# Patient Record
Sex: Male | Born: 1970 | Race: White | Hispanic: No | Marital: Married | Smoking: Current every day smoker
Health system: Southern US, Community
[De-identification: ages and names within clinical notes are randomized; demographics above are authoritative.]

## PROBLEM LIST (undated history)

## (undated) DIAGNOSIS — Z720 Tobacco use: Secondary | ICD-10-CM

## (undated) DIAGNOSIS — G2581 Restless legs syndrome: Secondary | ICD-10-CM

## (undated) DIAGNOSIS — I2510 Atherosclerotic heart disease of native coronary artery without angina pectoris: Secondary | ICD-10-CM

## (undated) DIAGNOSIS — I10 Essential (primary) hypertension: Secondary | ICD-10-CM

## (undated) DIAGNOSIS — I219 Acute myocardial infarction, unspecified: Secondary | ICD-10-CM

## (undated) DIAGNOSIS — E785 Hyperlipidemia, unspecified: Secondary | ICD-10-CM

## (undated) DIAGNOSIS — F419 Anxiety disorder, unspecified: Secondary | ICD-10-CM

## (undated) HISTORY — DX: Atherosclerotic heart disease of native coronary artery without angina pectoris: I25.10

## (undated) HISTORY — DX: Hyperlipidemia, unspecified: E78.5

---
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
## 2018-11-03 DIAGNOSIS — Z13220 Encounter for screening for lipoid disorders: Secondary | ICD-10-CM | POA: Diagnosis not present

## 2018-11-03 DIAGNOSIS — R030 Elevated blood-pressure reading, without diagnosis of hypertension: Secondary | ICD-10-CM | POA: Diagnosis not present

## 2018-11-03 DIAGNOSIS — F419 Anxiety disorder, unspecified: Secondary | ICD-10-CM | POA: Diagnosis not present

## 2018-11-03 DIAGNOSIS — R002 Palpitations: Secondary | ICD-10-CM | POA: Diagnosis not present

## 2018-11-03 DIAGNOSIS — R071 Chest pain on breathing: Secondary | ICD-10-CM | POA: Diagnosis not present

## 2018-11-03 DIAGNOSIS — R079 Chest pain, unspecified: Secondary | ICD-10-CM | POA: Diagnosis not present

## 2018-11-10 DIAGNOSIS — F419 Anxiety disorder, unspecified: Secondary | ICD-10-CM | POA: Diagnosis not present

## 2018-11-10 DIAGNOSIS — R7301 Impaired fasting glucose: Secondary | ICD-10-CM | POA: Diagnosis not present

## 2018-11-10 DIAGNOSIS — E785 Hyperlipidemia, unspecified: Secondary | ICD-10-CM | POA: Diagnosis not present

## 2018-11-21 ENCOUNTER — Emergency Department (HOSPITAL_COMMUNITY): Payer: Medicaid Other

## 2018-11-21 ENCOUNTER — Encounter (HOSPITAL_COMMUNITY)
Admission: EM | Disposition: A | Discharge status: Home / Self Care | Payer: Self-pay | Source: Home / Self Care | Attending: Thoracic Surgery (Cardiothoracic Vascular Surgery)

## 2018-11-21 ENCOUNTER — Encounter (HOSPITAL_COMMUNITY): Payer: Self-pay | Admitting: Cardiovascular Disease

## 2018-11-21 ENCOUNTER — Inpatient Hospital Stay (HOSPITAL_COMMUNITY)
Admission: EM | Admit: 2018-11-21 | Discharge: 2018-11-29 | DRG: 233 | Disposition: A | Discharge status: Home / Self Care | Payer: Medicaid Other | Attending: Thoracic Surgery (Cardiothoracic Vascular Surgery) | Admitting: Thoracic Surgery (Cardiothoracic Vascular Surgery)

## 2018-11-21 DIAGNOSIS — F17210 Nicotine dependence, cigarettes, uncomplicated: Secondary | ICD-10-CM | POA: Diagnosis present

## 2018-11-21 DIAGNOSIS — Z951 Presence of aortocoronary bypass graft: Secondary | ICD-10-CM | POA: Diagnosis not present

## 2018-11-21 DIAGNOSIS — J9600 Acute respiratory failure, unspecified whether with hypoxia or hypercapnia: Secondary | ICD-10-CM | POA: Diagnosis present

## 2018-11-21 DIAGNOSIS — Z978 Presence of other specified devices: Secondary | ICD-10-CM | POA: Diagnosis not present

## 2018-11-21 DIAGNOSIS — J9601 Acute respiratory failure with hypoxia: Secondary | ICD-10-CM | POA: Diagnosis not present

## 2018-11-21 DIAGNOSIS — I959 Hypotension, unspecified: Secondary | ICD-10-CM | POA: Diagnosis present

## 2018-11-21 DIAGNOSIS — I25110 Atherosclerotic heart disease of native coronary artery with unstable angina pectoris: Secondary | ICD-10-CM | POA: Diagnosis not present

## 2018-11-21 DIAGNOSIS — I2510 Atherosclerotic heart disease of native coronary artery without angina pectoris: Secondary | ICD-10-CM | POA: Diagnosis not present

## 2018-11-21 DIAGNOSIS — J9811 Atelectasis: Secondary | ICD-10-CM | POA: Diagnosis not present

## 2018-11-21 DIAGNOSIS — I469 Cardiac arrest, cause unspecified: Secondary | ICD-10-CM

## 2018-11-21 DIAGNOSIS — I472 Ventricular tachycardia: Secondary | ICD-10-CM | POA: Diagnosis present

## 2018-11-21 DIAGNOSIS — I4891 Unspecified atrial fibrillation: Secondary | ICD-10-CM | POA: Diagnosis not present

## 2018-11-21 DIAGNOSIS — F419 Anxiety disorder, unspecified: Secondary | ICD-10-CM | POA: Diagnosis present

## 2018-11-21 DIAGNOSIS — Z79899 Other long term (current) drug therapy: Secondary | ICD-10-CM | POA: Diagnosis not present

## 2018-11-21 DIAGNOSIS — I499 Cardiac arrhythmia, unspecified: Secondary | ICD-10-CM | POA: Diagnosis not present

## 2018-11-21 DIAGNOSIS — I480 Paroxysmal atrial fibrillation: Secondary | ICD-10-CM | POA: Diagnosis not present

## 2018-11-21 DIAGNOSIS — G2581 Restless legs syndrome: Secondary | ICD-10-CM | POA: Diagnosis present

## 2018-11-21 DIAGNOSIS — I213 ST elevation (STEMI) myocardial infarction of unspecified site: Secondary | ICD-10-CM

## 2018-11-21 DIAGNOSIS — J181 Lobar pneumonia, unspecified organism: Secondary | ICD-10-CM | POA: Diagnosis not present

## 2018-11-21 DIAGNOSIS — I214 Non-ST elevation (NSTEMI) myocardial infarction: Secondary | ICD-10-CM

## 2018-11-21 DIAGNOSIS — I462 Cardiac arrest due to underlying cardiac condition: Secondary | ICD-10-CM | POA: Diagnosis present

## 2018-11-21 DIAGNOSIS — D62 Acute posthemorrhagic anemia: Secondary | ICD-10-CM | POA: Diagnosis not present

## 2018-11-21 DIAGNOSIS — I4901 Ventricular fibrillation: Secondary | ICD-10-CM | POA: Diagnosis not present

## 2018-11-21 DIAGNOSIS — J90 Pleural effusion, not elsewhere classified: Secondary | ICD-10-CM | POA: Diagnosis not present

## 2018-11-21 DIAGNOSIS — R7303 Prediabetes: Secondary | ICD-10-CM | POA: Diagnosis present

## 2018-11-21 DIAGNOSIS — I2583 Coronary atherosclerosis due to lipid rich plaque: Secondary | ICD-10-CM | POA: Diagnosis not present

## 2018-11-21 DIAGNOSIS — Z20828 Contact with and (suspected) exposure to other viral communicable diseases: Secondary | ICD-10-CM | POA: Diagnosis not present

## 2018-11-21 DIAGNOSIS — E7800 Pure hypercholesterolemia, unspecified: Secondary | ICD-10-CM | POA: Diagnosis not present

## 2018-11-21 DIAGNOSIS — Z09 Encounter for follow-up examination after completed treatment for conditions other than malignant neoplasm: Secondary | ICD-10-CM

## 2018-11-21 DIAGNOSIS — Z4682 Encounter for fitting and adjustment of non-vascular catheter: Secondary | ICD-10-CM | POA: Diagnosis not present

## 2018-11-21 DIAGNOSIS — E8770 Fluid overload, unspecified: Secondary | ICD-10-CM | POA: Diagnosis not present

## 2018-11-21 DIAGNOSIS — R4020 Unspecified coma: Secondary | ICD-10-CM | POA: Diagnosis not present

## 2018-11-21 DIAGNOSIS — R404 Transient alteration of awareness: Secondary | ICD-10-CM | POA: Diagnosis not present

## 2018-11-21 DIAGNOSIS — Z01810 Encounter for preprocedural cardiovascular examination: Secondary | ICD-10-CM | POA: Diagnosis not present

## 2018-11-21 DIAGNOSIS — R739 Hyperglycemia, unspecified: Secondary | ICD-10-CM | POA: Diagnosis not present

## 2018-11-21 DIAGNOSIS — J939 Pneumothorax, unspecified: Secondary | ICD-10-CM

## 2018-11-21 DIAGNOSIS — R079 Chest pain, unspecified: Secondary | ICD-10-CM | POA: Diagnosis not present

## 2018-11-21 HISTORY — PX: LEFT HEART CATH AND CORONARY ANGIOGRAPHY: CATH118249

## 2018-11-21 HISTORY — DX: Restless legs syndrome: G25.81

## 2018-11-21 HISTORY — DX: Tobacco use: Z72.0

## 2018-11-21 LAB — URINALYSIS, ROUTINE W REFLEX MICROSCOPIC
Bilirubin Urine: NEGATIVE
Glucose, UA: >=500 mg/dL — AB
Ketones, ur: 5 mg/dL — AB
Leukocytes,Ua: NEGATIVE
Nitrite: NEGATIVE
Protein, ur: >=300 mg/dL — AB
Specific Gravity, Urine: 1.013 (ref 1.005–1.030)
pH: 5 (ref 5.0–8.0)

## 2018-11-21 LAB — CBC WITH DIFFERENTIAL/PLATELET
Abs Immature Granulocytes: 0.49 10*3/uL — ABNORMAL HIGH (ref 0.00–0.07)
Basophils Absolute: 0.1 10*3/uL (ref 0.0–0.1)
Basophils Relative: 0 %
Eosinophils Absolute: 0.4 10*3/uL (ref 0.0–0.5)
Eosinophils Relative: 2 %
HCT: 49.9 % (ref 39.0–52.0)
Hemoglobin: 16.1 g/dL (ref 13.0–17.0)
Immature Granulocytes: 3 %
Lymphocytes Relative: 23 %
Lymphs Abs: 4.5 10*3/uL — ABNORMAL HIGH (ref 0.7–4.0)
MCH: 29 pg (ref 26.0–34.0)
MCHC: 32.3 g/dL (ref 30.0–36.0)
MCV: 89.7 fL (ref 80.0–100.0)
Monocytes Absolute: 1 10*3/uL (ref 0.1–1.0)
Monocytes Relative: 5 %
Neutro Abs: 13.4 10*3/uL — ABNORMAL HIGH (ref 1.7–7.7)
Neutrophils Relative %: 67 %
Platelets: 307 10*3/uL (ref 150–400)
RBC: 5.56 MIL/uL (ref 4.22–5.81)
RDW: 14.6 % (ref 11.5–15.5)
WBC: 19.8 10*3/uL — ABNORMAL HIGH (ref 4.0–10.5)
nRBC: 0 % (ref 0.0–0.2)

## 2018-11-21 LAB — COMPREHENSIVE METABOLIC PANEL
ALT: 49 U/L — ABNORMAL HIGH (ref 0–44)
AST: 50 U/L — ABNORMAL HIGH (ref 15–41)
Albumin: 3.4 g/dL — ABNORMAL LOW (ref 3.5–5.0)
Alkaline Phosphatase: 58 U/L (ref 38–126)
Anion gap: 17 — ABNORMAL HIGH (ref 5–15)
BUN: 23 mg/dL — ABNORMAL HIGH (ref 6–20)
CO2: 13 mmol/L — ABNORMAL LOW (ref 22–32)
Calcium: 8.1 mg/dL — ABNORMAL LOW (ref 8.9–10.3)
Chloride: 109 mmol/L (ref 98–111)
Creatinine, Ser: 1.48 mg/dL — ABNORMAL HIGH (ref 0.61–1.24)
GFR calc Af Amer: >60 mL/min (ref >60)
GFR calc non Af Amer: 55 mL/min — ABNORMAL LOW (ref >60)
Glucose, Bld: 273 mg/dL — ABNORMAL HIGH (ref 70–99)
Potassium: 3.6 mmol/L (ref 3.5–5.1)
Sodium: 139 mmol/L (ref 135–145)
Total Bilirubin: 0.6 mg/dL (ref 0.3–1.2)
Total Protein: 6.1 g/dL — ABNORMAL LOW (ref 6.5–8.1)

## 2018-11-21 LAB — GLUCOSE, CAPILLARY: Glucose-Capillary: 300 mg/dL — ABNORMAL HIGH (ref 70–99)

## 2018-11-21 LAB — POCT I-STAT 7, (LYTES, BLD GAS, ICA,H+H)
Acid-base deficit: 9 mmol/L — ABNORMAL HIGH (ref 0.0–2.0)
Bicarbonate: 16.8 mmol/L — ABNORMAL LOW (ref 20.0–28.0)
Calcium, Ion: 1.1 mmol/L — ABNORMAL LOW (ref 1.15–1.40)
HCT: 44 % (ref 39.0–52.0)
Hemoglobin: 15 g/dL (ref 13.0–17.0)
O2 Saturation: 100 %
Patient temperature: 94.8
Potassium: 3.4 mmol/L — ABNORMAL LOW (ref 3.5–5.1)
Sodium: 142 mmol/L (ref 135–145)
TCO2: 18 mmol/L — ABNORMAL LOW (ref 22–32)
pCO2 arterial: 31 mmHg — ABNORMAL LOW (ref 32.0–48.0)
pH, Arterial: 7.333 — ABNORMAL LOW (ref 7.350–7.450)
pO2, Arterial: 270 mmHg — ABNORMAL HIGH (ref 83.0–108.0)

## 2018-11-21 LAB — I-STAT CHEM 8, ED
BUN: 25 mg/dL — ABNORMAL HIGH (ref 6–20)
Calcium, Ion: 1.06 mmol/L — ABNORMAL LOW (ref 1.15–1.40)
Chloride: 108 mmol/L (ref 98–111)
Creatinine, Ser: 1.2 mg/dL (ref 0.61–1.24)
Glucose, Bld: 269 mg/dL — ABNORMAL HIGH (ref 70–99)
HCT: 49 % (ref 39.0–52.0)
Hemoglobin: 16.7 g/dL (ref 13.0–17.0)
Potassium: 3.5 mmol/L (ref 3.5–5.1)
Sodium: 141 mmol/L (ref 135–145)
TCO2: 17 mmol/L — ABNORMAL LOW (ref 22–32)

## 2018-11-21 LAB — LACTIC ACID, PLASMA
Lactic Acid, Venous: 8.2 mmol/L (ref 0.5–1.9)
Lactic Acid, Venous: 5.7 mmol/L (ref 0.5–1.9)

## 2018-11-21 LAB — SARS CORONAVIRUS 2 BY RT PCR (HOSPITAL ORDER, PERFORMED IN ~~LOC~~ HOSPITAL LAB): SARS Coronavirus 2: NEGATIVE

## 2018-11-21 LAB — TROPONIN I (HIGH SENSITIVITY): Troponin I (High Sensitivity): 23 ng/L — ABNORMAL HIGH (ref <18)

## 2018-11-21 SURGERY — LEFT HEART CATH AND CORONARY ANGIOGRAPHY
Anesthesia: LOCAL

## 2018-11-21 MED ORDER — DEXTROSE 5 % IV SOLN
INTRAVENOUS | Status: AC | PRN
  Administered 2018-11-21: 150 mg via INTRAVENOUS

## 2018-11-21 MED ORDER — PANTOPRAZOLE SODIUM 40 MG IV SOLR
40.0000 mg | Freq: Every day | INTRAVENOUS | Status: DC
  Administered 2018-11-22: 40 mg via INTRAVENOUS
  Filled 2018-11-21: qty 40

## 2018-11-21 MED ORDER — HEPARIN (PORCINE) 25000 UT/250ML-% IV SOLN
1100.0000 [IU]/h | INTRAVENOUS | Status: DC
  Administered 2018-11-21: 1100 [IU]/h via INTRAVENOUS
  Filled 2018-11-21: qty 250

## 2018-11-21 MED ORDER — IOHEXOL 350 MG/ML SOLN
INTRAVENOUS | Status: DC | PRN
  Administered 2018-11-21: 21:00:00 95 mL via INTRAVENOUS

## 2018-11-21 MED ORDER — ROCURONIUM BROMIDE 50 MG/5ML IV SOLN
INTRAVENOUS | Status: AC | PRN
  Administered 2018-11-21: 100 mg via INTRAVENOUS

## 2018-11-21 MED ORDER — HEPARIN BOLUS VIA INFUSION
4000.0000 [IU] | Freq: Once | INTRAVENOUS | Status: AC
  Administered 2018-11-21: 4000 [IU] via INTRAVENOUS
  Filled 2018-11-21: qty 4000

## 2018-11-21 MED ORDER — HEPARIN (PORCINE) IN NACL 1000-0.9 UT/500ML-% IV SOLN
INTRAVENOUS | Status: AC
  Filled 2018-11-21: qty 1500

## 2018-11-21 MED ORDER — ORAL CARE MOUTH RINSE
15.0000 mL | OROMUCOSAL | Status: DC
  Administered 2018-11-22 (×3): 15 mL via OROMUCOSAL

## 2018-11-21 MED ORDER — SODIUM CHLORIDE 0.9 % IV BOLUS
1000.0000 mL | Freq: Once | INTRAVENOUS | Status: AC
  Administered 2018-11-21: 20:00:00 1000 mL via INTRAVENOUS

## 2018-11-21 MED ORDER — SODIUM CHLORIDE 0.9% FLUSH: 3.0000 mL | INTRAVENOUS | Status: DC | PRN

## 2018-11-21 MED ORDER — MIDAZOLAM HCL 2 MG/2ML IJ SOLN
2.0000 mg | INTRAMUSCULAR | Status: DC | PRN
  Administered 2018-11-21 (×2): 2 mg via INTRAVENOUS
  Filled 2018-11-21 (×4): qty 2

## 2018-11-21 MED ORDER — MIDAZOLAM HCL 2 MG/2ML IJ SOLN
2.0000 mg | INTRAMUSCULAR | Status: DC | PRN
  Administered 2018-11-21 – 2018-11-22 (×3): 2 mg via INTRAVENOUS

## 2018-11-21 MED ORDER — INSULIN ASPART 100 UNIT/ML ~~LOC~~ SOLN
0.0000 [IU] | SUBCUTANEOUS | Status: DC
  Administered 2018-11-22: 5 [IU] via SUBCUTANEOUS

## 2018-11-21 MED ORDER — MIDAZOLAM HCL 2 MG/2ML IJ SOLN
2.0000 mg | INTRAMUSCULAR | Status: DC | PRN
  Filled 2018-11-21: qty 2

## 2018-11-21 MED ORDER — HEPARIN SODIUM (PORCINE) 1000 UNIT/ML IJ SOLN
INTRAMUSCULAR | Status: DC | PRN
  Administered 2018-11-21: 8000 [IU] via INTRAVENOUS

## 2018-11-21 MED ORDER — SODIUM CHLORIDE 0.9 % IV SOLN
INTRAVENOUS | Status: DC | PRN
  Administered 2018-11-21: 10 mL/h via INTRAVENOUS

## 2018-11-21 MED ORDER — FENTANYL CITRATE (PF) 100 MCG/2ML IJ SOLN
INTRAMUSCULAR | Status: AC | PRN
  Administered 2018-11-21 (×2): 50 ug via INTRAVENOUS

## 2018-11-21 MED ORDER — LABETALOL HCL 5 MG/ML IV SOLN: 10.0000 mg | INTRAVENOUS | Status: AC | PRN

## 2018-11-21 MED ORDER — FENTANYL CITRATE (PF) 100 MCG/2ML IJ SOLN: 50.0000 ug | Freq: Once | INTRAMUSCULAR | Status: DC

## 2018-11-21 MED ORDER — MIDAZOLAM HCL 2 MG/2ML IJ SOLN: 2.0000 mg | INTRAMUSCULAR | Status: DC | PRN

## 2018-11-21 MED ORDER — SODIUM CHLORIDE 0.9 % IV SOLN: 250.0000 mL | INTRAVENOUS | Status: DC | PRN

## 2018-11-21 MED ORDER — AMIODARONE HCL IN DEXTROSE 360-4.14 MG/200ML-% IV SOLN: 30.0000 mg/h | INTRAVENOUS | Status: DC

## 2018-11-21 MED ORDER — AMIODARONE HCL IN DEXTROSE 360-4.14 MG/200ML-% IV SOLN
INTRAVENOUS | Status: AC
  Filled 2018-11-21: qty 200

## 2018-11-21 MED ORDER — METOPROLOL TARTRATE 5 MG/5ML IV SOLN: 2.5000 mg | Freq: Three times a day (TID) | INTRAVENOUS | Status: DC

## 2018-11-21 MED ORDER — SODIUM CHLORIDE 0.9 % IV SOLN: INTRAVENOUS | Status: AC

## 2018-11-21 MED ORDER — FENTANYL 2500MCG IN NS 250ML (10MCG/ML) PREMIX INFUSION
25.0000 ug/h | INTRAVENOUS | Status: DC
  Administered 2018-11-21: 20:00:00 50 ug/h via INTRAVENOUS
  Filled 2018-11-21: qty 250

## 2018-11-21 MED ORDER — AMIODARONE HCL IN DEXTROSE 360-4.14 MG/200ML-% IV SOLN
60.0000 mg/h | INTRAVENOUS | Status: AC
  Administered 2018-11-21: 60 mg/h via INTRAVENOUS

## 2018-11-21 MED ORDER — CHLORHEXIDINE GLUCONATE 0.12% ORAL RINSE (MEDLINE KIT)
15.0000 mL | Freq: Two times a day (BID) | OROMUCOSAL | Status: DC
  Administered 2018-11-22 – 2018-11-24 (×5): 15 mL via OROMUCOSAL

## 2018-11-21 MED ORDER — FENTANYL BOLUS VIA INFUSION
50.0000 ug | INTRAVENOUS | Status: DC | PRN
  Filled 2018-11-21: qty 50

## 2018-11-21 MED ORDER — ASPIRIN 81 MG PO CHEW
81.0000 mg | CHEWABLE_TABLET | Freq: Every day | ORAL | Status: DC
  Administered 2018-11-22 – 2018-11-24 (×3): 81 mg via ORAL
  Filled 2018-11-21 (×3): qty 1

## 2018-11-21 MED ORDER — VERAPAMIL HCL 2.5 MG/ML IV SOLN
INTRAVENOUS | Status: AC
  Filled 2018-11-21: qty 2

## 2018-11-21 MED ORDER — CANGRELOR TETRASODIUM 50 MG IV SOLR
INTRAVENOUS | Status: AC
  Filled 2018-11-21: qty 50

## 2018-11-21 MED ORDER — ACETAMINOPHEN 325 MG PO TABS
650.0000 mg | ORAL_TABLET | ORAL | Status: DC | PRN
  Administered 2018-11-22 – 2018-11-23 (×2): 650 mg via ORAL
  Filled 2018-11-21 (×2): qty 2

## 2018-11-21 MED ORDER — LIDOCAINE HCL (PF) 1 % IJ SOLN
INTRAMUSCULAR | Status: AC
  Filled 2018-11-21: qty 30

## 2018-11-21 MED ORDER — ATORVASTATIN CALCIUM 80 MG PO TABS
80.0000 mg | ORAL_TABLET | Freq: Every day | ORAL | Status: DC
  Administered 2018-11-22 – 2018-11-28 (×7): 80 mg via ORAL
  Filled 2018-11-21 (×7): qty 1

## 2018-11-21 MED ORDER — BISACODYL 10 MG RE SUPP: 10.0000 mg | Freq: Every day | RECTAL | Status: DC | PRN

## 2018-11-21 MED ORDER — ACETAMINOPHEN 325 MG PO TABS: 650.0000 mg | ORAL_TABLET | ORAL | Status: DC | PRN

## 2018-11-21 MED ORDER — AMIODARONE LOAD VIA INFUSION
INTRAVENOUS | Status: DC | PRN
  Administered 2018-11-21: 21:00:00 150 mg via INTRAVENOUS

## 2018-11-21 MED ORDER — HEPARIN (PORCINE) 25000 UT/250ML-% IV SOLN
1600.0000 [IU]/h | INTRAVENOUS | Status: DC
  Administered 2018-11-22 – 2018-11-23 (×3): 1100 [IU]/h via INTRAVENOUS
  Administered 2018-11-24: 20:00:00 1500 [IU]/h via INTRAVENOUS
  Filled 2018-11-21 (×4): qty 250

## 2018-11-21 MED ORDER — ASPIRIN 300 MG RE SUPP: 300.0000 mg | RECTAL | Status: DC

## 2018-11-21 MED ORDER — LIDOCAINE HCL (PF) 1 % IJ SOLN
INTRAMUSCULAR | Status: DC | PRN
  Administered 2018-11-21: 5 mL

## 2018-11-21 MED ORDER — NITROGLYCERIN 0.4 MG SL SUBL: 0.4000 mg | SUBLINGUAL_TABLET | SUBLINGUAL | Status: DC | PRN

## 2018-11-21 MED ORDER — MIDAZOLAM HCL 2 MG/2ML IJ SOLN
INTRAMUSCULAR | Status: AC
  Filled 2018-11-21: qty 2

## 2018-11-21 MED ORDER — PROPOFOL 1000 MG/100ML IV EMUL
INTRAVENOUS | Status: AC
  Filled 2018-11-21: qty 100

## 2018-11-21 MED ORDER — ONDANSETRON HCL 4 MG/2ML IJ SOLN: 4.0000 mg | Freq: Four times a day (QID) | INTRAMUSCULAR | Status: DC | PRN

## 2018-11-21 MED ORDER — SODIUM CHLORIDE 0.9 % IV SOLN
INTRAVENOUS | Status: AC | PRN
  Administered 2018-11-21: 1000 mL via INTRAVENOUS

## 2018-11-21 MED ORDER — FENTANYL CITRATE (PF) 100 MCG/2ML IJ SOLN
INTRAMUSCULAR | Status: AC
  Filled 2018-11-21: qty 2

## 2018-11-21 MED ORDER — ETOMIDATE 2 MG/ML IV SOLN
INTRAVENOUS | Status: AC | PRN
  Administered 2018-11-21: 20 mg via INTRAVENOUS

## 2018-11-21 MED ORDER — ASPIRIN EC 81 MG PO TBEC: 81.0000 mg | DELAYED_RELEASE_TABLET | Freq: Every day | ORAL | Status: DC

## 2018-11-21 MED ORDER — SENNOSIDES 8.8 MG/5ML PO SYRP: 5.0000 mL | ORAL_SOLUTION | Freq: Two times a day (BID) | ORAL | Status: DC | PRN

## 2018-11-21 MED ORDER — AMIODARONE HCL 150 MG/3ML IV SOLN
INTRAVENOUS | Status: AC
  Filled 2018-11-21: qty 3

## 2018-11-21 MED ORDER — METOPROLOL TARTRATE 25 MG PO TABS: 25.0000 mg | ORAL_TABLET | Freq: Two times a day (BID) | ORAL | Status: DC

## 2018-11-21 MED ORDER — SODIUM CHLORIDE 0.9 % IV SOLN
INTRAVENOUS | Status: DC
  Administered 2018-11-22: 75 mL/h via INTRAVENOUS

## 2018-11-21 MED ORDER — SODIUM CHLORIDE 0.9% FLUSH
3.0000 mL | Freq: Two times a day (BID) | INTRAVENOUS | Status: DC
  Administered 2018-11-22 – 2018-11-23 (×4): 3 mL via INTRAVENOUS

## 2018-11-21 MED ORDER — FENTANYL 2500MCG IN NS 250ML (10MCG/ML) PREMIX INFUSION
50.0000 ug/h | INTRAVENOUS | Status: DC
  Administered 2018-11-22: 100 ug/h via INTRAVENOUS
  Filled 2018-11-21: qty 250

## 2018-11-21 MED ORDER — FENTANYL CITRATE (PF) 100 MCG/2ML IJ SOLN
50.0000 ug | Freq: Once | INTRAMUSCULAR | Status: AC
  Administered 2018-11-21: 50 ug via INTRAVENOUS

## 2018-11-21 MED ORDER — CANGRELOR BOLUS VIA INFUSION
INTRAVENOUS | Status: DC | PRN
  Administered 2018-11-21: 2721 ug via INTRAVENOUS

## 2018-11-21 MED ORDER — VERAPAMIL HCL 2.5 MG/ML IV SOLN
INTRAVENOUS | Status: DC | PRN
  Administered 2018-11-21: 21:00:00 10 mL via INTRA_ARTERIAL

## 2018-11-21 MED ORDER — ASPIRIN 81 MG PO CHEW
324.0000 mg | CHEWABLE_TABLET | ORAL | Status: DC
  Administered 2018-11-22: 324 mg via ORAL

## 2018-11-21 MED ORDER — HYDRALAZINE HCL 20 MG/ML IJ SOLN: 10.0000 mg | INTRAMUSCULAR | Status: AC | PRN

## 2018-11-21 MED ORDER — PROPOFOL 1000 MG/100ML IV EMUL
5.0000 ug/kg/min | INTRAVENOUS | Status: DC
  Administered 2018-11-21: 5 ug/kg/min via INTRAVENOUS

## 2018-11-21 MED ORDER — METOPROLOL TARTRATE 5 MG/5ML IV SOLN
2.5000 mg | Freq: Four times a day (QID) | INTRAVENOUS | Status: DC
  Administered 2018-11-22 – 2018-11-23 (×5): 2.5 mg via INTRAVENOUS
  Filled 2018-11-21 (×5): qty 5

## 2018-11-21 MED ORDER — HEPARIN SODIUM (PORCINE) 1000 UNIT/ML IJ SOLN
INTRAMUSCULAR | Status: AC
  Filled 2018-11-21: qty 1

## 2018-11-21 SURGICAL SUPPLY — 11 items
CATH 5FR JL3.5 JR4 ANG PIG MP (CATHETERS) ×2 IMPLANT
DEVICE RAD COMP TR BAND LRG (VASCULAR PRODUCTS) ×2 IMPLANT
GUIDEWIRE INQWIRE 1.5J.035X260 (WIRE) ×1 IMPLANT
INQWIRE 1.5J .035X260CM (WIRE) ×2
KIT ENCORE 26 ADVANTAGE (KITS) ×2 IMPLANT
KIT HEART LEFT (KITS) ×2 IMPLANT
PACK CARDIAC CATHETERIZATION (CUSTOM PROCEDURE TRAY) ×2 IMPLANT
SHEATH RAIN RADIAL 21G 6FR (SHEATH) ×6 IMPLANT
SYR MEDRAD MARK 7 150ML (SYRINGE) ×2 IMPLANT
TRANSDUCER W/STOPCOCK (MISCELLANEOUS) ×2 IMPLANT
TUBING CIL FLEX 10 FLL-RA (TUBING) ×2 IMPLANT

## 2018-11-21 NOTE — ED Triage Notes (Addendum)
Per [employer]. Called for unresponsive pt, found in Vfib - defib. At 360 X1.  Gave a total of 1 lido, 1 epi.  Down time was approx. 25 min.  Given 900 ns .  Arrived w/ [facility] airway.  Only hx depression and anxiety.

## 2018-11-21 NOTE — ED Provider Notes (Signed)
[facility] EMERGENCY DEPARTMENT Provider Note   CSN: [id number] Arrival date & time: 11/21/18  7680     History   Chief Complaint Chief Complaint  Patient presents with   post CPR    HPI George Munoz is a 48 y.o. male here with post CPR. Patient was having chest pain earlier in the day.  When EMS was called, patient was noted to be in rapid A. fib initially.  He never has a history of A. fib.  While in the ambulance, patient apparently decompensated and went into V. fib.  He was shocked once and was given 1 round of epi and round of lidocaine bolus.  Total CPR time was around 25 minutes. Patient had a King airway on arrival.  Patient had some spontaneous movements and had a very thready pulse with a heart rate in the 170s.      The history is provided by the EMS personnel.    No past medical history on file.  There are no active problems to display for this patient.      Home Medications    Prior to Admission medications   Not on File    Family History No family history on file.  Social History Social History   Tobacco Use   Smoking status: Not on file  Substance Use Topics   Alcohol use: Not on file   Drug use: Not on file     Allergies   Patient has no allergy information on record.   Review of Systems Review of Systems  Cardiovascular: Positive for chest pain and palpitations.  All other systems reviewed and are negative.    Physical Exam Updated Vital Signs BP 96/60    Pulse (!) 144    Temp (!) 95.8 F (35.4 C) (Tympanic)    Resp 17    Ht 5\' 8"  (1.727 m)    Wt 90.7 kg    SpO2 96%    BMI 30.41 kg/m   Physical Exam Vitals signs and nursing note reviewed.  Constitutional:      Comments: Altered, some spontaneous movements   HENT:     Head: Normocephalic.     Nose: Nose normal.     Mouth/Throat:     Mouth: Mucous membranes are moist.  Eyes:     Extraocular Movements: Extraocular movements intact.     Pupils:  Pupils are equal, round, and reactive to light.  Neck:     Musculoskeletal: Normal range of motion.  Cardiovascular:     Comments: Rapid, irregular  Pulmonary:     Comments: Diminished bilateral bases  Abdominal:     General: Abdomen is flat.  Musculoskeletal: Normal range of motion.  Skin:    General: Skin is warm.     Capillary Refill: Capillary refill takes less than 2 seconds.  Neurological:     Comments: Confused, moving all extremities, not following commands   Psychiatric:     Comments: Unable       ED Treatments / Results  Labs (all labs ordered are listed, but only abnormal results are displayed) Labs Reviewed  SARS CORONAVIRUS 2 (HOSPITAL ORDER, PERFORMED IN [facility])  CULTURE, BLOOD (ROUTINE X 2)  CULTURE, BLOOD (ROUTINE X 2)  URINE CULTURE  CBC WITH DIFFERENTIAL/PLATELET  COMPREHENSIVE METABOLIC PANEL  LACTIC ACID, PLASMA  LACTIC ACID, PLASMA  URINALYSIS, ROUTINE W REFLEX MICROSCOPIC  I-STAT CHEM 8, ED  TROPONIN I (HIGH SENSITIVITY)    EKG EKG Interpretation  Date/Time:  Sunday November 21 2018 19:20:18 EDT Ventricular Rate:  138 PR Interval:    QRS Duration: 101 QT Interval:  347 QTC Calculation: 526 R Axis:   78 Text Interpretation:  Atrial fibrillation Inferoposterior infarct, acute (RCA) Lateral infarct, acute Prolonged QT interval Probable RV involvement, suggest recording right precordial leads No previous ECGs available There is rate related changes  Confirmed by Wandra Arthurs [phone]) on 11/21/2018 7:35:14 PM   Radiology No results found.  Procedures Procedures (including critical care time)  INTUBATION Performed by: Wandra Arthurs  Required items: required blood products, implants, devices, and special equipment available Patient identity confirmed: provided demographic data and hospital-assigned identification number Time out: Immediately prior to procedure a "time out" was called to verify the correct patient, procedure,  equipment, support staff and site/side marked as required.  Indications: post CPR  Intubation method: Glidescope Laryngoscopy   Preoxygenation: BVM  Sedatives: 20 mg Etomidate Paralytic: 100 mg rocuronium  Tube Size: 7.5 cuffed  Post-procedure assessment: chest rise and ETCO2 monitor Breath sounds: equal and absent over the epigastrium Tube secured with: ETT holder Chest x-ray interpreted by radiologist and me.  Chest x-ray findings: endotracheal tube in appropriate position  Patient tolerated the procedure well with no immediate complications.  CRITICAL CARE Performed by: Wandra Arthurs   Total critical care time: 40 minutes  Critical care time was exclusive of separately billable procedures and treating other patients.  Critical care was necessary to treat or prevent imminent or life-threatening deterioration.  Critical care was time spent personally by me on the following activities: development of treatment plan with patient and/or surrogate as well as nursing, discussions with consultants, evaluation of patient's response to treatment, examination of patient, obtaining history from patient or surrogate, ordering and performing treatments and interventions, ordering and review of laboratory studies, ordering and review of radiographic studies, pulse oximetry and re-evaluation of patient's condition.    Medications Ordered in ED Medications  0.9 %  sodium chloride infusion (1,000 mLs Intravenous New Bag/Given 11/21/18 1925)  amiodarone (CORDARONE) 150 mg in dextrose 5 % 100 mL bolus (150 mg Intravenous New Bag/Given 11/21/18 0725)  fentaNYL (SUBLIMAZE) 100 MCG/2ML injection (has no administration in time range)  etomidate (AMIDATE) injection (20 mg Intravenous Given 11/21/18 1928)  fentaNYL (SUBLIMAZE) injection (50 mcg Intravenous Given 11/21/18 1934)  rocuronium (ZEMURON) injection (100 mg Intravenous Given 11/21/18 1929)  amiodarone (NEXTERONE PREMIX) 360-4.14 MG/200ML-%  (1.8 mg/mL) IV infusion (has no administration in time range)  fentaNYL (SUBLIMAZE) injection 50 mcg (has no administration in time range)  fentaNYL 2534mcg in NS 232mL (28mcg/ml) infusion-PREMIX (has no administration in time range)  fentaNYL (SUBLIMAZE) bolus via infusion 50 mcg (has no administration in time range)  midazolam (VERSED) injection 2 mg (has no administration in time range)  midazolam (VERSED) injection 2 mg (has no administration in time range)  amiodarone (NEXTERONE PREMIX) 360-4.14 MG/200ML-% (1.8 mg/mL) IV infusion (has no administration in time range)  amiodarone (NEXTERONE PREMIX) 360-4.14 MG/200ML-% (1.8 mg/mL) IV infusion (has no administration in time range)     Initial Impression / Assessment and Plan / ED Course  I have reviewed the triage vital signs and the nursing notes.  Pertinent labs & imaging results that were available during my care of the patient were reviewed by me and considered in my medical decision making (see chart for details).        Evonte Prestage is a 48 y.o. male here presenting with chest pain, rapid afib, V fib.  Patient initially had chest pain and then developed rapid A. fib and then lost pulses and went into V. fib and was shocked and CPR for 25 minutes.  Patient had some spontaneous movements but that are not purposeful in the ED.  Patient appears to be rapid A. fib and appears to have a inferior STEMI .  I talked to cardiology and code STEMI was activated.  Due to concern for possible V. fib, I started the patient on amiodarone bolus and drip.  However his blood pressure remained low so he was giving some IV bolus. Cardiology will take patient emergently to cath lab. Critical care to help manage the vent and likely will need to cool patient.   Final Clinical Impressions(s) / ED Diagnoses   Final diagnoses:  None    ED Discharge Orders    None       Charlynne PanderYao, Quantina Dershem Hsienta, MD 11/21/18 [phone]

## 2018-11-21 NOTE — Progress Notes (Signed)
eLink Physician-Brief Progress Note Patient Name: George Munoz DOB: [birth year] MRN: [id number]   Date of Service  11/21/2018  HPI/Events of Note  48 y/o M experienced chest pain then became unresponsive and bystander CPR ensued. Afib then vfib, inferior STEMI on EKG. LHC with multivessel disease. Afib RVR requiring amiodarone.  eICU Interventions   Remains intubated post cath  CTS consulted for possible surgical revascularization  Patient with purposeful movements requiring sedation. Will not need TTM.     Intervention Category Major Interventions: Arrhythmia - evaluation and management;Respiratory failure - evaluation and management Minor Interventions: Agitation / anxiety - evaluation and management Evaluation Type: New Patient Evaluation  Judd Lien 11/21/2018, 11:36 PM

## 2018-11-21 NOTE — Progress Notes (Signed)
ANTICOAGULATION CONSULT NOTE - Initial Consult  Pharmacy Consult for heparin Indication: chest pain/ACS  Not on File  Patient Measurements: Height: 5\' 8"  (172.7 cm) Weight: 200 lb (90.7 kg) IBW/kg (Calculated) : 68.4 Heparin Dosing Weight: 87.1kg  Vital Signs: Temp: 95.8 F (35.4 C) (07/26 1927) Temp Source: Tympanic (07/26 1927) BP: 96/60 (07/26 1936) Pulse Rate: 144 (07/26 1938)  Labs: Recent Labs    11/21/18 1933 11/21/18 1939  HGB 16.1 16.7  HCT 49.9 49.0  PLT 307  --   CREATININE  --  1.20    Estimated Creatinine Clearance: 82.3 mL/min (by C-G formula based on SCr of 1.2 mg/dL).   Medical History: No past medical history on file.  Medications:  Infusions:  . amiodarone    . amiodarone Stopped (11/21/18 1947)  . [START ON 11/22/2018] amiodarone    . fentaNYL infusion INTRAVENOUS    . heparin 1,100 Units/hr (11/21/18 1951)    Assessment: 62 yom presented to the ED s/p CPR. Code STEMI called in the ED. To start IV heparin. Baseline CBC is WNL and he not on any known anticoagulation PTA. Pending possible cardiac cath.   Goal of Therapy:  Heparin level 0.3-0.7 units/ml Monitor platelets by anticoagulation protocol: Yes   Plan:  Heparin bolus 4000 units IV x 1 Heparin gtt 1100 units/hr Check a 6 hr heparin level vs f/u post-cath Daily heparin level and CBC  George Munoz, George Munoz 11/21/2018,7:45 PM

## 2018-11-21 NOTE — ED Notes (Signed)
RN removed IO

## 2018-11-21 NOTE — Consult Note (Signed)
NAME:  George Munoz, MRN:  [id number], DOB:  Oct 13, 1970, LOS: 0 ADMISSION DATE:  11/21/2018, CONSULTATION DATE:  7/26 REFERRING MD: Silverio Lay - ED, CHIEF COMPLAINT:  S/p cardiac arrest   Brief History   48 yo M s/p VFib arrest with ROSC. Went to cath lab, multivessel disease however no acute thrombus   PCCM consulted for vent  History of present illness   History obtained from chart review  48 yo M PMH anxiety, depression who presents 7/26 via [facility]. Ems was dispatched for chest pain. Upon EMS arrival, patient found to be in rapid Atrial fibrillation. En route, patient acutely decompensated. He became unresponsive and rhythm was noted to be Vfib He received 1x defib at 360, received 1 epi, 1 bolus of lido, as well as CPR.  King airway placed by EMS. ROSC achieved after estimated 25 minutes.   In ED, patient reportedly with some spontaneous movement. Brooke Dare exchanged for ETT in ED. Code STEMI activated. Patient to cath lab, noted to have multivessel disease but no acute thrombus.   PCCM consulted for vent management   Past Medical History  Depression Anxiety   Significant Hospital Events   7/26> Vfib arrest, 1x DF, 1x epi, 1x lido, CPR. ROSC after 25 minutes. Intubated. Cath lab reveals no acute thrombus but shows multivessel disease.   Consults:  PCCM   Procedures:  7/26 ETT>>> 7/26 Cardiac cath>>>  Significant Diagnostic Tests:  7/26 CXR> ETT tip below thoracic inlet. L infrahilar opacity.   Micro Data:  7/26 SARS CoV2> negative   Antimicrobials:    Interim history/subjective:  Patient from ED to cath lab.  HDS in cath lab, remains intubated. Will go to CVICU   Objective   Blood pressure 123/88, pulse (!) 128, temperature (!) 94.7 F (34.8 C), resp. rate (!) 0, height 5\' 8"  (1.727 m), weight 90.7 kg, SpO2 (!) 0 %.    Vent Mode: PRVC FiO2 (%):  [100 %] 100 % Set Rate:  [18 bmp] 18 bmp Vt Set:  [550 mL] 550 mL PEEP:  [5 cmH20] 5 cmH20 Plateau Pressure:  [18  cmH20] 18 cmH20   Intake/Output Summary (Last 24 hours) at 11/21/2018 2106 Last data filed at 11/21/2018 2018 Gross per 24 hour  Intake 1000 ml  Output -  Net 1000 ml   Filed Weights   11/21/18 1934  Weight: 90.7 kg    Examination: General: Chronically and critically ill appearing adult male, appears older than stated age, intubated, sedated in appearance (sedation discontinued at time of exam)  HENT: NCAT, trachea midline, ETT OGT secure. Pink mmm. Anicteric sclera  Lungs: L crackles. Symmetrical chest expansion. No accessory muscle recruitment. Cardiovascular: IRIR. s1s2 no rgm. Capillary refill < 3 seconds  Abdomen: Soft, round, ndnt. + bowel sounds  Extremities: Symmetrical bulk and tone. No cyanosis, no clubbing  Neuro: Sedation recently discontinued. Opens eyes spontaneously. Moves BUE BLE purposefully and intermittently following commands GU: Foley Skin: clean, dry, cool.   Resolved Hospital Problem list     Assessment & Plan:  Patient s/p VFib arrest. PCCM has been consulted for Vent Management.   Acute Respiratory Failure requiring intubation -in setting of Vfib arrest -King placed by EMS, exchanged in ED -L lung opacity, possible aspiration? P ABG in 1 hr Titrate PEEP/FiO2 for SpO2 >92% Send tracheal aspirate Low threshold to start empiric abx for possible aspiration PNA  Recommend minimizing/holding sedation.  Pulm Hygiene AM CXR   S/p VFib arrest -downtime estimated 25 minutes -Patient is  awake now that sedation has been stopped. Patient is purposefully moving spontaneously and is following commands  P Discussed cooling with cardiology fellow. Patient is following commands, PCCM  advises patient not be cooled.  Supportive post-arrest care per primary     Rest Per Primary   Best practice:  Diet: NPO Pain/Anxiety/Delirium protocol (if indicated): Fent, versed  VAP protocol (if indicated):  DVT prophylaxis: Heparin per pharm  GI prophylaxis: Protonix   Glucose control: SSI Mobility: BR  Code Status: Full  Family Communication: Per Primary  Disposition: ICU   Labs   CBC: Recent Labs  Lab 11/21/18 1933 11/21/18 1939 11/21/18 2010  WBC 19.8*  --   --   NEUTROABS 13.4*  --   --   HGB 16.1 16.7 15.0  HCT 49.9 49.0 44.0  MCV 89.7  --   --   PLT 307  --   --     Basic Metabolic Panel: Recent Labs  Lab 11/21/18 1933 11/21/18 1939 11/21/18 2010  NA 139 141 142  K 3.6 3.5 3.4*  CL 109 108  --   CO2 13*  --   --   GLUCOSE 273* 269*  --   BUN 23* 25*  --   CREATININE 1.48* 1.20  --   CALCIUM 8.1*  --   --    GFR: Estimated Creatinine Clearance: 82.3 mL/min (by C-G formula based on SCr of 1.2 mg/dL). Recent Labs  Lab 11/21/18 1933 11/21/18 1934  WBC 19.8*  --   LATICACIDVEN  --  8.2*    Liver Function Tests: Recent Labs  Lab 11/21/18 1933  AST 50*  ALT 49*  ALKPHOS 58  BILITOT 0.6  PROT 6.1*  ALBUMIN 3.4*   No results for input(s): LIPASE, AMYLASE in the last 168 hours. No results for input(s): AMMONIA in the last 168 hours.  ABG    Component Value Date/Time   PHART 7.333 (L) 11/21/2018 2010   PCO2ART 31.0 (L) 11/21/2018 2010   PO2ART 270.0 (H) 11/21/2018 2010   HCO3 16.8 (L) 11/21/2018 2010   TCO2 18 (L) 11/21/2018 2010   ACIDBASEDEF 9.0 (H) 11/21/2018 2010   O2SAT 100.0 11/21/2018 2010     Coagulation Profile: No results for input(s): INR, PROTIME in the last 168 hours.  Cardiac Enzymes: No results for input(s): CKTOTAL, CKMB, CKMBINDEX, TROPONINI in the last 168 hours.  HbA1C: No results found for: HGBA1C  CBG: No results for input(s): GLUCAP in the last 168 hours.  Review of Systems:   Unable to obtain, patient intubated  Past Medical History  He,  has no past medical history on file.   Surgical History     Social History      Family History   His family history is not on file.   Allergies No Known Allergies   Home Medications  Prior to Admission medications   Not on  File     Critical care time: 45 minutes      Eliseo Gum MSN, AGACNP-BC [place] [phone] If no answer, [phone] 11/21/2018, 10:08 PM

## 2018-11-21 NOTE — ED Notes (Signed)
Called to activated code stemi @ 7:40 PM

## 2018-11-21 NOTE — ED Notes (Signed)
Cath pads applied

## 2018-11-21 NOTE — ED Notes (Signed)
Pt enroute to cath lab

## 2018-11-21 NOTE — H&P (Signed)
Cardiology History & Physical    Patient ID: George Munoz MRN: [id number]; DOB: May 17, 1970   Admission date: 11/21/2018  Primary Care Provider: [facility] Primary Cardiologist: No primary care provider on file.  Primary Electrophysiologist:  None   Chief Complaint:  VF arrest, STEMI  Patient Profile:   George Munoz is a 48 y.o. male with unknown past medical history who presents with cardiac arrest.  History of Present Illness:   History obtained from EMS and family members.  Patient complained of chest pain and some nausea earlier today.  He then became unresponsive while at home with his family members present.  His girlfriend performed CPR and EMS was called.  He received 1 shock for what was called VF (no strips available for review) but was also noted to be in atrial fibrillation.  Brought to the emergency room where twelve-lead ECG demonstrated inferior ST elevations.  He was intubated for airway protection.  Patient was otherwise stable at this point on no vasopressors but not following commands.  Cath Lab was activated for possible STEMI.  Coronary angiography demonstrated no obvious culprit lesion but multivessel disease involving the circumflex and marginal and diagonal branches.  Patient continued to be in A. fib with fairly rapid ventricular rates of 140-150.  He received further IV amiodarone for rate control.  Following catheterization he was admitted to the ICU for further care.  Family was updated regarding his status.   Past medical history: Unknown  History reviewed. No pertinent surgical history.   Medications Prior to Admission: Prior to Admission medications   Not on File     Allergies:   No Known Allergies  Social History:   Unknown regarding tobacco or alcohol.  Has a girlfriend and adult children.  Family History:   No pertinent family history  ROS:  Unable to obtain due to intubation.  Physical Exam/Data:    Vitals:   11/21/18 2051 11/21/18 2056 11/21/18 2101 11/21/18 2108  BP: (!) 142/103 123/88    Pulse: 98 (!) 0 (!) 128 (!) 155  Resp: 17 (!) 0 (!) 0 (!) 21  Temp:      TempSrc:      SpO2: 100% (!) 0% (!) 0%   Weight:      Height:        Intake/Output Summary (Last 24 hours) at 11/21/2018 2221 Last data filed at 11/21/2018 2018 Gross per 24 hour  Intake 1000 ml  Output -  Net 1000 ml   Last 3 Weights 11/21/2018  Weight (lbs) 200 lb  Weight (kg) 90.719 kg     Body mass index is 30.41 kg/m.  Wt Readings from Last 3 Encounters:  11/21/18 90.7 kg    Physical Exam: General: intubated Head: Normocephalic, atraumatic, sclera non-icteric  Neck: Negative for carotid bruits. JVD not elevated. Lungs: symmetric breath sounds Heart: irregularly irregular Abdomen: Soft, non-tender, non-distended with normoactive bowel sounds. No hepatomegaly. No rebound/guarding. No obvious abdominal masses. Msk:  Strength and tone appear normal for age. Extremities: No clubbing or cyanosis. No edema.  Distal pedal pulses are 2+ and equal bilaterally. Neuro: Alert and oriented X 3. No focal deficit. No facial asymmetry. Moves all extremities spontaneously. Psych:  Responds to questions appropriately with a normal affect.    EKG:  The ECG that was done demonstrates AF with inferior ST elevations  Relevant CV Studies: none  Laboratory Data:  High Sensitivity Troponin:   Recent Labs  Lab 11/21/18 1933  TROPONINIHS 23*  Cardiac EnzymesNo results for input(s): TROPONINI in the last 168 hours. No results for input(s): TROPIPOC in the last 168 hours.  Chemistry Recent Labs  Lab 11/21/18 1933 11/21/18 1939 11/21/18 2010  NA 139 141 142  K 3.6 3.5 3.4*  CL 109 108  --   CO2 13*  --   --   GLUCOSE 273* 269*  --   BUN 23* 25*  --   CREATININE 1.48* 1.20  --   CALCIUM 8.1*  --   --   GFRNONAA 55*  --   --   GFRAA >60  --   --   ANIONGAP 17*  --   --     Recent Labs  Lab 11/21/18 1933   PROT 6.1*  ALBUMIN 3.4*  AST 50*  ALT 49*  ALKPHOS 58  BILITOT 0.6   Hematology Recent Labs  Lab 11/21/18 1933 11/21/18 1939 11/21/18 2010  WBC 19.8*  --   --   RBC 5.56  --   --   HGB 16.1 16.7 15.0  HCT 49.9 49.0 44.0  MCV 89.7  --   --   MCH 29.0  --   --   MCHC 32.3  --   --   RDW 14.6  --   --   PLT 307  --   --    BNPNo results for input(s): BNP, PROBNP in the last 168 hours.  DDimer No results for input(s): DDIMER in the last 168 hours.   Radiology/Studies:  Dg Chest Port 1 View  Result Date: 11/21/2018 CLINICAL DATA:  Intubation, AMS EXAM: PORTABLE CHEST 1 VIEW COMPARISON:  11/03/2018 FINDINGS: Endotracheal tube is positioned with tip below the thoracic inlet. Esophagogastric tube is positioned with tip and side port below the diaphragm. There appears to be heterogeneous opacity of the infrahilar left lung, although defibrillator leads may exaggerate this appearance. The lungs are otherwise normally aerated. The heart and mediastinum are normal. IMPRESSION: Endotracheal tube is positioned with tip below the thoracic inlet. Esophagogastric tube is positioned with tip and side port below the diaphragm. There appears to be heterogeneous opacity of the infrahilar left lung concerning for infection or aspiration, although defibrillator leads may exaggerate this appearance. The lungs are otherwise normally aerated. The heart and mediastinum are normal. Electronically Signed   By: Eddie Candle M.D.   On: 11/21/2018 19:48    Assessment and Plan   1. Cardiac arrest Patient is now status post VF arrest and is intubated for airway protection.  We discussed possible therapeutic hypothermia with critical care, however his neurologic exam is improving and he is beginning to follow commands.  Therefore we will not pursue temperature management.  Plan to continue supportive care with ventilator support and will continue to assess his neurologic status.  Sedation will be used as needed  should it be necessary.  Etiology of the cardiac arrest is unclear, but may have been precipitated by ischemia in the setting of rapid A. fib and multivessel coronary disease.  2. Multivessel CAD She was found to multivessel coronary disease on cardiac catheterization, without an obvious culprit vessel to explain the inferior ST segment elevations.  Current plan is to allow patient to recover from his cardiac arrest and reassess his neurologic status.  If he wakes up completely, would consider CABG for revascularization.  PCI could also be considered although there are some anatomic difficulties regarding the circumflex.  Echocardiogram is ordered.  3. Atrial fibrillation with RVR Patient presented in rapid atrial  fibrillation and does not appear to have a known history of this.  Suspect that he acutely went into A. fib today, as it seems unlikely he would tolerate these rapid rates without symptoms.  We will plan to continue IV amiodarone for both rate and rhythm control, and will add low-dose IV metoprolol to assist with rate control.  He is on IV heparin for anticoagulation.  Cardioversion could be considered; would need to discuss possible TEE given unclear duration of A. fib.  4. Hyperglycemia Blood glucose is in the high 200s on chemistry panels.  Will add A1c, glucose checks and sliding scale insulin.    Severity of Illness: The appropriate patient status for this patient is INPATIENT. Inpatient status is judged to be reasonable and necessary in order to provide the required intensity of service to ensure the patient's safety. The patient's presenting symptoms, physical exam findings, and initial radiographic and laboratory data in the context of their chronic comorbidities is felt to place them at high risk for further clinical deterioration. Furthermore, it is not anticipated that the patient will be medically stable for discharge from the hospital within 2 midnights of admission. The  following factors support the patient status of inpatient.   " The patient's presenting symptoms include cardiac arrest. " The worrisome physical exam findings include unresposive. " The initial radiographic and laboratory data are worrisome because of STEMi. " The chronic co-morbidities include diabetes.   * I certify that at the point of admission it is my clinical judgment that the patient will require inpatient hospital care spanning beyond 2 midnights from the point of admission due to high intensity of service, high risk for further deterioration and high frequency of surveillance required.*    For questions or updates, please contact [facility] Please consult [email or web address] for contact info under      Signed, Amerah Puleo S, MD 11/21/2018, 10:21 PM

## 2018-11-21 NOTE — Progress Notes (Signed)
Responded to Code STEMI. Pt in Trauma C going to cath Lab. I offered spiritual care with ministry of presence, and silent prayer. No family contacts at this time.  Chaplain Fidel Levy [phone]

## 2018-11-21 NOTE — ED Notes (Signed)
300 Rectal ASA given prior to leaving for the cath lab.

## 2018-11-21 NOTE — Progress Notes (Signed)
ANTICOAGULATION CONSULT NOTE - [place] for heparin Indication: chest pain/ACS  No Known Allergies  Patient Measurements: Height: 5\' 8"  (172.7 cm) Weight: 200 lb (90.7 kg) IBW/kg (Calculated) : 68.4 Heparin Dosing Weight: 87 kg  Vital Signs: Temp: 94.7 F (34.8 C) (07/26 2015) Temp Source: Tympanic (07/26 1927) BP: 123/88 (07/26 2056) Pulse Rate: 155 (07/26 2108)  Labs: Recent Labs    11/21/18 1933 11/21/18 1939 11/21/18 2010  HGB 16.1 16.7 15.0  HCT 49.9 49.0 44.0  PLT 307  --   --   CREATININE 1.48* 1.20  --   TROPONINIHS 23*  --   --     Estimated Creatinine Clearance: 82.3 mL/min (by C-G formula based on SCr of 1.2 mg/dL).   Medical History: No past medical history on file.   Assessment: 59 yoM admitted with cardiac arrest as code STEMI s/p cath lab. Pt has multivessel CAD, will need evaluation for CABG. Pt following commands so code cool cancelled. Pharmacy to resume heparin in 6hr from sheath removal (~2100).  Goal of Therapy:  Heparin level 0.3-0.7 units/ml Monitor platelets by anticoagulation protocol: Yes   Plan:  -Restart heparin no bolus at 1100 units/hr at 0300 7/27 -Check 6hr heparin level   Arrie Senate, PharmD, BCPS Clinical Pharmacist Please check AMION for all [facility] numbers 11/21/2018

## 2018-11-21 NOTE — ED Notes (Signed)
Groins pulses clipped and marked.

## 2018-11-21 NOTE — Code Documentation (Signed)
Intubating pt  

## 2018-11-22 ENCOUNTER — Other Ambulatory Visit: Payer: Self-pay

## 2018-11-22 ENCOUNTER — Inpatient Hospital Stay (HOSPITAL_COMMUNITY): Payer: Medicaid Other

## 2018-11-22 ENCOUNTER — Encounter (HOSPITAL_COMMUNITY): Payer: Self-pay | Admitting: Cardiovascular Disease

## 2018-11-22 DIAGNOSIS — I2510 Atherosclerotic heart disease of native coronary artery without angina pectoris: Secondary | ICD-10-CM

## 2018-11-22 DIAGNOSIS — I469 Cardiac arrest, cause unspecified: Secondary | ICD-10-CM

## 2018-11-22 DIAGNOSIS — J9601 Acute respiratory failure with hypoxia: Secondary | ICD-10-CM

## 2018-11-22 LAB — COMPREHENSIVE METABOLIC PANEL
ALT: 49 U/L — ABNORMAL HIGH (ref 0–44)
AST: 48 U/L — ABNORMAL HIGH (ref 15–41)
Albumin: 3.3 g/dL — ABNORMAL LOW (ref 3.5–5.0)
Alkaline Phosphatase: 50 U/L (ref 38–126)
Anion gap: 10 (ref 5–15)
BUN: 22 mg/dL — ABNORMAL HIGH (ref 6–20)
CO2: 19 mmol/L — ABNORMAL LOW (ref 22–32)
Calcium: 8.5 mg/dL — ABNORMAL LOW (ref 8.9–10.3)
Chloride: 112 mmol/L — ABNORMAL HIGH (ref 98–111)
Creatinine, Ser: 1.4 mg/dL — ABNORMAL HIGH (ref 0.61–1.24)
GFR calc Af Amer: >60 mL/min (ref >60)
GFR calc non Af Amer: 59 mL/min — ABNORMAL LOW (ref >60)
Glucose, Bld: 128 mg/dL — ABNORMAL HIGH (ref 70–99)
Potassium: 5 mmol/L (ref 3.5–5.1)
Sodium: 141 mmol/L (ref 135–145)
Total Bilirubin: 0.7 mg/dL (ref 0.3–1.2)
Total Protein: 5.8 g/dL — ABNORMAL LOW (ref 6.5–8.1)

## 2018-11-22 LAB — GLUCOSE, CAPILLARY
Glucose-Capillary: 111 mg/dL — ABNORMAL HIGH (ref 70–99)
Glucose-Capillary: 114 mg/dL — ABNORMAL HIGH (ref 70–99)
Glucose-Capillary: 216 mg/dL — ABNORMAL HIGH (ref 70–99)
Glucose-Capillary: 71 mg/dL (ref 70–99)
Glucose-Capillary: 83 mg/dL (ref 70–99)
Glucose-Capillary: 91 mg/dL (ref 70–99)

## 2018-11-22 LAB — CBC WITH DIFFERENTIAL/PLATELET
Abs Immature Granulocytes: 0.1 10*3/uL — ABNORMAL HIGH (ref 0.00–0.07)
Basophils Absolute: 0 10*3/uL (ref 0.0–0.1)
Basophils Relative: 0 %
Eosinophils Absolute: 0 10*3/uL (ref 0.0–0.5)
Eosinophils Relative: 0 %
HCT: 45.4 % (ref 39.0–52.0)
Hemoglobin: 15 g/dL (ref 13.0–17.0)
Immature Granulocytes: 1 %
Lymphocytes Relative: 7 %
Lymphs Abs: 1.2 10*3/uL (ref 0.7–4.0)
MCH: 29 pg (ref 26.0–34.0)
MCHC: 33 g/dL (ref 30.0–36.0)
MCV: 87.6 fL (ref 80.0–100.0)
Monocytes Absolute: 0.9 10*3/uL (ref 0.1–1.0)
Monocytes Relative: 5 %
Neutro Abs: 15.1 10*3/uL — ABNORMAL HIGH (ref 1.7–7.7)
Neutrophils Relative %: 87 %
Platelets: 248 10*3/uL (ref 150–400)
RBC: 5.18 MIL/uL (ref 4.22–5.81)
RDW: 14.6 % (ref 11.5–15.5)
WBC: 17.3 10*3/uL — ABNORMAL HIGH (ref 4.0–10.5)
nRBC: 0 % (ref 0.0–0.2)

## 2018-11-22 LAB — APTT: aPTT: >200 seconds (ref 24–36)

## 2018-11-22 LAB — HEMOGLOBIN A1C
Hgb A1c MFr Bld: 5.9 % — ABNORMAL HIGH (ref 4.8–5.6)
Mean Plasma Glucose: 122.63 mg/dL

## 2018-11-22 LAB — TROPONIN I (HIGH SENSITIVITY)
Troponin I (High Sensitivity): 4823 ng/L (ref <18)
Troponin I (High Sensitivity): 11291 ng/L (ref <18)

## 2018-11-22 LAB — MRSA PCR SCREENING: MRSA by PCR: NEGATIVE

## 2018-11-22 LAB — URINE CULTURE: Culture: NO GROWTH

## 2018-11-22 LAB — HEPARIN LEVEL (UNFRACTIONATED)
Heparin Unfractionated: 0.32 IU/mL (ref 0.30–0.70)
Heparin Unfractionated: 0.36 IU/mL (ref 0.30–0.70)

## 2018-11-22 LAB — PROTIME-INR
INR: 1.3 — ABNORMAL HIGH (ref 0.8–1.2)
Prothrombin Time: 15.5 seconds — ABNORMAL HIGH (ref 11.4–15.2)

## 2018-11-22 LAB — HIV ANTIBODY (ROUTINE TESTING W REFLEX): HIV Screen 4th Generation wRfx: NONREACTIVE

## 2018-11-22 LAB — ECHOCARDIOGRAM COMPLETE
Height: 68 in
Weight: 3100.55 oz

## 2018-11-22 LAB — TRIGLYCERIDES: Triglycerides: 73 mg/dL (ref <150)

## 2018-11-22 LAB — TSH: TSH: 1.746 u[IU]/mL (ref 0.350–4.500)

## 2018-11-22 LAB — MAGNESIUM: Magnesium: 1.9 mg/dL (ref 1.7–2.4)

## 2018-11-22 MED ORDER — CHLORHEXIDINE GLUCONATE CLOTH 2 % EX PADS
6.0000 | MEDICATED_PAD | Freq: Every day | CUTANEOUS | Status: DC
  Administered 2018-11-22: 6 via TOPICAL

## 2018-11-22 MED ORDER — LACTATED RINGERS IV BOLUS
500.0000 mL | Freq: Once | INTRAVENOUS | Status: AC
  Administered 2018-11-22: 500 mL via INTRAVENOUS

## 2018-11-22 MED ORDER — DEXMEDETOMIDINE HCL IN NACL 200 MCG/50ML IV SOLN: 0.4000 ug/kg/h | INTRAVENOUS | Status: DC

## 2018-11-22 MED FILL — Medication: Qty: 1 | Status: AC

## 2018-11-22 MED FILL — Cangrelor Tetrasodium For IV Soln 50 MG: INTRAVENOUS | Qty: 50 | Status: AC

## 2018-11-22 MED FILL — Heparin Sod (Porcine)-NaCl IV Soln 1000 Unit/500ML-0.9%: INTRAVENOUS | Qty: 1500 | Status: AC

## 2018-11-22 NOTE — ED Notes (Signed)
Wallet given to Security to lock up.  Sent valuables envelope to [facility].

## 2018-11-22 NOTE — Progress Notes (Signed)
  Echocardiogram 2D Echocardiogram has been performed.  George Munoz 11/22/2018, 10:41 AM

## 2018-11-22 NOTE — Progress Notes (Signed)
Family contact information Eilene Ghazi (girlfriend) [phone] Jerald Hennington (son) [phone]

## 2018-11-22 NOTE — Progress Notes (Signed)
CRITICAL VALUE ALERT  Critical Value:  LACTIC ACID 5.7 ; PTT >200  Date & Time Notied:  11/21/2018 2340   Provider Notified: Dr Genevive Bi  Orders Received/Actions taken: none

## 2018-11-22 NOTE — Progress Notes (Signed)
ANTICOAGULATION CONSULT NOTE - Follow-Up Consult  Pharmacy Consult for heparin Indication: chest pain/ACS  No Known Allergies  Patient Measurements: Height: 5\' 8"  (172.7 cm) Weight: 193 lb 12.6 oz (87.9 kg) IBW/kg (Calculated) : 68.4 Heparin Dosing Weight: 87 kg  Vital Signs: Temp: 100.2 F (37.9 C) (07/27 0900) BP: 131/84 (07/27 0900) Pulse Rate: 95 (07/27 0900)  Labs: Recent Labs    11/21/18 1933 11/21/18 1939 11/21/18 2010 11/21/18 2302 11/22/18 0235 11/22/18 0914  HGB 16.1 16.7 15.0  --  15.0  --   HCT 49.9 49.0 44.0  --  45.4  --   PLT 307  --   --   --  248  --   APTT  --   --   --  >200*  --   --   LABPROT  --   --   --  15.5*  --   --   INR  --   --   --  1.3*  --   --   HEPARINUNFRC  --   --   --   --   --  0.32  CREATININE 1.48* 1.20  --   --  1.40*  --   TROPONINIHS 23*  --   --   --   --   --     Estimated Creatinine Clearance: 69.5 mL/min (A) (by C-G formula based on SCr of 1.4 mg/dL (H)).    Assessment: 41 yoM admitted with cardiac arrest as code STEMI s/p cath lab with VDRF. Pt has multivessel CAD, will need evaluation for CABG. Pharmacy consulted to dose heparin -heparin level at goal, CBC stable  Goal of Therapy:  Heparin level 0.3-0.7 units/ml Monitor platelets by anticoagulation protocol: Yes   Plan:  -No heparin changes needed -Will confirm heparin level later today -Daily heparin level and CBC  Hildred Laser, PharmD Clinical Pharmacist **Pharmacist phone directory can now be found on [email or web address] ([email or web address]).  Listed under [place].

## 2018-11-22 NOTE — Progress Notes (Signed)
eLink Physician-Brief Progress Note Patient Name: George Munoz DOB: June 14, 1970 MRN: [id number]   Date of Service  11/22/2018  HPI/Events of Note  Notified of hypotension 60/45 HR 68. Lopressor given earlier  eICU Interventions   Titrate down sedation  Bolus 500 cc LR     Intervention Category Major Interventions: Hypotension - evaluation and management  Judd Lien 11/22/2018, 3:34 AM

## 2018-11-22 NOTE — Progress Notes (Signed)
Assisted tele visit to patient with family member.  Keny Donald P, RN  

## 2018-11-22 NOTE — Progress Notes (Signed)
ANTICOAGULATION CONSULT NOTE - Follow-Up Consult  Pharmacy Consult for heparin Indication: chest pain/ACS  No Known Allergies  Patient Measurements: Height: 5\' 8"  (172.7 cm) Weight: 193 lb 12.6 oz (87.9 kg) IBW/kg (Calculated) : 68.4 Heparin Dosing Weight: 87 kg  Vital Signs: Temp: 100.4 F (38 C) (07/27 1600) BP: 129/75 (07/27 1600) Pulse Rate: 81 (07/27 1600)  Labs: Recent Labs    11/21/18 1933 11/21/18 1939 11/21/18 2010 11/21/18 2302 11/22/18 0235 11/22/18 0914 11/22/18 1449  HGB 16.1 16.7 15.0  --  15.0  --   --   HCT 49.9 49.0 44.0  --  45.4  --   --   PLT 307  --   --   --  248  --   --   APTT  --   --   --  >200*  --   --   --   LABPROT  --   --   --  15.5*  --   --   --   INR  --   --   --  1.3*  --   --   --   HEPARINUNFRC  --   --   --   --   --  0.32 0.36  CREATININE 1.48* 1.20  --   --  1.40*  --   --   TROPONINIHS 23*  --   --   --   --  4,823* 11,291*    Estimated Creatinine Clearance: 69.5 mL/min (A) (by C-G formula based on SCr of 1.4 mg/dL (H)).    Assessment: 59 yoM admitted with cardiac arrest as code STEMI s/p cath lab with VDRF. Pt has multivessel CAD, will need evaluation for CABG. Pharmacy consulted to dose heparin.  Confirmatory heparin level at goal, no changes tonight.   Goal of Therapy:  Heparin level 0.3-0.7 units/ml Monitor platelets by anticoagulation protocol: Yes   Plan:  -No heparin changes needed -Daily heparin level and CBC  Erin Hearing PharmD., BCPS Clinical Pharmacist 11/22/2018 4:17 PM

## 2018-11-22 NOTE — Procedures (Signed)
Extubation Procedure Note  Patient Details:   Name: George Munoz DOB: 09/29/1970 MRN: [id number]   Airway Documentation:    Vent end date: 11/22/18 Vent end time: 1055   Evaluation  O2 sats: stable throughout Complications: No apparent complications Patient did tolerate procedure well. Bilateral Breath Sounds: Clear, Diminished   Yes   Pt extubated to [facility]. Positive cuff leak was noted. Pt was suctioned prior to extubation. Tolerated well. No stridor noted. Pt able to state name and incentive spirometry 1065mL. VSS. RN at bedside.  Rossie Muskrat R 11/22/2018, 1055

## 2018-11-22 NOTE — Progress Notes (Signed)
Progress Note  Patient Name: George Munoz Date of Encounter: 11/22/2018  Primary Cardiologist: No primary care provider on file. New  Subjective   Pt awake this am on vent. Agitation overnight but calm this am.   Inpatient Medications    Scheduled Meds: . aspirin  81 mg Oral Daily  . atorvastatin  80 mg Oral q1800  . chlorhexidine gluconate (MEDLINE KIT)  15 mL Mouth Rinse BID  . insulin aspart  0-15 Units Subcutaneous Q4H  . mouth rinse  15 mL Mouth Rinse 10 times per day  . metoprolol tartrate  2.5 mg Intravenous Q6H  . pantoprazole (PROTONIX) IV  40 mg Intravenous Daily  . sodium chloride flush  3 mL Intravenous Q12H   Continuous Infusions: . sodium chloride    . sodium chloride 75 mL/hr at 11/22/18 0600  . amiodarone    . dexmedetomidine (PRECEDEX) IV infusion    . fentaNYL infusion INTRAVENOUS 200 mcg/hr (11/22/18 0600)  . heparin 1,100 Units/hr (11/22/18 0600)  . propofol (DIPRIVAN) infusion Stopped (11/22/18 0419)  . propofol     PRN Meds: sodium chloride, acetaminophen, bisacodyl, fentaNYL, midazolam, midazolam, nitroGLYCERIN, ondansetron (ZOFRAN) IV, sennosides, sodium chloride flush   Vital Signs    Vitals:   11/22/18 0545 11/22/18 0600 11/22/18 0615 11/22/18 0630  BP: 104/72 118/75 126/78 117/86  Pulse: 75 (!) 102 95 84  Resp: 18 19 18 18   Temp: 99 F (37.2 C) 99.1 F (37.3 C) 99.1 F (37.3 C) 99.1 F (37.3 C)  TempSrc:      SpO2: 100% 99% 99% 98%  Weight:      Height:        Intake/Output Summary (Last 24 hours) at 11/22/2018 0757 Last data filed at 11/22/2018 0600 Gross per 24 hour  Intake 2711.05 ml  Output 1125 ml  Net 1586.05 ml   Last 3 Weights 11/22/2018 11/21/2018  Weight (lbs) 193 lb 12.6 oz 200 lb  Weight (kg) 87.9 kg 90.719 kg      Telemetry    Sinus tach - Personally Reviewed  ECG    NSR, no ischemic changes - Personally Reviewed  Physical Exam   General: Well developed, well nourished, awake on vent HEENT: ET  tube in place SKIN: warm, dry. No rashes. Neuro: moving all extremities Psychiatric: unable to assess, pt awake on vent Neck: No JVD Lungs:Clear bilaterally, no wheezes, rhonci, crackles Cardiovascular: Regular rate and rhythm. No murmurs, gallops or rubs. Abdomen:Soft. Bowel sounds present.  Extremities: No lower extremity edema. Pulses are 2 + in the bilateral DP/PT.   Labs    High Sensitivity Troponin:   Recent Labs  Lab 11/21/18 1933  TROPONINIHS 23*      Cardiac EnzymesNo results for input(s): TROPONINI in the last 168 hours. No results for input(s): TROPIPOC in the last 168 hours.   Chemistry Recent Labs  Lab 11/21/18 1933 11/21/18 1939 11/21/18 2010 11/22/18 0235  NA 139 141 142 141  K 3.6 3.5 3.4* 5.0  CL 109 108  --  112*  CO2 13*  --   --  19*  GLUCOSE 273* 269*  --  128*  BUN 23* 25*  --  22*  CREATININE 1.48* 1.20  --  1.40*  CALCIUM 8.1*  --   --  8.5*  PROT 6.1*  --   --  5.8*  ALBUMIN 3.4*  --   --  3.3*  AST 50*  --   --  48*  ALT 49*  --   --  49*  ALKPHOS 58  --   --  50  BILITOT 0.6  --   --  0.7  GFRNONAA 55*  --   --  59*  GFRAA >60  --   --  >60  ANIONGAP 17*  --   --  10     Hematology Recent Labs  Lab 11/21/18 1933 11/21/18 1939 11/21/18 2010 11/22/18 0235  WBC 19.8*  --   --  17.3*  RBC 5.56  --   --  5.18  HGB 16.1 16.7 15.0 15.0  HCT 49.9 49.0 44.0 45.4  MCV 89.7  --   --  87.6  MCH 29.0  --   --  29.0  MCHC 32.3  --   --  33.0  RDW 14.6  --   --  14.6  PLT 307  --   --  248    BNPNo results for input(s): BNP, PROBNP in the last 168 hours.   DDimer No results for input(s): DDIMER in the last 168 hours.   Radiology    Portable Chest Xray  Result Date: 11/22/2018 CLINICAL DATA:  Intubation. EXAM: PORTABLE CHEST 1 VIEW COMPARISON:  11/21/2018. FINDINGS: Endotracheal tube and NG tube in stable position. Heart size normal. Bibasilar atelectasis/infiltrates. No pleural effusion or pneumothorax. IMPRESSION: 1.  Lines and  tubes in stable position. 2.  Bibasilar atelectasis/infiltrates. Electronically Signed   By: Marcello Moores  Register   On: 11/22/2018 06:20   Dg Chest Port 1 View  Result Date: 11/21/2018 CLINICAL DATA:  Intubation, AMS EXAM: PORTABLE CHEST 1 VIEW COMPARISON:  11/03/2018 FINDINGS: Endotracheal tube is positioned with tip below the thoracic inlet. Esophagogastric tube is positioned with tip and side port below the diaphragm. There appears to be heterogeneous opacity of the infrahilar left lung, although defibrillator leads may exaggerate this appearance. The lungs are otherwise normally aerated. The heart and mediastinum are normal. IMPRESSION: Endotracheal tube is positioned with tip below the thoracic inlet. Esophagogastric tube is positioned with tip and side port below the diaphragm. There appears to be heterogeneous opacity of the infrahilar left lung concerning for infection or aspiration, although defibrillator leads may exaggerate this appearance. The lungs are otherwise normally aerated. The heart and mediastinum are normal. Electronically Signed   By: Eddie Candle M.D.   On: 11/21/2018 19:48    Cardiac Studies   Cardiac cath 11/21/18:  Mid RCA lesion is 40% stenosed.  Prox Cx lesion is 99% stenosed.  1st Mrg lesion is 99% stenosed.  2nd Diag lesion is 90% stenosed.  Ost LM to Dist LM lesion is 50% stenosed.  Prox RCA lesion is 50% stenosed.  There is mild left ventricular systolic dysfunction.  LV end diastolic pressure is normal.  The left ventricular ejection fraction is 45-50% by visual estimate.  There is no mitral valve regurgitation.  Mid LAD lesion is 95% stenosed.   1. Moderate distal left main stenosis 2. Severe mid LAD stenosis 3. Severe mid Circumflex stenosis involving the ostium of a very large obtuse marginal branch 4. Moderate mid RCA stenosis 5. Mild segmental LV systolic dysfunction (NLZJ=67-34%).  6. Post cardiac arrest (ventricular fibrillation) 7. Rapid  atrial fibrillation  Patient Profile     48 y.o. male with h/o depression/anxiety admitted following cardiac arrest at home. CPR by girlfriend for 25 minutes prior to EMS arrival. Rapid atrial fib on EMS arrival then ventricular fibrillation requiring defibrillation. Pt with no purposeful movements on no sedation during cath. Multi-vessel CAD on cath with overall  low normal LV systolic function. No thrombotic lesions on cath. No coronary intervention performed.   Assessment & Plan    1. CAD/NSTEMI/Cardiac arrest: Pt admitted following cardiac arrest on 11/21/18. He was in atrial fib with RVR and had diffuse ischemic changes on his EKG. Severe disease in the Circumflex/large OM, mid LAD and moderate distal left main disease. No coronary intervention performed. If he has neurological recovery, will need CT surgery consultation for CABG. It is encouraging this am that he is awake. Will work with PCCM to wean the vent. Will continue ASA, statin and beta blocker. Continue IV heparin. I will call CT surgery after he is extubated.   2. Atrial fibrillation with RVR: Sinus this am. Will continue IV metoprolol  3. Respiratory failure: Continue vent. Wean today per PCCM   For questions or updates, please contact [place] Please consult [email or web address] for contact info under        Signed, Lauree Chandler, MD  11/22/2018, 7:57 AM

## 2018-11-22 NOTE — Progress Notes (Addendum)
Assisted tele visit to patient with girlfriend, Larene Beach.  Maryelizabeth Rowan, RN

## 2018-11-22 NOTE — Progress Notes (Signed)
decreasing sedation due to low b/p and LR bolus given. Pt woke up after sedation off / RASS 4. Pt is combative, restless, beligerant and trying to extubate. 4 RNs had to hold pt down to prevent from self extubating and pulling lines. Sedation re-started.. will continue to monitor

## 2018-11-22 NOTE — Progress Notes (Signed)
NAME:  George Munoz, MRN:  [id number], DOB:  1970/06/08, LOS: 1 ADMISSION DATE:  11/21/2018, CONSULTATION DATE:  11/21/2018 REFERRING MD:  EDP, CHIEF COMPLAINT:  S/p cardiac arrest    Brief History   48 yo M s/p VFib arrest with ROSC. Went to cath lab, multivessel disease however no acute thrombus   History of present illness   History obtained from chart review  48 yo M PMH anxiety, depression who presents 7/26 via [place]. Ems was dispatched for chest pain. Upon EMS arrival, patient found to be in rapid Atrial fibrillation. En route, patient acutely decompensated. He became unresponsive and rhythm was noted to be Vfib He received 1x defib at 360, received 1 epi, 1 bolus of lido, as well as CPR.  King airway placed by EMS. ROSC achieved after estimated 25 minutes.   In ED, patient reportedly with some spontaneous movement. Edison Pace exchanged for ETT in ED. Code STEMI activated. Patient to cath lab, noted to have multivessel disease but no acute thrombus.   PCCM consulted for vent management   Past Medical History  Depression Anxiety   Significant Hospital Events   7/26> Vfib arrest, 1x DF, 1x epi, 1x lido, CPR. ROSC after 25 minutes. Intubated. Cath lab reveals no acute thrombus but shows multivessel disease.   Consults:  PCCM   Procedures:  7/26 ETT>>> 7/26 Cardiac cath>>>  Significant Diagnostic Tests:  7/26 CXR> ETT tip below thoracic inlet. L infrahilar opacity.   Micro Data:  7/26 SARS CoV2> negative  7/26 BC x 2 > NGTD  Antimicrobials:  None   Interim history/subjective:  Awake this AM.  Following commands.  Objective   Blood pressure 135/83, pulse (!) 117, temperature 99.1 F (37.3 C), resp. rate (!) 32, height 5\' 8"  (1.727 m), weight 87.9 kg, SpO2 100 %.    Vent Mode: CPAP;PSV FiO2 (%):  [40 %-100 %] 40 % Set Rate:  [18 bmp] 18 bmp Vt Set:  [550 mL] 550 mL PEEP:  [5 cmH20] 5 cmH20 Pressure Support:  [5 cmH20] 5 cmH20 Plateau Pressure:  [14  cmH20-20 cmH20] 14 cmH20   Intake/Output Summary (Last 24 hours) at 11/22/2018 0837 Last data filed at 11/22/2018 0600 Gross per 24 hour  Intake 2711.05 ml  Output 1125 ml  Net 1586.05 ml   Filed Weights   11/21/18 1934 11/22/18 0100  Weight: 90.7 kg 87.9 kg   General: Well nourished male, awake HENT: ETT in place Pulm: Good air movement with no wheezing or crackles  CV: RRR, no murmurs, no rubs  Abdomen: Active bowel sounds, soft, non-distended, no tenderness to palpation  Extremities: Pulses palpable in all extremities, no LE edema  Skin: Warm and dry  Neuro: Alert, following commands  Resolved Hospital Problem list   None  Assessment & Plan:  Patient s/p VFib arrest. PCCM has been consulted for Vent Management.   Acute Respiratory Failure requiring intubation - CXR this AM looks stable  - Tolerating PS 40%, 5/5  - Follows commands.  - Plan to wean sedation.  - If able to tolerate PS for another 15-20 minutes will plan to extibate today  S/p VFib arrest Multivessel CAD  - Per cardiology   Best practice:  Diet: NPO Pain/Anxiety/Delirium protocol (if indicated): Fent, versed  VAP protocol (if indicated):  DVT prophylaxis: Heparin per pharm  GI prophylaxis: Protonix  Glucose control: SSI Mobility: BR  Code Status: Full  Family Communication: Per Primary  Disposition: ICU   Labs   CBC: Recent  Labs  Lab 11/21/18 1933 11/21/18 1939 11/21/18 2010 11/22/18 0235  WBC 19.8*  --   --  17.3*  NEUTROABS 13.4*  --   --  15.1*  HGB 16.1 16.7 15.0 15.0  HCT 49.9 49.0 44.0 45.4  MCV 89.7  --   --  87.6  PLT 307  --   --  248   Basic Metabolic Panel: Recent Labs  Lab 11/21/18 1933 11/21/18 1939 11/21/18 2010 11/22/18 0235  NA 139 141 142 141  K 3.6 3.5 3.4* 5.0  CL 109 108  --  112*  CO2 13*  --   --  19*  GLUCOSE 273* 269*  --  128*  BUN 23* 25*  --  22*  CREATININE 1.48* 1.20  --  1.40*  CALCIUM 8.1*  --   --  8.5*  MG  --   --   --  1.9   GFR:  Estimated Creatinine Clearance: 69.5 mL/min (A) (by C-G formula based on SCr of 1.4 mg/dL (H)). Recent Labs  Lab 11/21/18 1933 11/21/18 1934 11/21/18 2302 11/22/18 0235  WBC 19.8*  --   --  17.3*  LATICACIDVEN  --  8.2* 5.7*  --    Liver Function Tests: Recent Labs  Lab 11/21/18 1933 11/22/18 0235  AST 50* 48*  ALT 49* 49*  ALKPHOS 58 50  BILITOT 0.6 0.7  PROT 6.1* 5.8*  ALBUMIN 3.4* 3.3*   No results for input(s): LIPASE, AMYLASE in the last 168 hours. No results for input(s): AMMONIA in the last 168 hours.  ABG    Component Value Date/Time   PHART 7.333 (L) 11/21/2018 2010   PCO2ART 31.0 (L) 11/21/2018 2010   PO2ART 270.0 (H) 11/21/2018 2010   HCO3 16.8 (L) 11/21/2018 2010   TCO2 18 (L) 11/21/2018 2010   ACIDBASEDEF 9.0 (H) 11/21/2018 2010   O2SAT 100.0 11/21/2018 2010    Coagulation Profile: Recent Labs  Lab 11/21/18 2302  INR 1.3*   Cardiac Enzymes: No results for input(s): CKTOTAL, CKMB, CKMBINDEX, TROPONINI in the last 168 hours.  HbA1C: Hgb A1c MFr Bld  Date/Time Value Ref Range Status  11/22/2018 02:35 AM 5.9 (H) 4.8 - 5.6 % Final    Comment:    (NOTE) Pre diabetes:          5.7%-6.4% Diabetes:              >6.4% Glycemic control for   <7.0% adults with diabetes    CBG: Recent Labs  Lab 11/21/18 2130 11/22/18 0003 11/22/18 0354  GLUCAP 300* 216* 71   Review of Systems:   Unable to obtain due to critical illness  Past Medical History  He,  has no past medical history on file.   Surgical History    Past Surgical History:  Procedure Laterality Date  . LEFT HEART CATH AND CORONARY ANGIOGRAPHY N/A 11/21/2018   Procedure: LEFT HEART CATH AND CORONARY ANGIOGRAPHY;  Surgeon: Kathleene Hazel, MD;  Location: [facility];  Service: Cardiovascular;  Laterality: N/A;    Social History      Family History   His family history is not on file.   Allergies No Known Allergies   Home Medications  Prior to Admission  medications   Not on File    Levora Dredge, MD IMTS PGY3  Pager: [phone]

## 2018-11-23 ENCOUNTER — Other Ambulatory Visit: Payer: Self-pay | Admitting: *Deleted

## 2018-11-23 ENCOUNTER — Encounter (HOSPITAL_COMMUNITY): Payer: Self-pay

## 2018-11-23 DIAGNOSIS — I25110 Atherosclerotic heart disease of native coronary artery with unstable angina pectoris: Secondary | ICD-10-CM

## 2018-11-23 DIAGNOSIS — F419 Anxiety disorder, unspecified: Secondary | ICD-10-CM

## 2018-11-23 DIAGNOSIS — I4891 Unspecified atrial fibrillation: Secondary | ICD-10-CM

## 2018-11-23 DIAGNOSIS — I2510 Atherosclerotic heart disease of native coronary artery without angina pectoris: Secondary | ICD-10-CM

## 2018-11-23 HISTORY — DX: Anxiety disorder, unspecified: F41.9

## 2018-11-23 LAB — CBC
HCT: 44.2 % (ref 39.0–52.0)
Hemoglobin: 14.2 g/dL (ref 13.0–17.0)
MCH: 29.2 pg (ref 26.0–34.0)
MCHC: 32.1 g/dL (ref 30.0–36.0)
MCV: 90.8 fL (ref 80.0–100.0)
Platelets: 210 10*3/uL (ref 150–400)
RBC: 4.87 MIL/uL (ref 4.22–5.81)
RDW: 15.4 % (ref 11.5–15.5)
WBC: 11.6 10*3/uL — ABNORMAL HIGH (ref 4.0–10.5)
nRBC: 0 % (ref 0.0–0.2)

## 2018-11-23 LAB — GLUCOSE, CAPILLARY
Glucose-Capillary: 89 mg/dL (ref 70–99)
Glucose-Capillary: 73 mg/dL (ref 70–99)

## 2018-11-23 LAB — BASIC METABOLIC PANEL
Anion gap: 8 (ref 5–15)
BUN: 14 mg/dL (ref 6–20)
CO2: 25 mmol/L (ref 22–32)
Calcium: 8.6 mg/dL — ABNORMAL LOW (ref 8.9–10.3)
Chloride: 107 mmol/L (ref 98–111)
Creatinine, Ser: 1.18 mg/dL (ref 0.61–1.24)
GFR calc Af Amer: >60 mL/min (ref >60)
GFR calc non Af Amer: >60 mL/min (ref >60)
Glucose, Bld: 96 mg/dL (ref 70–99)
Potassium: 4.2 mmol/L (ref 3.5–5.1)
Sodium: 140 mmol/L (ref 135–145)

## 2018-11-23 LAB — HEPARIN LEVEL (UNFRACTIONATED): Heparin Unfractionated: 0.32 IU/mL (ref 0.30–0.70)

## 2018-11-23 MED ORDER — PANTOPRAZOLE SODIUM 40 MG PO TBEC
40.0000 mg | DELAYED_RELEASE_TABLET | Freq: Every day | ORAL | Status: DC
  Administered 2018-11-23 – 2018-11-24 (×2): 40 mg via ORAL
  Filled 2018-11-23 (×2): qty 1

## 2018-11-23 MED ORDER — METOPROLOL TARTRATE 25 MG PO TABS
25.0000 mg | ORAL_TABLET | Freq: Two times a day (BID) | ORAL | Status: DC
  Administered 2018-11-23 – 2018-11-24 (×4): 25 mg via ORAL
  Filled 2018-11-23 (×4): qty 1

## 2018-11-23 NOTE — TOC Initial Note (Signed)
Transition of Care [facility]) - Initial/Assessment Note    Patient Details  Name: George Munoz MRN: [id number] Date of Birth: [birth year]  Transition of [facility]) CM/SW Contact:    Bethena Roys, RN Phone Number: 11/23/2018, 3:40 PM  Clinical Narrative: Pt presented for VF arrest-had chest pain and SOB for 3 weeks. Plan for CABG 11-25-18. PTA patient states he was independent from home with support of significant other. Patient has Medicaid- get medications at [facility] for no more than $3.00. Pt has PCP at [facility]. CM will continue to follow post procedure for additional transition of care needs.                  Expected Discharge Plan: Home/Self Care Barriers to Discharge: Continued Medical Work up(CABG 11-25-18)   Expected Discharge Plan and Services Expected Discharge Plan: Home/Self Care   Discharge Planning Services: CM Consult   Living arrangements for the past 2 months: Mobile Home                  Prior Living Arrangements/Services Living arrangements for the past 2 months: Mobile Home Lives with:: Significant Other, Minor Children Patient language and need for interpreter reviewed:: Yes Do you feel safe going back to the place where you live?: Yes      Need for Family Participation in Patient Care: Yes (Comment) Care giver support system in place?: Yes (comment)   Criminal Activity/Legal Involvement Pertinent to Current Situation/Hospitalization: No - Comment as needed  Activities of Daily Living Home Assistive Devices/Equipment: None ADL Screening (condition at time of admission) Patient's cognitive ability adequate to safely complete daily activities?: Yes Is the patient deaf or have difficulty hearing?: No Does the patient have difficulty seeing, even when wearing glasses/contacts?: No Does the patient have difficulty concentrating, remembering, or making decisions?: No Patient able to express need for assistance with ADLs?:  Yes Does the patient have difficulty dressing or bathing?: No Independently performs ADLs?: Yes (appropriate for developmental age) Does the patient have difficulty walking or climbing stairs?: No Weakness of Legs: None Weakness of Arms/Hands: None  Permission Sought/Granted Permission sought to share information with : Family Supports     Emotional Assessment Appearance:: Appears stated age Attitude/Demeanor/Rapport: Engaged Affect (typically observed): Accepting Orientation: : Oriented to Self, Oriented to Place, Oriented to  Time, Oriented to Situation Alcohol / Substance Use: Not Applicable Psych Involvement: No (comment)  Admission diagnosis:  Ventricular fibrillation (HCC) [I49.01] Atrial fibrillation with RVR (HCC) [I48.91] ST elevation myocardial infarction (STEMI), unspecified artery ([place]) [I21.3] Patient Active Problem List   Diagnosis Date Noted  . NSTEMI (non-ST elevated myocardial infarction) ([place]) 11/21/2018  . Atrial fibrillation with RVR ([place])   . Acute respiratory failure ([place])   . Endotracheally intubated   . Cardiac arrest [facility])    PCP:  [facility] Pharmacy:   [id number] [place], [place] - [address] [place] [address] Phone: [phone] Fax: [phone]     Social Determinants of Health (SDOH) Interventions    Readmission Risk Interventions No flowsheet data found.

## 2018-11-23 NOTE — Consult Note (Signed)
[address]       [place] [address]             [phone]        Macklen Tillman [place] Medical Record #[id number] Date of Birth: Jun 29, 1970  Referring: No ref. provider found Primary Care: [place] Primary Cardiologist:No primary care provider on file.  Chief Complaint:    Chief Complaint  Patient presents with  . post CPR    History of Present Illness:     48 yo male presented to the ED by EMS after sustaining a VF arrest.  CPR was performed, and he received 1 defibrillation.  He was noted to be in atrial fibrillation at admission as well.  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    He has a 2-3 week hx of exertional chest pain, and shortness of breath.  He denies any leg swelling, or orthopnea.  CTS has been consulted to assist with management.   Past Medical and Surgical History: Previous Chest Surgery: none Previous Chest Radiation: none Diabetes Mellitus: no.  HbA1C 5.9 Creatinine: 1.18  Past Medical History:  Diagnosis Date  . Anxiety 11/23/2018  . Restless leg   . Tobacco abuse     Past Surgical History:  Procedure Laterality Date  . LEFT HEART CATH AND CORONARY ANGIOGRAPHY N/A 11/21/2018   Procedure: LEFT HEART CATH AND CORONARY ANGIOGRAPHY;  Surgeon: Burnell Blanks, MD;  Location: [facility];  Service: Cardiovascular;  Laterality: N/A;    Social History: Support: current smoker.  Works at [employer] Use  Smoking Status Current Every Day Smoker  . Packs/day: 1.00  . Types: Cigarettes    Social History   Substance and Sexual Activity  Alcohol Use Not Currently     No Known Allergies  Medications: Asprin: yes Statin: yes Beta Blocker: yes Ace Inhibitor: no Anti-Coagulation: heparin gtt  Current Facility-Administered Medications  Medication Dose Route Frequency Provider Last Rate Last Dose  . 0.9 %  sodium chloride infusion  250 mL  Intravenous PRN Burnell Blanks, MD      . acetaminophen (TYLENOL) tablet 650 mg  650 mg Oral Q4H PRN Burnell Blanks, MD   650 mg at 11/23/18 1497  . aspirin chewable tablet 81 mg  81 mg Oral Daily Burnell Blanks, MD   81 mg at 11/23/18 1038  . atorvastatin (LIPITOR) tablet 80 mg  80 mg Oral q1800 Burnell Blanks, MD   80 mg at 11/22/18 0126  . bisacodyl (DULCOLAX) suppository 10 mg  10 mg Rectal Daily PRN Bowser, Laurel Dimmer, NP      . chlorhexidine gluconate (MEDLINE KIT) (PERIDEX) 0.12 % solution 15 mL  15 mL Mouth Rinse BID Cristal Generous, NP   15 mL at 11/23/18 0834  . Chlorhexidine Gluconate Cloth 2 % PADS 6 each  6 each Topical Q0600 Burnell Blanks, MD   6 each at 11/22/18 1517  . fentaNYL (SUBLIMAZE) bolus via infusion 50 mcg  50 mcg Intravenous Q15 min PRN Bowser, Laurel Dimmer, NP      . fentaNYL 2583mg in NS 2574m(1064mml) infusion-PREMIX  50-200 mcg/hr Intravenous Continuous Bowser, GraLaurel DimmerP   Stopped at 11/22/18 1054  . heparin ADULT infusion 100 units/mL (25000 units/250m17mdium chloride 0.45%)  1,100 Units/hr Intravenous Continuous BitoEinar GradH 11 mL/hr at 11/23/18 1200 1,100 Units/hr at 11/23/18 1200  . metoprolol tartrate (  LOPRESSOR) tablet 25 mg  25 mg Oral BID Burnell Blanks, MD   25 mg at 11/23/18 1039  . midazolam (VERSED) injection 2 mg  2 mg Intravenous Q15 min PRN Cristal Generous, NP   2 mg at 11/21/18 2331  . midazolam (VERSED) injection 2 mg  2 mg Intravenous Q2H PRN Bowser, Laurel Dimmer, NP   2 mg at 11/22/18 1007  . nitroGLYCERIN (NITROSTAT) SL tablet 0.4 mg  0.4 mg Sublingual Q5 Min x 3 PRN Chriss Czar, MD      . ondansetron [facility]) injection 4 mg  4 mg Intravenous Q6H PRN Burnell Blanks, MD      . pantoprazole (PROTONIX) EC tablet 40 mg  40 mg Oral Daily Henri Medal, RPH   40 mg at 11/23/18 1039  . sennosides (SENOKOT) 8.8 MG/5ML syrup 5 mL  5 mL Per Tube BID PRN Bowser, Laurel Dimmer, NP      . sodium  chloride flush (NS) 0.9 % injection 3 mL  3 mL Intravenous Q12H Burnell Blanks, MD   3 mL at 11/23/18 1044  . sodium chloride flush (NS) 0.9 % injection 3 mL  3 mL Intravenous PRN Burnell Blanks, MD        Medications Prior to Admission  Medication Sig Dispense Refill Last Dose  . acetaminophen (TYLENOL) 500 MG tablet Take 1,000 mg by mouth every 6 (six) hours as needed for mild pain or headache.   unk  . Aspirin-Acetaminophen-Caffeine (GOODY HEADACHE PO) Take 1 Package by mouth as needed (headache).   Past Week at Unknown time  . FLUoxetine (PROZAC) 10 MG tablet Take 10 mg by mouth daily.   11/21/2018  . Multiple Vitamin (MULTIVITAMIN) tablet Take 1 tablet by mouth daily.   Past Week at Unknown time  . NON FORMULARY Take 1 tablet by mouth at bedtime. [place] Restless leg tablets   11/20/2018  . omega-3 acid ethyl esters (LOVAZA) 1 g capsule Take 1 g by mouth daily.   11/21/2018    Family History  Problem Relation Age of Onset  . Hypertension Father      Review of Systems:   ROS Constitutional: negative Ears, nose, mouth, throat, and face: negative Respiratory: negative Cardiovascular: chest wall pain Gastrointestinal: negative Musculoskeletal:left groin pain Neurological: negative           Physical Exam: BP 130/82   Pulse 77   Temp 98.5 F (36.9 C) (Oral)   Resp 15   Ht _0  (1.727 m)   Wt 87.9 kg   SpO2 92%   BMI 29.46 kg/m  General:  Appears state stated age.  nontoxic HEENT: NCAT, MMM, OP clear.  good dentition Neuro: Nonfocal, alert, oriented.   Psych: appropriate affect.  Cardiovascular: Sinus, no murmur, no carotid bruit, good peripheral pulses  Pulmonary: EWOB, clear breath sounds GI: NT/ND MSK: ambulates well. Extremities: trace peripheral edema     Diagnostic Studies & Laboratory data:    Left Heart Catherization: 50% LM, Distal LAD stenosis, and D2 stenosis.  Large OM vessel with significant stenosis.  Non obstructive RCA  disease  Echo: Preserved RV and LV function.  No valvular disease   I have independently reviewed the above radiologic studies and discussed with the patient   Recent Lab Findings: Lab Results  Component Value Date   WBC 11.6 (H) 11/23/2018   HGB 14.2 11/23/2018   HCT 44.2 11/23/2018   PLT 210 11/23/2018   GLUCOSE 96 11/23/2018  TRIG 73 11/22/2018   ALT 49 (H) 11/22/2018   AST 48 (H) 11/22/2018   NA 140 11/23/2018   K 4.2 11/23/2018   CL 107 11/23/2018   CREATININE 1.18 11/23/2018   BUN 14 11/23/2018   CO2 25 11/23/2018   TSH 1.746 11/22/2018   INR 1.3 (H) 11/21/2018   HGBA1C 5.9 (H) 11/22/2018      Assessment / Plan:    48 yo male w/ LM/2V CAD, presents status post VFib arrest.  Pt also had an episode of atrial fibrillation this admission.  Now doing better.  Risks, benefits, and alternative discussed.  He is scheduled for a CABG 3/[place] for 7/30     I  spent 30 minutes counseling the patient face to face.   Lajuana Matte 11/23/2018 12:39 PM

## 2018-11-23 NOTE — Progress Notes (Signed)
Progress Note  Patient Name: George Munoz Date of Encounter: 11/23/2018  Primary Cardiologist: New  Subjective   No chest pain or dyspnea. Does not remember any of the events of Sunday.   Inpatient Medications    Scheduled Meds: . aspirin  81 mg Oral Daily  . atorvastatin  80 mg Oral q1800  . chlorhexidine gluconate (MEDLINE KIT)  15 mL Mouth Rinse BID  . Chlorhexidine Gluconate Cloth  6 each Topical Q0600  . insulin aspart  0-15 Units Subcutaneous Q4H  . metoprolol tartrate  2.5 mg Intravenous Q6H  . pantoprazole (PROTONIX) IV  40 mg Intravenous Daily  . sodium chloride flush  3 mL Intravenous Q12H   Continuous Infusions: . sodium chloride    . amiodarone    . fentaNYL infusion INTRAVENOUS Stopped (11/22/18 1054)  . heparin 1,100 Units/hr (11/23/18 0700)   PRN Meds: sodium chloride, acetaminophen, bisacodyl, fentaNYL, midazolam, midazolam, nitroGLYCERIN, ondansetron (ZOFRAN) IV, sennosides, sodium chloride flush   Vital Signs    Vitals:   11/23/18 0500 11/23/18 0600 11/23/18 0700 11/23/18 0804  BP: 128/90 124/84 122/81   Pulse: 73 65 72   Resp: (!) 22 16 15    Temp: (!) 100.4 F (38 C)   98.7 F (37.1 C)  TempSrc:    Oral  SpO2: 95% 97% 98%   Weight:      Height:        Intake/Output Summary (Last 24 hours) at 11/23/2018 0809 Last data filed at 11/23/2018 0700 Gross per 24 hour  Intake 1782.06 ml  Output 1290 ml  Net 492.06 ml   Last 3 Weights 11/22/2018 11/21/2018  Weight (lbs) 193 lb 12.6 oz 200 lb  Weight (kg) 87.9 kg 90.719 kg      Telemetry    Sinus - Personally Reviewed  ECG    No AM EKG- Personally Reviewed  Physical Exam   General: Well developed, well nourished, NAD  HEENT: OP clear, mucus membranes moist  SKIN: warm, dry. No rashes. Neuro: No focal deficits  Musculoskeletal: Muscle strength 5/5 all ext  Psychiatric: Mood and affect normal  Neck: No JVD, no carotid bruits, no thyromegaly, no lymphadenopathy.  Lungs:Clear  bilaterally, no wheezes, rhonci, crackles Cardiovascular: Regular rate and rhythm. No murmurs, gallops or rubs. Abdomen:Soft. Bowel sounds present. Non-tender.  Extremities: No lower extremity edema. Pulses are 2 + in the bilateral DP/PT.   Labs    High Sensitivity Troponin:   Recent Labs  Lab 11/21/18 1933 11/22/18 0914 11/22/18 1449  TROPONINIHS 23* 4,823* 11,291*      Cardiac EnzymesNo results for input(s): TROPONINI in the last 168 hours. No results for input(s): TROPIPOC in the last 168 hours.   Chemistry Recent Labs  Lab 11/21/18 1933 11/21/18 1939 11/21/18 2010 11/22/18 0235 11/23/18 0333  NA 139 141 142 141 140  K 3.6 3.5 3.4* 5.0 4.2  CL 109 108  --  112* 107  CO2 13*  --   --  19* 25  GLUCOSE 273* 269*  --  128* 96  BUN 23* 25*  --  22* 14  CREATININE 1.48* 1.20  --  1.40* 1.18  CALCIUM 8.1*  --   --  8.5* 8.6*  PROT 6.1*  --   --  5.8*  --   ALBUMIN 3.4*  --   --  3.3*  --   AST 50*  --   --  48*  --   ALT 49*  --   --  49*  --  ALKPHOS 58  --   --  50  --   BILITOT 0.6  --   --  0.7  --   GFRNONAA 55*  --   --  59* >60  GFRAA >60  --   --  >60 >60  ANIONGAP 17*  --   --  10 8     Hematology Recent Labs  Lab 11/21/18 1933  11/21/18 2010 11/22/18 0235 11/23/18 0333  WBC 19.8*  --   --  17.3* 11.6*  RBC 5.56  --   --  5.18 4.87  HGB 16.1   < > 15.0 15.0 14.2  HCT 49.9   < > 44.0 45.4 44.2  MCV 89.7  --   --  87.6 90.8  MCH 29.0  --   --  29.0 29.2  MCHC 32.3  --   --  33.0 32.1  RDW 14.6  --   --  14.6 15.4  PLT 307  --   --  248 210   < > = values in this interval not displayed.    BNPNo results for input(s): BNP, PROBNP in the last 168 hours.   DDimer No results for input(s): DDIMER in the last 168 hours.   Radiology    Portable Chest Xray  Result Date: 11/22/2018 CLINICAL DATA:  Intubation. EXAM: PORTABLE CHEST 1 VIEW COMPARISON:  11/21/2018. FINDINGS: Endotracheal tube and NG tube in stable position. Heart size normal. Bibasilar  atelectasis/infiltrates. No pleural effusion or pneumothorax. IMPRESSION: 1.  Lines and tubes in stable position. 2.  Bibasilar atelectasis/infiltrates. Electronically Signed   By: Marcello Moores  Register   On: 11/22/2018 06:20   Dg Chest Port 1 View  Result Date: 11/21/2018 CLINICAL DATA:  Intubation, AMS EXAM: PORTABLE CHEST 1 VIEW COMPARISON:  11/03/2018 FINDINGS: Endotracheal tube is positioned with tip below the thoracic inlet. Esophagogastric tube is positioned with tip and side port below the diaphragm. There appears to be heterogeneous opacity of the infrahilar left lung, although defibrillator leads may exaggerate this appearance. The lungs are otherwise normally aerated. The heart and mediastinum are normal. IMPRESSION: Endotracheal tube is positioned with tip below the thoracic inlet. Esophagogastric tube is positioned with tip and side port below the diaphragm. There appears to be heterogeneous opacity of the infrahilar left lung concerning for infection or aspiration, although defibrillator leads may exaggerate this appearance. The lungs are otherwise normally aerated. The heart and mediastinum are normal. Electronically Signed   By: Eddie Candle M.D.   On: 11/21/2018 19:48    Cardiac Studies   Cardiac cath 11/21/18:  Mid RCA lesion is 40% stenosed.  Prox Cx lesion is 99% stenosed.  1st Mrg lesion is 99% stenosed.  2nd Diag lesion is 90% stenosed.  Ost LM to Dist LM lesion is 50% stenosed.  Prox RCA lesion is 50% stenosed.  There is mild left ventricular systolic dysfunction.  LV end diastolic pressure is normal.  The left ventricular ejection fraction is 45-50% by visual estimate.  There is no mitral valve regurgitation.  Mid LAD lesion is 95% stenosed.   1. Moderate distal left main stenosis 2. Severe mid LAD stenosis 3. Severe mid Circumflex stenosis involving the ostium of a very large obtuse marginal branch 4. Moderate mid RCA stenosis 5. Mild segmental LV systolic  dysfunction (XKGY=18-56%).  6. Post cardiac arrest (ventricular fibrillation) 7. Rapid atrial fibrillation  Echo 11/22/18:  1. The left ventricle has normal systolic function with an ejection fraction of 60-65%. The cavity size was  normal. Left ventricular diastolic parameters were normal.  2. The right ventricle has normal systolic function. The cavity was normal. There is no increase FINDINGS  Left Ventricle: The left ventricle has normal systolic function, with an ejection fraction of 60-65%. The cavity size was normal. There is no increase in left ventricular wall thickness. Left ventricular diastolic parameters were normal.  Right Ventricle: The right ventricle has normal systolic function. The cavity was normal. There is no increase in right ventricular wall thickness.  Left Atrium: Left atrial size was normal in size.  Right Atrium: Right atrial size was normal in size. Right atrial pressure is estimated at 8 mmHg.  Interatrial Septum: No atrial level shunt detected by color flow Doppler.  Pericardium: There is no evidence of pericardial effusion.  Mitral Valve: The mitral valve is normal in structure. Mitral valve regurgitation is trivial by color flow Doppler.  Tricuspid Valve: The tricuspid valve is normal in structure. Tricuspid valve regurgitation is trivial by color flow Doppler.  Aortic Valve: The aortic valve is tricuspid Aortic valve regurgitation was not visualized by color flow Doppler.  Pulmonic Valve: The pulmonic valve was grossly normal. Pulmonic valve regurgitation is not visualized by color flow Doppler.  Aorta: The aorta is normal in size and structure.   Patient Profile     48 y.o. male with h/o depression/anxiety admitted following cardiac arrest at home. CPR by girlfriend for 25 minutes prior to EMS arrival. Rapid atrial fib on EMS arrival then ventricular fibrillation requiring defibrillation. Pt with no purposeful movements on no sedation during  cath. 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 No thrombotic lesions on cath. No coronary intervention performed.   Assessment & Plan    1. CAD/NSTEMI/Cardiac arrest: Pt admitted following cardiac arrest on 11/21/18. He was in atrial fib with RVR and had diffuse ischemic changes on his EKG. Cardiac cath 11/21/18 with severe disease in the Circumflex/large OM, mid LAD and moderate distal left main disease. No coronary intervention performed. He has now extubated and is awake. Echo with normal LV systolic function, normal RV function.  -Will ask CT surgery to see him to discuss CABG which appears to be the best option for revascularization (The angulation of the Circumflex lesion would likely lead to obstruction of the AV groove Circumflex if the stent was placed in the OM. Also moderate left main disease. The LAD lesion could be easily approached with PCI).  -Continue ASA, beta blocker and high intensity statin. Continue IV heparin.   2. Atrial fibrillation with RVR: He is in sinus today. Will stop amiodarone drip. Will continue metoprolol.   3. Respiratory failure: Pt extubated yesterday.    For questions or updates, please contact [place] Please consult [email or web address] for contact info under        Signed, Lauree Chandler, MD  11/23/2018, 8:09 AM

## 2018-11-23 NOTE — Progress Notes (Signed)
ANTICOAGULATION CONSULT NOTE - Follow-Up Consult  Pharmacy Consult for heparin Indication: chest pain/ACS  No Known Allergies  Patient Measurements: Height: 5\' 8"  (172.7 cm) Weight: 193 lb 12.6 oz (87.9 kg) IBW/kg (Calculated) : 68.4 Heparin Dosing Weight: 87 kg  Vital Signs: Temp: 98.7 F (37.1 C) (07/28 0804) Temp Source: Oral (07/28 0804) BP: 135/81 (07/28 0800) Pulse Rate: 74 (07/28 0800)  Labs: Recent Labs    11/21/18 1933 11/21/18 1939 11/21/18 2010 11/21/18 2302 11/22/18 0235 11/22/18 0914 11/22/18 1449 11/23/18 0333  HGB 16.1 16.7 15.0  --  15.0  --   --  14.2  HCT 49.9 49.0 44.0  --  45.4  --   --  44.2  PLT 307  --   --   --  248  --   --  210  APTT  --   --   --  >200*  --   --   --   --   LABPROT  --   --   --  15.5*  --   --   --   --   INR  --   --   --  1.3*  --   --   --   --   HEPARINUNFRC  --   --   --   --   --  0.32 0.36 0.32  CREATININE 1.48* 1.20  --   --  1.40*  --   --  1.18  TROPONINIHS 23*  --   --   --   --  4,540* 11,291*  --     Estimated Creatinine Clearance: 82.5 mL/min (by C-G formula based on SCr of 1.18 mg/dL).   Assessment: 30 yoM admitted on 11/21/2018 with cardiac arrest and s/p cath lab. Pt has multivessel CAD, will need evaluation by CT surgery for CABG. Pharmacy consulted to dose heparin for ACS. No anticoagulation PTA.  Heparin level 0.32 at goal on heparin 1100 units/hr. CBC stable. No reported bleeding.   Goal of Therapy:  Heparin level 0.3-0.7 units/ml Monitor platelets by anticoagulation protocol: Yes   Plan:  Continue heparin 1100 units/hr  Monitor daily heparin level, CBC, and S/S of bleeding    Cristela Felt, PharmD PGY1 Pharmacy Resident [employer]: [phone]  11/23/2018 9:07 AM

## 2018-11-23 NOTE — Progress Notes (Signed)
NAME:  George Munoz, MRN:  [id number], DOB:  December 29, 1970, LOS: 2 ADMISSION DATE:  11/21/2018, CONSULTATION DATE:  11/21/2018 REFERRING MD:  EDP, CHIEF COMPLAINT:  S/p cardiac arrest    Brief History   48 yo M s/p VFib arrest with ROSC. Went to cath lab, multivessel disease however no acute thrombus   History of present illness   History obtained from chart review  48 yo M PMH anxiety, depression who presents 7/26 via [facility]. Ems was dispatched for chest pain. Upon EMS arrival, patient found to be in rapid Atrial fibrillation. En route, patient acutely decompensated. He became unresponsive and rhythm was noted to be Vfib He received 1x defib at 360, received 1 epi, 1 bolus of lido, as well as CPR.  King airway placed by EMS. ROSC achieved after estimated 25 minutes.   In ED, patient reportedly with some spontaneous movement. Brooke Dare exchanged for ETT in ED. Code STEMI activated. Patient to cath lab, noted to have multivessel disease but no acute thrombus.   PCCM consulted for vent management   Past Medical History  Depression Anxiety   Significant Hospital Events   7/26> Vfib arrest, 1x DF, 1x epi, 1x lido, CPR. ROSC after 25 minutes. Intubated. Cath lab reveals no acute thrombus but shows multivessel disease.   Consults:  PCCM   Procedures:  7/26 ETT>>> 7/26 Cardiac cath>>>  Significant Diagnostic Tests:  7/26 CXR> ETT tip below thoracic inlet. L infrahilar opacity.   Micro Data:  7/26 SARS CoV2> negative  7/26 BC x 2 > NGTD  Antimicrobials:  None   Interim history/subjective:  Extubated yesterday without any issue.  Wean down to room air No acute events  Objective   Blood pressure (!) 141/99, pulse 82, temperature 98.7 F (37.1 C), temperature source Oral, resp. rate 15, height 5\' 8"  (1.727 m), weight 87.9 kg, SpO2 96 %.        Intake/Output Summary (Last 24 hours) at 11/23/2018 0954 Last data filed at 11/23/2018 0900 Gross per 24 hour  Intake 1804.08 ml   Output 1390 ml  Net 414.08 ml   Filed Weights   11/21/18 1934 11/22/18 0100  Weight: 90.7 kg 87.9 kg   Blood pressure (!) 141/99, pulse 82, temperature 98.7 F (37.1 C), temperature source Oral, resp. rate 15, height 5\' 8"  (1.727 m), weight 87.9 kg, SpO2 96 %. Gen:      No acute distress HEENT:  EOMI, sclera anicteric Neck:     No masses; no thyromegaly Lungs:    Clear to auscultation bilaterally; normal respiratory effort CV:         Regular rate and rhythm; no murmurs Abd:      + bowel sounds; soft, non-tender; no palpable masses, no distension Ext:    No edema; adequate peripheral perfusion Skin:      Warm and dry; no rash Neuro: alert and oriented x 3 Psych: normal mood and affect  Resolved Hospital Problem list   None  Assessment & Plan:  Patient s/p VFib arrest. PCCM has been consulted for Vent Management.   Acute Respiratory Failure requiring intubation Stable post extubation. Currently on room air  S/p VFib arrest Multivessel CAD  Per cardiology   PCCM will sign off. Please call with any questions  Best practice:  Diet: PO diet Pain/Anxiety/Delirium protocol (if indicated): NA VAP protocol (if indicated):  DVT prophylaxis: Heparin per pharm  GI prophylaxis: Protonix  Glucose control: SSI Mobility: BR  Code Status: Full  Family Communication: Per  Primary  Disposition: ICU   Marshell Garfinkel MD [facility] 11/23/2018, 9:54 AM

## 2018-11-24 ENCOUNTER — Encounter (HOSPITAL_COMMUNITY): Payer: Self-pay

## 2018-11-24 ENCOUNTER — Inpatient Hospital Stay (HOSPITAL_COMMUNITY): Payer: Medicaid Other

## 2018-11-24 ENCOUNTER — Other Ambulatory Visit: Payer: Self-pay

## 2018-11-24 DIAGNOSIS — Z01810 Encounter for preprocedural cardiovascular examination: Secondary | ICD-10-CM

## 2018-11-24 LAB — CBC
HCT: 45.3 % (ref 39.0–52.0)
Hemoglobin: 15 g/dL (ref 13.0–17.0)
MCH: 29 pg (ref 26.0–34.0)
MCHC: 33.1 g/dL (ref 30.0–36.0)
MCV: 87.6 fL (ref 80.0–100.0)
Platelets: 221 10*3/uL (ref 150–400)
RBC: 5.17 MIL/uL (ref 4.22–5.81)
RDW: 14.6 % (ref 11.5–15.5)
WBC: 10.9 10*3/uL — ABNORMAL HIGH (ref 4.0–10.5)
nRBC: 0 % (ref 0.0–0.2)

## 2018-11-24 LAB — PREPARE RBC (CROSSMATCH)

## 2018-11-24 LAB — HEPARIN LEVEL (UNFRACTIONATED)
Heparin Unfractionated: 0.21 IU/mL — ABNORMAL LOW (ref 0.30–0.70)
Heparin Unfractionated: 0.12 IU/mL — ABNORMAL LOW (ref 0.30–0.70)
Heparin Unfractionated: 0.29 IU/mL — ABNORMAL LOW (ref 0.30–0.70)

## 2018-11-24 LAB — PULMONARY FUNCTION TEST
FEF 25-75 Post: 2.23 L/sec
FEF 25-75 Pre: 1.63 L/sec
FEF2575-%Change-Post: 36 %
FEF2575-%Pred-Post: 62 %
FEF2575-%Pred-Pre: 45 %
FEV1-%Change-Post: 12 %
FEV1-%Pred-Post: 46 %
FEV1-%Pred-Pre: 41 %
FEV1-Post: 1.87 L
FEV1-Pre: 1.66 L
FEV1FVC-%Change-Post: 8 %
FEV1FVC-%Pred-Pre: 101 %
FEV6-%Change-Post: 3 %
FEV6-%Pred-Post: 43 %
FEV6-%Pred-Pre: 42 %
FEV6-Post: 2.16 L
FEV6-Pre: 2.1 L
FEV6FVC-%Pred-Post: 103 %
FEV6FVC-%Pred-Pre: 103 %
FVC-%Change-Post: 3 %
FVC-%Pred-Post: 42 %
FVC-%Pred-Pre: 41 %
FVC-Post: 2.16 L
FVC-Pre: 2.1 L
Post FEV1/FVC ratio: 86 %
Post FEV6/FVC ratio: 100 %
Pre FEV1/FVC ratio: 79 %
Pre FEV6/FVC Ratio: 100 %

## 2018-11-24 LAB — BASIC METABOLIC PANEL
Anion gap: 9 (ref 5–15)
BUN: 12 mg/dL (ref 6–20)
CO2: 26 mmol/L (ref 22–32)
Calcium: 8.7 mg/dL — ABNORMAL LOW (ref 8.9–10.3)
Chloride: 105 mmol/L (ref 98–111)
Creatinine, Ser: 1.1 mg/dL (ref 0.61–1.24)
GFR calc Af Amer: >60 mL/min (ref >60)
GFR calc non Af Amer: >60 mL/min (ref >60)
Glucose, Bld: 99 mg/dL (ref 70–99)
Potassium: 3.5 mmol/L (ref 3.5–5.1)
Sodium: 140 mmol/L (ref 135–145)

## 2018-11-24 LAB — ABO/RH: ABO/RH(D): O POS

## 2018-11-24 MED ORDER — ALBUTEROL SULFATE (2.5 MG/3ML) 0.083% IN NEBU
2.5000 mg | INHALATION_SOLUTION | Freq: Once | RESPIRATORY_TRACT | Status: AC
  Administered 2018-11-24: 2.5 mg via RESPIRATORY_TRACT

## 2018-11-24 MED ORDER — TRANEXAMIC ACID 1000 MG/10ML IV SOLN
1.5000 mg/kg/h | INTRAVENOUS | Status: AC
  Administered 2018-11-25: 1.5 mg/kg/h via INTRAVENOUS
  Filled 2018-11-24: qty 25

## 2018-11-24 MED ORDER — BISACODYL 5 MG PO TBEC
5.0000 mg | DELAYED_RELEASE_TABLET | Freq: Once | ORAL | Status: AC
  Administered 2018-11-24: 19:00:00 5 mg via ORAL
  Filled 2018-11-24: qty 1

## 2018-11-24 MED ORDER — PHENYLEPHRINE HCL-NACL 20-0.9 MG/250ML-% IV SOLN
30.0000 ug/min | INTRAVENOUS | Status: DC
  Filled 2018-11-24: qty 250

## 2018-11-24 MED ORDER — CHLORHEXIDINE GLUCONATE CLOTH 2 % EX PADS
6.0000 | MEDICATED_PAD | Freq: Once | CUTANEOUS | Status: AC
  Administered 2018-11-25: 6 via TOPICAL

## 2018-11-24 MED ORDER — NITROGLYCERIN IN D5W 200-5 MCG/ML-% IV SOLN
2.0000 ug/min | INTRAVENOUS | Status: AC
  Administered 2018-11-25: 08:00:00 16.6 ug/min via INTRAVENOUS
  Filled 2018-11-24: qty 250

## 2018-11-24 MED ORDER — DOPAMINE-DEXTROSE 3.2-5 MG/ML-% IV SOLN
0.0000 ug/kg/min | INTRAVENOUS | Status: DC
  Filled 2018-11-24: qty 250

## 2018-11-24 MED ORDER — INSULIN REGULAR(HUMAN) IN NACL 100-0.9 UT/100ML-% IV SOLN
INTRAVENOUS | Status: DC
  Filled 2018-11-24: qty 100

## 2018-11-24 MED ORDER — VANCOMYCIN HCL 10 G IV SOLR
1500.0000 mg | INTRAVENOUS | Status: AC
  Administered 2018-11-25: 1500 mg via INTRAVENOUS
  Filled 2018-11-24: qty 750

## 2018-11-24 MED ORDER — SODIUM CHLORIDE 0.9 % IV SOLN
INTRAVENOUS | Status: DC
  Filled 2018-11-24: qty 30

## 2018-11-24 MED ORDER — METOPROLOL TARTRATE 12.5 MG HALF TABLET
12.5000 mg | ORAL_TABLET | Freq: Once | ORAL | Status: AC
  Administered 2018-11-25: 12.5 mg via ORAL
  Filled 2018-11-24: qty 1

## 2018-11-24 MED ORDER — TRAZODONE HCL 50 MG PO TABS
50.0000 mg | ORAL_TABLET | Freq: Once | ORAL | Status: AC
  Administered 2018-11-24: 50 mg via ORAL
  Filled 2018-11-24: qty 1

## 2018-11-24 MED ORDER — CHLORHEXIDINE GLUCONATE CLOTH 2 % EX PADS
6.0000 | MEDICATED_PAD | Freq: Once | CUTANEOUS | Status: AC
  Administered 2018-11-24: 6 via TOPICAL

## 2018-11-24 MED ORDER — CHLORHEXIDINE GLUCONATE CLOTH 2 % EX PADS: 6.0000 | MEDICATED_PAD | Freq: Every day | CUTANEOUS | Status: DC

## 2018-11-24 MED ORDER — TRANEXAMIC ACID (OHS) PUMP PRIME SOLUTION
2.0000 mg/kg | INTRAVENOUS | Status: DC
  Filled 2018-11-24: qty 1.76

## 2018-11-24 MED ORDER — EPINEPHRINE PF 1 MG/ML IJ SOLN
0.0000 ug/min | INTRAVENOUS | Status: DC
  Filled 2018-11-24: qty 4

## 2018-11-24 MED ORDER — TEMAZEPAM 15 MG PO CAPS
15.0000 mg | ORAL_CAPSULE | Freq: Once | ORAL | Status: AC | PRN
  Administered 2018-11-24: 15 mg via ORAL
  Filled 2018-11-24: qty 1

## 2018-11-24 MED ORDER — NOREPINEPHRINE 4 MG/250ML-% IV SOLN
0.0000 ug/min | INTRAVENOUS | Status: DC
  Filled 2018-11-24: qty 250

## 2018-11-24 MED ORDER — VANCOMYCIN HCL 1000 MG IV SOLR
INTRAVENOUS | Status: DC
  Filled 2018-11-24: qty 1000

## 2018-11-24 MED ORDER — TRANEXAMIC ACID (OHS) BOLUS VIA INFUSION
15.0000 mg/kg | INTRAVENOUS | Status: AC
  Administered 2018-11-25: 1318.5 mg via INTRAVENOUS
  Filled 2018-11-24: qty 1319

## 2018-11-24 MED ORDER — SODIUM CHLORIDE 0.9 % IV SOLN
1.5000 g | INTRAVENOUS | Status: AC
  Administered 2018-11-25: 09:00:00 1.5 g via INTRAVENOUS
  Filled 2018-11-24: qty 1.5

## 2018-11-24 MED ORDER — MILRINONE LACTATE IN DEXTROSE 20-5 MG/100ML-% IV SOLN
0.3000 ug/kg/min | INTRAVENOUS | Status: DC
  Filled 2018-11-24: qty 100

## 2018-11-24 MED ORDER — SODIUM CHLORIDE 0.9 % IV SOLN
750.0000 mg | INTRAVENOUS | Status: DC
  Filled 2018-11-24: qty 750

## 2018-11-24 MED ORDER — DEXMEDETOMIDINE HCL IN NACL 400 MCG/100ML IV SOLN
0.1000 ug/kg/h | INTRAVENOUS | Status: AC
  Administered 2018-11-25: 0.7 ug/kg/h via INTRAVENOUS
  Filled 2018-11-24: qty 100

## 2018-11-24 MED ORDER — POTASSIUM CHLORIDE 2 MEQ/ML IV SOLN
80.0000 meq | INTRAVENOUS | Status: DC
  Filled 2018-11-24: qty 40

## 2018-11-24 MED ORDER — PLASMA-LYTE 148 IV SOLN
INTRAVENOUS | Status: AC
  Administered 2018-11-25: 09:00:00 500 mL
  Filled 2018-11-24: qty 2.5

## 2018-11-24 MED ORDER — CHLORHEXIDINE GLUCONATE 0.12 % MT SOLN
15.0000 mL | Freq: Once | OROMUCOSAL | Status: AC
  Administered 2018-11-25: 05:00:00 15 mL via OROMUCOSAL

## 2018-11-24 MED ORDER — MANNITOL 20 % IV SOLN
Freq: Once | INTRAVENOUS | Status: DC
  Filled 2018-11-24: qty 13

## 2018-11-24 NOTE — Progress Notes (Signed)
Progress Note  Patient Name: George Munoz Date of Encounter: 11/24/2018  Primary Cardiologist: New   Subjective   No events overnight. No chest pain or dyspnea. Pt is confused about why he is here.   Inpatient Medications    Scheduled Meds: . aspirin  81 mg Oral Daily  . atorvastatin  80 mg Oral q1800  . chlorhexidine gluconate (MEDLINE KIT)  15 mL Mouth Rinse BID  . Chlorhexidine Gluconate Cloth  6 each Topical Daily  . metoprolol tartrate  25 mg Oral BID  . pantoprazole  40 mg Oral Daily  . sodium chloride flush  3 mL Intravenous Q12H   Continuous Infusions: . sodium chloride    . fentaNYL infusion INTRAVENOUS Stopped (11/22/18 1054)  . heparin 1,300 Units/hr (11/24/18 0429)   PRN Meds: sodium chloride, acetaminophen, bisacodyl, fentaNYL, midazolam, midazolam, nitroGLYCERIN, ondansetron (ZOFRAN) IV, sennosides, sodium chloride flush   Vital Signs    Vitals:   11/23/18 2300 11/24/18 0000 11/24/18 0141 11/24/18 0200  BP: 138/80 137/79 139/90 123/88  Pulse: 65 60  66  Resp:      Temp:   98.6 F (37 C)   TempSrc:   Oral   SpO2: 98% 99%  95%  Weight:      Height:        Intake/Output Summary (Last 24 hours) at 11/24/2018 0657 Last data filed at 11/24/2018 0400 Gross per 24 hour  Intake 242.1 ml  Output 100 ml  Net 142.1 ml   Last 3 Weights 11/22/2018 11/21/2018  Weight (lbs) 193 lb 12.6 oz 200 lb  Weight (kg) 87.9 kg 90.719 kg      Telemetry    Sinus - Personally Reviewed  ECG    No AM EKG- Personally Reviewed  Physical Exam   General: Well developed, well nourished, NAD  HEENT: OP clear, mucus membranes moist  SKIN: warm, dry. No rashes. Neuro: No focal deficits  Musculoskeletal: Muscle strength 5/5 all ext  Psychiatric: Mood and affect normal  Neck: No JVD, no carotid bruits, no thyromegaly, no lymphadenopathy.  Lungs:Clear bilaterally, no wheezes, rhonci, crackles Cardiovascular: Regular rate and rhythm. No murmurs, gallops or rubs.  Abdomen:Soft. Bowel sounds present. Non-tender.  Extremities: No lower extremity edema. Pulses are 2 + in the bilateral DP/PT.   Labs    High Sensitivity Troponin:   Recent Labs  Lab 11/21/18 1933 11/22/18 0914 11/22/18 1449  TROPONINIHS 23* 4,823* 11,291*      Cardiac EnzymesNo results for input(s): TROPONINI in the last 168 hours. No results for input(s): TROPIPOC in the last 168 hours.   Chemistry Recent Labs  Lab 11/21/18 1933  11/22/18 0235 11/23/18 0333 11/24/18 0310  NA 139   < > 141 140 140  K 3.6   < > 5.0 4.2 3.5  CL 109   < > 112* 107 105  CO2 13*  --  19* 25 26  GLUCOSE 273*   < > 128* 96 99  BUN 23*   < > 22* 14 12  CREATININE 1.48*   < > 1.40* 1.18 1.10  CALCIUM 8.1*  --  8.5* 8.6* 8.7*  PROT 6.1*  --  5.8*  --   --   ALBUMIN 3.4*  --  3.3*  --   --   AST 50*  --  48*  --   --   ALT 49*  --  49*  --   --   ALKPHOS 58  --  50  --   --  BILITOT 0.6  --  0.7  --   --   GFRNONAA 55*  --  59* >60 >60  GFRAA >60  --  >60 >60 >60  ANIONGAP 17*  --  10 8 9    < > = values in this interval not displayed.     Hematology Recent Labs  Lab 11/22/18 0235 11/23/18 0333 11/24/18 0310  WBC 17.3* 11.6* 10.9*  RBC 5.18 4.87 5.17  HGB 15.0 14.2 15.0  HCT 45.4 44.2 45.3  MCV 87.6 90.8 87.6  MCH 29.0 29.2 29.0  MCHC 33.0 32.1 33.1  RDW 14.6 15.4 14.6  PLT 248 210 221    BNPNo results for input(s): BNP, PROBNP in the last 168 hours.   DDimer No results for input(s): DDIMER in the last 168 hours.   Radiology    No results found.  Cardiac Studies   Cardiac cath 11/21/18:  Mid RCA lesion is 40% stenosed.  Prox Cx lesion is 99% stenosed.  1st Mrg lesion is 99% stenosed.  2nd Diag lesion is 90% stenosed.  Ost LM to Dist LM lesion is 50% stenosed.  Prox RCA lesion is 50% stenosed.  There is mild left ventricular systolic dysfunction.  LV end diastolic pressure is normal.  The left ventricular ejection fraction is 45-50% by visual estimate.   There is no mitral valve regurgitation.  Mid LAD lesion is 95% stenosed.   1. Moderate distal left main stenosis 2. Severe mid LAD stenosis 3. Severe mid Circumflex stenosis involving the ostium of a very large obtuse marginal branch 4. Moderate mid RCA stenosis 5. Mild segmental LV systolic dysfunction (ELYH=90-93%).  6. Post cardiac arrest (ventricular fibrillation) 7. Rapid atrial fibrillation  Echo 11/22/18:  1. The left ventricle has normal systolic function with an ejection fraction of 60-65%. The cavity size was normal. Left ventricular diastolic parameters were normal.  2. The right ventricle has normal systolic function. The cavity was normal. There is no increase FINDINGS  Left Ventricle: The left ventricle has normal systolic function, with an ejection fraction of 60-65%. The cavity size was normal. There is no increase in left ventricular wall thickness. Left ventricular diastolic parameters were normal.  Right Ventricle: The right ventricle has normal systolic function. The cavity was normal. There is no increase in right ventricular wall thickness.  Left Atrium: Left atrial size was normal in size.  Right Atrium: Right atrial size was normal in size. Right atrial pressure is estimated at 8 mmHg.  Interatrial Septum: No atrial level shunt detected by color flow Doppler.  Pericardium: There is no evidence of pericardial effusion.  Mitral Valve: The mitral valve is normal in structure. Mitral valve regurgitation is trivial by color flow Doppler.  Tricuspid Valve: The tricuspid valve is normal in structure. Tricuspid valve regurgitation is trivial by color flow Doppler.  Aortic Valve: The aortic valve is tricuspid Aortic valve regurgitation was not visualized by color flow Doppler.  Pulmonic Valve: The pulmonic valve was grossly normal. Pulmonic valve regurgitation is not visualized by color flow Doppler.  Aorta: The aorta is normal in size and structure.    Patient Profile     48 y.o. male with h/o depression/anxiety admitted following cardiac arrest at home. CPR by girlfriend for 25 minutes prior to EMS arrival. Rapid atrial fib on EMS arrival then ventricular fibrillation requiring defibrillation. Pt with no purposeful movements on no sedation during cath. 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 No thrombotic lesions on cath. No  coronary intervention performed. Plans for CABG 11/25/18  Assessment & Plan    1. CAD/NSTEMI/Cardiac arrest: Pt admitted following cardiac arrest on 11/21/18. He was in atrial fib with RVR and had diffuse ischemic changes on his EKG. Cardiac cath 11/21/18 with severe disease in the Circumflex/large OM, mid LAD and moderate distal left main disease. No coronary intervention performed. He has now extubated and is awake. Echo with normal LV systolic function, normal RV function.CABG appears to be the best option for revascularization (The angulation of the Circumflex lesion would likely lead to obstruction of the AV groove Circumflex if the stent was placed in the OM. Also moderate left main disease. The LAD lesion could be easily approached with PCI).  Appreciate CT surgery assistance. Plans for CABG 11/25/18. Will continue ASA, beta blocker, and statin. Continue IV heparin.    2. Atrial fibrillation with RVR: Sinus today. Continue beta blocker.     I updated his girlfriend Larene Beach of the plan by phone. He has been confused so nursing is supportive of keeping him in the ICU today.    For questions or updates, please contact [place] Please consult [email or web address] for contact info under        Signed, Lauree Chandler, MD  11/24/2018, 6:57 AM

## 2018-11-24 NOTE — Progress Notes (Signed)
Pre cabg has been completed.   Preliminary results in CV Proc.   George Munoz 11/24/2018 1:53 PM

## 2018-11-24 NOTE — Progress Notes (Signed)
ANTICOAGULATION CONSULT NOTE - Follow-Up Consult  Pharmacy Consult for heparin Indication: chest pain/ACS  No Known Allergies  Patient Measurements: Height: 5\' 8"  (172.7 cm) Weight: 193 lb 12.6 oz (87.9 kg) IBW/kg (Calculated) : 68.4 Heparin Dosing Weight: 87 kg  Vital Signs: Temp: 98.6 F (37 C) (07/29 0141) Temp Source: Oral (07/29 0141) BP: 123/88 (07/29 0200) Pulse Rate: 66 (07/29 0200)  Labs: Recent Labs    11/21/18 1933 11/21/18 1939  11/21/18 2302 11/22/18 0235  11/22/18 0914 11/22/18 1449 11/23/18 0333 11/24/18 0310  HGB 16.1 16.7   < >  --  15.0  --   --   --  14.2 15.0  HCT 49.9 49.0   < >  --  45.4  --   --   --  44.2 45.3  PLT 307  --   --   --  248  --   --   --  210 221  APTT  --   --   --  >200*  --   --   --   --   --   --   LABPROT  --   --   --  15.5*  --   --   --   --   --   --   INR  --   --   --  1.3*  --   --   --   --   --   --   HEPARINUNFRC  --   --   --   --   --    < > 0.32 0.36 0.32 0.21*  CREATININE 1.48* 1.20  --   --  1.40*  --   --   --  1.18  --   TROPONINIHS 23*  --   --   --   --   --  4,823* 11,291*  --   --    < > = values in this interval not displayed.    Estimated Creatinine Clearance: 82.5 mL/min (by C-G formula based on SCr of 1.18 mg/dL).   Assessment: 12 yoM admitted on 11/21/2018 with cardiac arrest and s/p cath lab. Pt has multivessel CAD, will need evaluation by CT surgery for CABG. Pharmacy consulted to dose heparin for ACS. No anticoagulation PTA.    Heparin level this morning subtherapeutic, no issues with infusion per nursing.   Goal of Therapy:  Heparin level 0.3-0.7 units/ml Monitor platelets by anticoagulation protocol: Yes   Plan:  Increase heparin to 1300 units/hr  Daily heparin level and CBC   Arrie Senate, PharmD, BCPS Clinical Pharmacist Please check AMION for all [place] numbers 11/24/2018

## 2018-11-24 NOTE — Progress Notes (Signed)
ANTICOAGULATION CONSULT NOTE - Follow-Up Consult  Pharmacy Consult for heparin Indication: CAD  No Known Allergies  Patient Measurements: Height: 5\' 8"  (172.7 cm) Weight: 193 lb 12.6 oz (87.9 kg) IBW/kg (Calculated) : 68.4 Heparin Dosing Weight: 87 kg  Vital Signs: Temp: 98.3 F (36.8 C) (07/29 1500) Temp Source: Oral (07/29 1500) BP: 125/76 (07/29 1737)  Labs: Recent Labs    11/22/18 0235  11/22/18 0914 11/22/18 1449 11/23/18 0333 11/24/18 0310 11/24/18 1001 11/24/18 2058  HGB 15.0  --   --   --  14.2 15.0  --   --   HCT 45.4  --   --   --  44.2 45.3  --   --   PLT 248  --   --   --  210 221  --   --   HEPARINUNFRC  --    < > 0.32 0.36 0.32 0.21* 0.12* 0.29*  CREATININE 1.40*  --   --   --  1.18 1.10  --   --   TROPONINIHS  --   --  1,700* 11,291*  --   --   --   --    < > = values in this interval not displayed.    Estimated Creatinine Clearance: 88.5 mL/min (by C-G formula based on SCr of 1.1 mg/dL).   Assessment: 65 yoM admitted on 11/21/2018 with cardiac arrest and s/p cath lab. Pt has multivessel CAD, plan for CABG on 7/30. Pharmacy consulted to dose heparin for ACS. No anticoagulation PTA.    Heparin level is subtherapeutic at 0.29 on heparin 1500 units/hr. No reported bleeding.  Goal of Therapy:  Heparin level 0.3-0.7 units/ml Monitor platelets by anticoagulation protocol: Yes   Plan:  Increase heparin drip to 1600 units/hr  Monitor daily heparin level, CBC, and S/S of bleeding F/U after CABG tomorrow    Thank you for involving pharmacy in this patient's care.  Renold Genta, PharmD, BCPS Clinical Pharmacist Clinical phone for 11/24/2018 until 10:30p is [phone] 11/24/2018 11:12 PM  **Pharmacist phone directory can be found on [email or web address] listed under [place]**

## 2018-11-24 NOTE — Progress Notes (Signed)
ANTICOAGULATION CONSULT NOTE - Follow-Up Consult  Pharmacy Consult for heparin Indication: chest pain/ACS  No Known Allergies  Patient Measurements: Height: 5\' 8"  (172.7 cm) Weight: 193 lb 12.6 oz (87.9 kg) IBW/kg (Calculated) : 68.4 Heparin Dosing Weight: 87 kg  Vital Signs: Temp: 98.7 F (37.1 C) (07/29 1118) Temp Source: Oral (07/29 1118) BP: 126/74 (07/29 0905) Pulse Rate: 66 (07/29 0905)  Labs: Recent Labs    11/21/18 1933  11/21/18 2302 11/22/18 0235  11/22/18 0914 11/22/18 1449 11/23/18 0333 11/24/18 0310 11/24/18 1001  HGB 16.1   < >  --  15.0  --   --   --  14.2 15.0  --   HCT 49.9   < >  --  45.4  --   --   --  44.2 45.3  --   PLT 307  --   --  248  --   --   --  210 221  --   APTT  --   --  >200*  --   --   --   --   --   --   --   LABPROT  --   --  15.5*  --   --   --   --   --   --   --   INR  --   --  1.3*  --   --   --   --   --   --   --   HEPARINUNFRC  --   --   --   --    < > 0.32 0.36 0.32 0.21* 0.12*  CREATININE 1.48*   < >  --  1.40*  --   --   --  1.18 1.10  --   TROPONINIHS 23*  --   --   --   --  6,834* 11,291*  --   --   --    < > = values in this interval not displayed.    Estimated Creatinine Clearance: 88.5 mL/min (by C-G formula based on SCr of 1.1 mg/dL).   Assessment: 68 yoM admitted on 11/21/2018 with cardiac arrest and s/p cath lab. Pt has multivessel CAD, plan for CABG on 7/30. Pharmacy consulted to dose heparin for ACS. No anticoagulation PTA.  Heparin level 0.12 subtherapeutic on heparin 1300 units/hr. CBC stable.  Per nursing no issues with IV access and heparin has not been paused. No reported bleeding.  Goal of Therapy:  Heparin level 0.3-0.7 units/ml Monitor platelets by anticoagulation protocol: Yes   Plan:  Increase heparin to 1500 units/hr  Check heparin level at 2100 Monitor daily heparin level, CBC, and S/S of bleeding    Cristela Felt, PharmD PGY1 Pharmacy Resident [employer]: [phone]  11/24/2018 2:23 PM

## 2018-11-24 NOTE — Progress Notes (Signed)
EVENING ROUNDS NOTE :     [address]       [place], [address]             [phone]                 3 Days Post-Op Procedure(s) (LRB): LEFT HEART CATH AND CORONARY ANGIOGRAPHY (N/A)   Total Length of Stay:  LOS: 3 days  Events:  No events.  Surgery tomorrow.    BP 126/74   Pulse 66   Temp 98.3 F (36.8 C) (Oral)   Resp 16   Ht 5\' 8"  (1.727 m)   Wt 87.9 kg   SpO2 96%   BMI 29.46 kg/m         . sodium chloride    . [START ON 11/25/2018] cefUROXime (ZINACEF)  IV    . cefUROXime (ZINACEF)  IV    . [START ON 11/25/2018] dexmedetomidine    . [START ON 11/25/2018] DOPamine    . [START ON 11/25/2018] epinephrine    . fentaNYL infusion INTRAVENOUS Stopped (11/22/18 1054)  . [START ON 11/25/2018] heparin 30,000 units/NS 1000 mL solution for CELLSAVER    . heparin 1,500 Units/hr (11/24/18 1533)  . [START ON 11/25/2018] milrinone    . [START ON 11/25/2018] nitroGLYCERIN    . [START ON 11/25/2018] norepinephrine    . [START ON 11/25/2018] tranexamic acid (CYKLOKAPRON) infusion ([facility])    . [START ON 11/25/2018] vancomycin      I/O last 3 completed shifts: In: 9147 [P.O.:750; I.V.:655] Out: 590 [Urine:590]   CBC Latest Ref Rng & Units 11/24/2018 11/23/2018 11/22/2018  WBC 4.0 - 10.5 K/uL 10.9(H) 11.6(H) 17.3(H)  Hemoglobin 13.0 - 17.0 g/dL 15.0 14.2 15.0  Hematocrit 39.0 - 52.0 % 45.3 44.2 45.4  Platelets 150 - 400 K/uL 221 210 248    BMP Latest Ref Rng & Units 11/24/2018 11/23/2018 11/22/2018  Glucose 70 - 99 mg/dL 99 96 128(H)  BUN 6 - 20 mg/dL 12 14 22(H)  Creatinine 0.61 - 1.24 mg/dL 1.10 1.18 1.40(H)  Sodium 135 - 145 mmol/L 140 140 141  Potassium 3.5 - 5.1 mmol/L 3.5 4.2 5.0  Chloride 98 - 111 mmol/L 105 107 112(H)  CO2 22 - 32 mmol/L 26 25 19(L)  Calcium 8.9 - 10.3 mg/dL 8.7(L) 8.6(L) 8.5(L)    ABG    Component Value Date/Time   PHART 7.333 (L) 11/21/2018 2010   PCO2ART 31.0 (L) 11/21/2018 2010   PO2ART 270.0 (H) 11/21/2018 2010   HCO3 16.8  (L) 11/21/2018 2010   TCO2 18 (L) 11/21/2018 2010   ACIDBASEDEF 9.0 (H) 11/21/2018 2010   O2SAT 100.0 11/21/2018 2010       [name], MD 11/24/2018 3:51 PM

## 2018-11-24 NOTE — Progress Notes (Signed)
     [address]       [place] [address]             [phone]       No events.  Denies any chest pain  Today's Vitals   11/24/18 0800 11/24/18 0825 11/24/18 0900 11/24/18 0905  BP: 139/83   126/74  Pulse: 76  88 66  Resp:      Temp:  98.2 F (36.8 C)    TempSrc:  Oral    SpO2: 97%  97% 96%  Weight:      Height:      PainSc: 0-No pain      Body mass index is 29.46 kg/m.   I/O last 3 completed shifts: In: 6606 [P.O.:750; I.V.:655] Out: 590 [Urine:590]  Alert NAD Sinus EWOB, clear  CBC Latest Ref Rng & Units 11/24/2018 11/23/2018 11/22/2018  WBC 4.0 - 10.5 K/uL 10.9(H) 11.6(H) 17.3(H)  Hemoglobin 13.0 - 17.0 g/dL 15.0 14.2 15.0  Hematocrit 39.0 - 52.0 % 45.3 44.2 45.4  Platelets 150 - 400 K/uL 221 210 248   BMP Latest Ref Rng & Units 11/24/2018 11/23/2018 11/22/2018  Glucose 70 - 99 mg/dL 99 96 128(H)  BUN 6 - 20 mg/dL 12 14 22(H)  Creatinine 0.61 - 1.24 mg/dL 1.10 1.18 1.40(H)  Sodium 135 - 145 mmol/L 140 140 141  Potassium 3.5 - 5.1 mmol/L 3.5 4.2 5.0  Chloride 98 - 111 mmol/L 105 107 112(H)  CO2 22 - 32 mmol/L 26 25 19(L)  Calcium 8.9 - 10.3 mg/dL 8.7(L) 8.6(L) 8.5(L)   LM, 2V disease s/p Vfib arrest. Doing well OR tomorrow for CABG X 3, atriclip.

## 2018-11-25 ENCOUNTER — Inpatient Hospital Stay (HOSPITAL_COMMUNITY): Payer: Medicaid Other | Admitting: Anesthesiology

## 2018-11-25 ENCOUNTER — Inpatient Hospital Stay (HOSPITAL_COMMUNITY): Payer: Medicaid Other

## 2018-11-25 ENCOUNTER — Inpatient Hospital Stay (HOSPITAL_COMMUNITY)
Admission: EM | Disposition: A | Discharge status: Home / Self Care | Payer: Self-pay | Source: Home / Self Care | Attending: Thoracic Surgery (Cardiothoracic Vascular Surgery)

## 2018-11-25 DIAGNOSIS — Z951 Presence of aortocoronary bypass graft: Secondary | ICD-10-CM

## 2018-11-25 DIAGNOSIS — I25110 Atherosclerotic heart disease of native coronary artery with unstable angina pectoris: Secondary | ICD-10-CM

## 2018-11-25 HISTORY — PX: TEE WITHOUT CARDIOVERSION: SHX5443

## 2018-11-25 HISTORY — PX: CORONARY ARTERY BYPASS GRAFT: SHX141

## 2018-11-25 HISTORY — PX: CLIPPING OF ATRIAL APPENDAGE: SHX5773

## 2018-11-25 LAB — POCT I-STAT 7, (LYTES, BLD GAS, ICA,H+H)
Acid-Base Excess: 1 mmol/L (ref 0.0–2.0)
Acid-base deficit: 2 mmol/L (ref 0.0–2.0)
Acid-base deficit: 3 mmol/L — ABNORMAL HIGH (ref 0.0–2.0)
Acid-base deficit: 6 mmol/L — ABNORMAL HIGH (ref 0.0–2.0)
Acid-base deficit: 5 mmol/L — ABNORMAL HIGH (ref 0.0–2.0)
Bicarbonate: 25.3 mmol/L (ref 20.0–28.0)
Bicarbonate: 22.6 mmol/L (ref 20.0–28.0)
Bicarbonate: 21.9 mmol/L (ref 20.0–28.0)
Bicarbonate: 19.6 mmol/L — ABNORMAL LOW (ref 20.0–28.0)
Bicarbonate: 21.1 mmol/L (ref 20.0–28.0)
Calcium, Ion: 1.05 mmol/L — ABNORMAL LOW (ref 1.15–1.40)
Calcium, Ion: 1.31 mmol/L (ref 1.15–1.40)
Calcium, Ion: 1.23 mmol/L (ref 1.15–1.40)
Calcium, Ion: 1.15 mmol/L (ref 1.15–1.40)
Calcium, Ion: 1.15 mmol/L (ref 1.15–1.40)
HCT: 28 % — ABNORMAL LOW (ref 39.0–52.0)
HCT: 28 % — ABNORMAL LOW (ref 39.0–52.0)
HCT: 31 % — ABNORMAL LOW (ref 39.0–52.0)
HCT: 34 % — ABNORMAL LOW (ref 39.0–52.0)
HCT: 36 % — ABNORMAL LOW (ref 39.0–52.0)
Hemoglobin: 9.5 g/dL — ABNORMAL LOW (ref 13.0–17.0)
Hemoglobin: 9.5 g/dL — ABNORMAL LOW (ref 13.0–17.0)
Hemoglobin: 10.5 g/dL — ABNORMAL LOW (ref 13.0–17.0)
Hemoglobin: 11.6 g/dL — ABNORMAL LOW (ref 13.0–17.0)
Hemoglobin: 12.2 g/dL — ABNORMAL LOW (ref 13.0–17.0)
O2 Saturation: 100 %
O2 Saturation: 97 %
O2 Saturation: 96 %
O2 Saturation: 97 %
O2 Saturation: 96 %
Patient temperature: 36.3
Patient temperature: 37.4
Patient temperature: 37.1
Potassium: 3.5 mmol/L (ref 3.5–5.1)
Potassium: 3.8 mmol/L (ref 3.5–5.1)
Potassium: 3.6 mmol/L (ref 3.5–5.1)
Potassium: 4.3 mmol/L (ref 3.5–5.1)
Potassium: 4.6 mmol/L (ref 3.5–5.1)
Sodium: 141 mmol/L (ref 135–145)
Sodium: 140 mmol/L (ref 135–145)
Sodium: 141 mmol/L (ref 135–145)
Sodium: 142 mmol/L (ref 135–145)
Sodium: 140 mmol/L (ref 135–145)
TCO2: 26 mmol/L (ref 22–32)
TCO2: 24 mmol/L (ref 22–32)
TCO2: 23 mmol/L (ref 22–32)
TCO2: 21 mmol/L — ABNORMAL LOW (ref 22–32)
TCO2: 22 mmol/L (ref 22–32)
pCO2 arterial: 37.8 mmHg (ref 32.0–48.0)
pCO2 arterial: 35.4 mmHg (ref 32.0–48.0)
pCO2 arterial: 37.9 mmHg (ref 32.0–48.0)
pCO2 arterial: 37.4 mmHg (ref 32.0–48.0)
pCO2 arterial: 39.9 mmHg (ref 32.0–48.0)
pH, Arterial: 7.433 (ref 7.350–7.450)
pH, Arterial: 7.413 (ref 7.350–7.450)
pH, Arterial: 7.366 (ref 7.350–7.450)
pH, Arterial: 7.33 — ABNORMAL LOW (ref 7.350–7.450)
pH, Arterial: 7.331 — ABNORMAL LOW (ref 7.350–7.450)
pO2, Arterial: 440 mmHg — ABNORMAL HIGH (ref 83.0–108.0)
pO2, Arterial: 86 mmHg (ref 83.0–108.0)
pO2, Arterial: 84 mmHg (ref 83.0–108.0)
pO2, Arterial: 104 mmHg (ref 83.0–108.0)
pO2, Arterial: 91 mmHg (ref 83.0–108.0)

## 2018-11-25 LAB — POCT I-STAT 4, (NA,K, GLUC, HGB,HCT)
Glucose, Bld: 86 mg/dL (ref 70–99)
Glucose, Bld: 96 mg/dL (ref 70–99)
Glucose, Bld: 94 mg/dL (ref 70–99)
Glucose, Bld: 103 mg/dL — ABNORMAL HIGH (ref 70–99)
Glucose, Bld: 96 mg/dL (ref 70–99)
Glucose, Bld: 82 mg/dL (ref 70–99)
HCT: 34 % — ABNORMAL LOW (ref 39.0–52.0)
HCT: 32 % — ABNORMAL LOW (ref 39.0–52.0)
HCT: 28 % — ABNORMAL LOW (ref 39.0–52.0)
HCT: 26 % — ABNORMAL LOW (ref 39.0–52.0)
HCT: 26 % — ABNORMAL LOW (ref 39.0–52.0)
HCT: 24 % — ABNORMAL LOW (ref 39.0–52.0)
Hemoglobin: 11.6 g/dL — ABNORMAL LOW (ref 13.0–17.0)
Hemoglobin: 10.9 g/dL — ABNORMAL LOW (ref 13.0–17.0)
Hemoglobin: 9.5 g/dL — ABNORMAL LOW (ref 13.0–17.0)
Hemoglobin: 8.8 g/dL — ABNORMAL LOW (ref 13.0–17.0)
Hemoglobin: 8.8 g/dL — ABNORMAL LOW (ref 13.0–17.0)
Hemoglobin: 8.2 g/dL — ABNORMAL LOW (ref 13.0–17.0)
Potassium: 3.5 mmol/L (ref 3.5–5.1)
Potassium: 3.4 mmol/L — ABNORMAL LOW (ref 3.5–5.1)
Potassium: 3.5 mmol/L (ref 3.5–5.1)
Potassium: 3.7 mmol/L (ref 3.5–5.1)
Potassium: 4.3 mmol/L (ref 3.5–5.1)
Potassium: 3.2 mmol/L — ABNORMAL LOW (ref 3.5–5.1)
Sodium: 137 mmol/L (ref 135–145)
Sodium: 140 mmol/L (ref 135–145)
Sodium: 140 mmol/L (ref 135–145)
Sodium: 138 mmol/L (ref 135–145)
Sodium: 139 mmol/L (ref 135–145)
Sodium: 144 mmol/L (ref 135–145)

## 2018-11-25 LAB — BASIC METABOLIC PANEL
Anion gap: 8 (ref 5–15)
BUN: 13 mg/dL (ref 6–20)
CO2: 25 mmol/L (ref 22–32)
Calcium: 8.7 mg/dL — ABNORMAL LOW (ref 8.9–10.3)
Chloride: 105 mmol/L (ref 98–111)
Creatinine, Ser: 1.04 mg/dL (ref 0.61–1.24)
GFR calc Af Amer: >60 mL/min (ref >60)
GFR calc non Af Amer: >60 mL/min (ref >60)
Glucose, Bld: 97 mg/dL (ref 70–99)
Potassium: 3.8 mmol/L (ref 3.5–5.1)
Sodium: 138 mmol/L (ref 135–145)

## 2018-11-25 LAB — CBC
HCT: 29.7 % — ABNORMAL LOW (ref 39.0–52.0)
HCT: 38.5 % — ABNORMAL LOW (ref 39.0–52.0)
Hemoglobin: 9.9 g/dL — ABNORMAL LOW (ref 13.0–17.0)
Hemoglobin: 12.6 g/dL — ABNORMAL LOW (ref 13.0–17.0)
MCH: 29.2 pg (ref 26.0–34.0)
MCH: 29.2 pg (ref 26.0–34.0)
MCHC: 33.3 g/dL (ref 30.0–36.0)
MCHC: 32.7 g/dL (ref 30.0–36.0)
MCV: 87.6 fL (ref 80.0–100.0)
MCV: 89.1 fL (ref 80.0–100.0)
Platelets: 127 10*3/uL — ABNORMAL LOW (ref 150–400)
Platelets: 172 10*3/uL (ref 150–400)
RBC: 3.39 MIL/uL — ABNORMAL LOW (ref 4.22–5.81)
RBC: 4.32 MIL/uL (ref 4.22–5.81)
RDW: 14.4 % (ref 11.5–15.5)
RDW: 14.5 % (ref 11.5–15.5)
WBC: 10 10*3/uL (ref 4.0–10.5)
WBC: 14 10*3/uL — ABNORMAL HIGH (ref 4.0–10.5)
nRBC: 0 % (ref 0.0–0.2)
nRBC: 0 % (ref 0.0–0.2)

## 2018-11-25 LAB — GLUCOSE, CAPILLARY
Glucose-Capillary: 80 mg/dL (ref 70–99)
Glucose-Capillary: 100 mg/dL — ABNORMAL HIGH (ref 70–99)
Glucose-Capillary: 101 mg/dL — ABNORMAL HIGH (ref 70–99)
Glucose-Capillary: 94 mg/dL (ref 70–99)
Glucose-Capillary: 109 mg/dL — ABNORMAL HIGH (ref 70–99)
Glucose-Capillary: 100 mg/dL — ABNORMAL HIGH (ref 70–99)
Glucose-Capillary: 116 mg/dL — ABNORMAL HIGH (ref 70–99)
Glucose-Capillary: 136 mg/dL — ABNORMAL HIGH (ref 70–99)

## 2018-11-25 LAB — POCT I-STAT, CHEM 8
BUN: 10 mg/dL (ref 6–20)
Calcium, Ion: 1.2 mmol/L (ref 1.15–1.40)
Chloride: 106 mmol/L (ref 98–111)
Creatinine, Ser: 0.7 mg/dL (ref 0.61–1.24)
Glucose, Bld: 114 mg/dL — ABNORMAL HIGH (ref 70–99)
HCT: 37 % — ABNORMAL LOW (ref 39.0–52.0)
Hemoglobin: 12.6 g/dL — ABNORMAL LOW (ref 13.0–17.0)
Potassium: 4.6 mmol/L (ref 3.5–5.1)
Sodium: 138 mmol/L (ref 135–145)
TCO2: 22 mmol/L (ref 22–32)

## 2018-11-25 LAB — PROTIME-INR
INR: 1 (ref 0.8–1.2)
INR: 1.4 — ABNORMAL HIGH (ref 0.8–1.2)
Prothrombin Time: 13.3 seconds (ref 11.4–15.2)
Prothrombin Time: 16.5 seconds — ABNORMAL HIGH (ref 11.4–15.2)

## 2018-11-25 LAB — HEMOGLOBIN AND HEMATOCRIT, BLOOD
HCT: 26.4 % — ABNORMAL LOW (ref 39.0–52.0)
Hemoglobin: 8.8 g/dL — ABNORMAL LOW (ref 13.0–17.0)

## 2018-11-25 LAB — CREATININE, SERUM
Creatinine, Ser: 0.96 mg/dL (ref 0.61–1.24)
GFR calc Af Amer: >60 mL/min (ref >60)
GFR calc non Af Amer: >60 mL/min (ref >60)

## 2018-11-25 LAB — MAGNESIUM
Magnesium: 2 mg/dL (ref 1.7–2.4)
Magnesium: 2.9 mg/dL — ABNORMAL HIGH (ref 1.7–2.4)

## 2018-11-25 LAB — ECHO INTRAOPERATIVE TEE
Height: 68 in
Weight: 3100.55 oz

## 2018-11-25 LAB — PLATELET COUNT: Platelets: 162 10*3/uL (ref 150–400)

## 2018-11-25 LAB — APTT: aPTT: 32 seconds (ref 24–36)

## 2018-11-25 LAB — HEPARIN LEVEL (UNFRACTIONATED): Heparin Unfractionated: 0.37 IU/mL (ref 0.30–0.70)

## 2018-11-25 LAB — TRIGLYCERIDES: Triglycerides: 109 mg/dL (ref <150)

## 2018-11-25 SURGERY — CORONARY ARTERY BYPASS GRAFTING (CABG)
Anesthesia: General | Site: Chest

## 2018-11-25 MED ORDER — 0.9 % SODIUM CHLORIDE (POUR BTL) OPTIME
TOPICAL | Status: DC | PRN
  Administered 2018-11-25: 5000 mL

## 2018-11-25 MED ORDER — INSULIN REGULAR(HUMAN) IN NACL 100-0.9 UT/100ML-% IV SOLN: INTRAVENOUS | Status: DC

## 2018-11-25 MED ORDER — HEMOSTATIC AGENTS (NO CHARGE) OPTIME
TOPICAL | Status: DC | PRN
  Administered 2018-11-25 (×2): 1 via TOPICAL

## 2018-11-25 MED ORDER — ASPIRIN 81 MG PO CHEW
324.0000 mg | CHEWABLE_TABLET | Freq: Every day | ORAL | Status: DC
  Filled 2018-11-25: qty 4

## 2018-11-25 MED ORDER — MIDAZOLAM HCL (PF) 10 MG/2ML IJ SOLN
INTRAMUSCULAR | Status: AC
  Filled 2018-11-25: qty 2

## 2018-11-25 MED ORDER — TRAMADOL HCL 50 MG PO TABS
50.0000 mg | ORAL_TABLET | ORAL | Status: DC | PRN
  Administered 2018-11-26: 100 mg via ORAL
  Filled 2018-11-25: qty 2

## 2018-11-25 MED ORDER — PROPOFOL 10 MG/ML IV BOLUS
INTRAVENOUS | Status: AC
  Filled 2018-11-25: qty 20

## 2018-11-25 MED ORDER — OXYCODONE HCL 5 MG PO TABS
5.0000 mg | ORAL_TABLET | ORAL | Status: DC | PRN
  Administered 2018-11-25 – 2018-11-29 (×13): 10 mg via ORAL
  Filled 2018-11-25 (×13): qty 2

## 2018-11-25 MED ORDER — LACTATED RINGERS IV SOLN
INTRAVENOUS | Status: DC | PRN
  Administered 2018-11-25 (×4): via INTRAVENOUS

## 2018-11-25 MED ORDER — ALBUMIN HUMAN 5 % IV SOLN: 250.0000 mL | INTRAVENOUS | Status: AC | PRN

## 2018-11-25 MED ORDER — MIDAZOLAM HCL (PF) 5 MG/ML IJ SOLN
INTRAMUSCULAR | Status: DC | PRN
  Administered 2018-11-25: 3 mg via INTRAVENOUS
  Administered 2018-11-25: 7 mg via INTRAVENOUS

## 2018-11-25 MED ORDER — PANTOPRAZOLE SODIUM 40 MG PO TBEC
40.0000 mg | DELAYED_RELEASE_TABLET | Freq: Every day | ORAL | Status: DC
  Administered 2018-11-27 – 2018-11-29 (×3): 40 mg via ORAL
  Filled 2018-11-25 (×3): qty 1

## 2018-11-25 MED ORDER — CHLORHEXIDINE GLUCONATE 0.12 % MT SOLN
15.0000 mL | OROMUCOSAL | Status: AC
  Administered 2018-11-25: 15 mL via OROMUCOSAL

## 2018-11-25 MED ORDER — INSULIN ASPART 100 UNIT/ML ~~LOC~~ SOLN
0.0000 [IU] | SUBCUTANEOUS | Status: DC
  Administered 2018-11-25: 22:00:00 2 [IU] via SUBCUTANEOUS

## 2018-11-25 MED ORDER — SODIUM CHLORIDE 0.9 % IV SOLN
INTRAVENOUS | Status: DC | PRN
  Administered 2018-11-25: 09:00:00 .7 [IU]/h via INTRAVENOUS

## 2018-11-25 MED ORDER — HEPARIN SODIUM (PORCINE) 1000 UNIT/ML IJ SOLN
INTRAMUSCULAR | Status: DC | PRN
  Administered 2018-11-25: 30000 [IU] via INTRAVENOUS

## 2018-11-25 MED ORDER — PROTAMINE SULFATE 10 MG/ML IV SOLN
INTRAVENOUS | Status: DC | PRN
  Administered 2018-11-25: 10 mg via INTRAVENOUS
  Administered 2018-11-25: 290 mg via INTRAVENOUS

## 2018-11-25 MED ORDER — PHENYLEPHRINE HCL-NACL 20-0.9 MG/250ML-% IV SOLN: 0.0000 ug/min | INTRAVENOUS | Status: DC

## 2018-11-25 MED ORDER — SODIUM CHLORIDE 0.9 % IV SOLN
INTRAVENOUS | Status: DC | PRN
  Administered 2018-11-25 (×2): 50 ug/min via INTRAVENOUS

## 2018-11-25 MED ORDER — METOPROLOL TARTRATE 5 MG/5ML IV SOLN: 2.5000 mg | INTRAVENOUS | Status: DC | PRN

## 2018-11-25 MED ORDER — SODIUM CHLORIDE 0.9 % IV SOLN
INTRAVENOUS | Status: DC | PRN
  Administered 2018-11-25: 11:00:00 750 mg via INTRAVENOUS

## 2018-11-25 MED ORDER — NICARDIPINE HCL IN NACL 20-0.86 MG/200ML-% IV SOLN
3.0000 mg/h | INTRAVENOUS | Status: DC
  Administered 2018-11-25: 14:00:00 3 mg/h via INTRAVENOUS
  Filled 2018-11-25: qty 200

## 2018-11-25 MED ORDER — ACETAMINOPHEN 650 MG RE SUPP
650.0000 mg | Freq: Once | RECTAL | Status: AC
  Administered 2018-11-25: 13:00:00 650 mg via RECTAL

## 2018-11-25 MED ORDER — DOCUSATE SODIUM 100 MG PO CAPS
200.0000 mg | ORAL_CAPSULE | Freq: Every day | ORAL | Status: DC
  Administered 2018-11-26: 200 mg via ORAL
  Filled 2018-11-25 (×4): qty 2

## 2018-11-25 MED ORDER — SODIUM CHLORIDE 0.9% FLUSH
3.0000 mL | INTRAVENOUS | Status: DC | PRN
  Administered 2018-11-26: 10:00:00 3 mL via INTRAVENOUS
  Filled 2018-11-25: qty 3

## 2018-11-25 MED ORDER — SODIUM CHLORIDE 0.9 % IV SOLN
INTRAVENOUS | Status: DC
  Administered 2018-11-25: 12:00:00 via INTRAVENOUS

## 2018-11-25 MED ORDER — BISACODYL 10 MG RE SUPP: 10.0000 mg | Freq: Every day | RECTAL | Status: DC

## 2018-11-25 MED ORDER — ACETAMINOPHEN 160 MG/5ML PO SOLN
1000.0000 mg | Freq: Four times a day (QID) | ORAL | Status: DC
  Administered 2018-11-27: 03:00:00 1000 mg
  Filled 2018-11-25: qty 40.6

## 2018-11-25 MED ORDER — BISACODYL 5 MG PO TBEC
10.0000 mg | DELAYED_RELEASE_TABLET | Freq: Every day | ORAL | Status: DC
  Administered 2018-11-26: 10:00:00 10 mg via ORAL
  Filled 2018-11-25 (×3): qty 2

## 2018-11-25 MED ORDER — OMEGA-3-ACID ETHYL ESTERS 1 G PO CAPS
1.0000 g | ORAL_CAPSULE | Freq: Every day | ORAL | Status: DC
  Administered 2018-11-26: 10:00:00 1 g via ORAL
  Filled 2018-11-25 (×2): qty 1

## 2018-11-25 MED ORDER — PROPOFOL 10 MG/ML IV BOLUS
INTRAVENOUS | Status: DC | PRN
  Administered 2018-11-25: 50 mg via INTRAVENOUS
  Administered 2018-11-25: 25 mg via INTRAVENOUS
  Administered 2018-11-25: 100 mg via INTRAVENOUS
  Administered 2018-11-25: 25 mg via INTRAVENOUS

## 2018-11-25 MED ORDER — ACETAMINOPHEN 160 MG/5ML PO SOLN: 650.0000 mg | Freq: Once | ORAL | Status: AC

## 2018-11-25 MED ORDER — PROTAMINE SULFATE 10 MG/ML IV SOLN
INTRAVENOUS | Status: AC
  Filled 2018-11-25: qty 5

## 2018-11-25 MED ORDER — FLUOXETINE HCL 10 MG PO CAPS
10.0000 mg | ORAL_CAPSULE | Freq: Every day | ORAL | Status: DC
  Administered 2018-11-26 – 2018-11-29 (×4): 10 mg via ORAL
  Filled 2018-11-25 (×4): qty 1

## 2018-11-25 MED ORDER — LACTATED RINGERS IV SOLN
INTRAVENOUS | Status: DC
  Administered 2018-11-26: 01:00:00 via INTRAVENOUS

## 2018-11-25 MED ORDER — VANCOMYCIN HCL IN DEXTROSE 1-5 GM/200ML-% IV SOLN
1000.0000 mg | Freq: Once | INTRAVENOUS | Status: AC
  Administered 2018-11-25: 1000 mg via INTRAVENOUS
  Filled 2018-11-25: qty 200

## 2018-11-25 MED ORDER — METOPROLOL TARTRATE 12.5 MG HALF TABLET
12.5000 mg | ORAL_TABLET | Freq: Two times a day (BID) | ORAL | Status: DC
  Administered 2018-11-26 – 2018-11-27 (×4): 12.5 mg via ORAL
  Filled 2018-11-25 (×5): qty 1

## 2018-11-25 MED ORDER — INSULIN REGULAR BOLUS VIA INFUSION
0.0000 [IU] | Freq: Three times a day (TID) | INTRAVENOUS | Status: DC
  Filled 2018-11-25: qty 10

## 2018-11-25 MED ORDER — METOPROLOL TARTRATE 25 MG/10 ML ORAL SUSPENSION
12.5000 mg | Freq: Two times a day (BID) | ORAL | Status: DC
  Filled 2018-11-25 (×4): qty 5

## 2018-11-25 MED ORDER — LACTATED RINGERS IV SOLN: INTRAVENOUS | Status: DC

## 2018-11-25 MED ORDER — ONDANSETRON HCL 4 MG/2ML IJ SOLN
4.0000 mg | Freq: Four times a day (QID) | INTRAMUSCULAR | Status: DC | PRN
  Administered 2018-11-25 – 2018-11-26 (×2): 4 mg via INTRAVENOUS
  Filled 2018-11-25 (×2): qty 2

## 2018-11-25 MED ORDER — LACTATED RINGERS IV SOLN: 500.0000 mL | Freq: Once | INTRAVENOUS | Status: DC | PRN

## 2018-11-25 MED ORDER — MORPHINE SULFATE (PF) 2 MG/ML IV SOLN
1.0000 mg | INTRAVENOUS | Status: DC | PRN
  Administered 2018-11-25 (×2): 2 mg via INTRAVENOUS
  Administered 2018-11-26: 4 mg via INTRAVENOUS
  Filled 2018-11-25: qty 2
  Filled 2018-11-25 (×2): qty 1

## 2018-11-25 MED ORDER — PROTAMINE SULFATE 10 MG/ML IV SOLN
INTRAVENOUS | Status: AC
  Filled 2018-11-25: qty 25

## 2018-11-25 MED ORDER — ACETAMINOPHEN 500 MG PO TABS
1000.0000 mg | ORAL_TABLET | Freq: Four times a day (QID) | ORAL | Status: DC
  Administered 2018-11-26 – 2018-11-29 (×10): 1000 mg via ORAL
  Filled 2018-11-25 (×11): qty 2

## 2018-11-25 MED ORDER — SODIUM CHLORIDE 0.9 % IV SOLN
1.5000 g | Freq: Two times a day (BID) | INTRAVENOUS | Status: AC
  Administered 2018-11-25 – 2018-11-27 (×4): 1.5 g via INTRAVENOUS
  Filled 2018-11-25 (×5): qty 1.5

## 2018-11-25 MED ORDER — SODIUM CHLORIDE 0.45 % IV SOLN: INTRAVENOUS | Status: DC | PRN

## 2018-11-25 MED ORDER — POTASSIUM CHLORIDE 10 MEQ/50ML IV SOLN
10.0000 meq | INTRAVENOUS | Status: AC
  Administered 2018-11-25 (×3): 10 meq via INTRAVENOUS

## 2018-11-25 MED ORDER — FAMOTIDINE IN NACL 20-0.9 MG/50ML-% IV SOLN
20.0000 mg | Freq: Two times a day (BID) | INTRAVENOUS | Status: AC
  Administered 2018-11-25: 20 mg via INTRAVENOUS

## 2018-11-25 MED ORDER — ORAL CARE MOUTH RINSE
15.0000 mL | Freq: Two times a day (BID) | OROMUCOSAL | Status: DC
  Administered 2018-11-26 – 2018-11-28 (×5): 15 mL via OROMUCOSAL

## 2018-11-25 MED ORDER — DEXMEDETOMIDINE HCL IN NACL 200 MCG/50ML IV SOLN: 0.0000 ug/kg/h | INTRAVENOUS | Status: DC

## 2018-11-25 MED ORDER — FENTANYL CITRATE (PF) 250 MCG/5ML IJ SOLN
INTRAMUSCULAR | Status: DC | PRN
  Administered 2018-11-25 (×6): 250 ug via INTRAVENOUS

## 2018-11-25 MED ORDER — FENTANYL CITRATE (PF) 250 MCG/5ML IJ SOLN
INTRAMUSCULAR | Status: AC
  Filled 2018-11-25: qty 20

## 2018-11-25 MED ORDER — ESMOLOL HCL 100 MG/10ML IV SOLN
INTRAVENOUS | Status: AC
  Filled 2018-11-25: qty 10

## 2018-11-25 MED ORDER — HEPARIN SODIUM (PORCINE) 1000 UNIT/ML IJ SOLN
INTRAMUSCULAR | Status: AC
  Filled 2018-11-25: qty 1

## 2018-11-25 MED ORDER — FENTANYL CITRATE (PF) 250 MCG/5ML IJ SOLN
INTRAMUSCULAR | Status: AC
  Filled 2018-11-25: qty 10

## 2018-11-25 MED ORDER — ROCURONIUM BROMIDE 10 MG/ML (PF) SYRINGE
PREFILLED_SYRINGE | INTRAVENOUS | Status: DC | PRN
  Administered 2018-11-25 (×4): 50 mg via INTRAVENOUS

## 2018-11-25 MED ORDER — MIDAZOLAM HCL 2 MG/2ML IJ SOLN: 2.0000 mg | INTRAMUSCULAR | Status: DC | PRN

## 2018-11-25 MED ORDER — NITROGLYCERIN IN D5W 200-5 MCG/ML-% IV SOLN: 0.0000 ug/min | INTRAVENOUS | Status: DC

## 2018-11-25 MED ORDER — ALBUMIN HUMAN 5 % IV SOLN
INTRAVENOUS | Status: DC | PRN
  Administered 2018-11-25: 11:00:00 via INTRAVENOUS

## 2018-11-25 MED ORDER — SODIUM CHLORIDE 0.9% FLUSH
3.0000 mL | Freq: Two times a day (BID) | INTRAVENOUS | Status: DC
  Administered 2018-11-26 – 2018-11-29 (×4): 3 mL via INTRAVENOUS

## 2018-11-25 MED ORDER — ASPIRIN EC 325 MG PO TBEC
325.0000 mg | DELAYED_RELEASE_TABLET | Freq: Every day | ORAL | Status: DC
  Administered 2018-11-26 – 2018-11-29 (×4): 325 mg via ORAL
  Filled 2018-11-25 (×4): qty 1

## 2018-11-25 MED ORDER — MAGNESIUM SULFATE 4 GM/100ML IV SOLN
4.0000 g | Freq: Once | INTRAVENOUS | Status: AC
  Administered 2018-11-25: 4 g via INTRAVENOUS
  Filled 2018-11-25: qty 100

## 2018-11-25 MED ORDER — SODIUM CHLORIDE 0.9 % IV SOLN: 250.0000 mL | INTRAVENOUS | Status: DC

## 2018-11-25 SURGICAL SUPPLY — 87 items
ATRICLIP EXCLUSION 40 STD HAND (Clip) ×2 IMPLANT
BAG DECANTER FOR FLEXI CONT (MISCELLANEOUS) ×8 IMPLANT
BANDAGE ELASTIC 4 VELCRO ST LF (GAUZE/BANDAGES/DRESSINGS) ×2 IMPLANT
BANDAGE ELASTIC 6 VELCRO ST LF (GAUZE/BANDAGES/DRESSINGS) ×2 IMPLANT
BASKET HEART  (ORDER IN 25'S) (MISCELLANEOUS) ×1
BASKET HEART (ORDER IN 25'S) (MISCELLANEOUS) ×1
BASKET HEART (ORDER IN 25S) (MISCELLANEOUS) ×2 IMPLANT
BLADE CLIPPER SURG (BLADE) IMPLANT
BLADE STERNUM SYSTEM 6 (BLADE) ×4 IMPLANT
BNDG ELASTIC 4X5.8 VLCR STR LF (GAUZE/BANDAGES/DRESSINGS) ×4 IMPLANT
BNDG ELASTIC 6X5.8 VLCR STR LF (GAUZE/BANDAGES/DRESSINGS) ×4 IMPLANT
BNDG GAUZE ELAST 4 BULKY (GAUZE/BANDAGES/DRESSINGS) ×4 IMPLANT
CANISTER SUCT 3000ML PPV (MISCELLANEOUS) ×4 IMPLANT
CANNULA EZ GLIDE AORTIC 21FR (CANNULA) ×8 IMPLANT
CATH CPB KIT HENDRICKSON (MISCELLANEOUS) ×4 IMPLANT
CLIP RETRACTION 3.0MM CORONARY (MISCELLANEOUS) ×4 IMPLANT
CLIP VESOCCLUDE MED 24/CT (CLIP) IMPLANT
CLIP VESOCCLUDE SM WIDE 24/CT (CLIP) IMPLANT
COVER WAND RF STERILE (DRAPES) ×4 IMPLANT
DRAIN CHANNEL 19F RND (DRAIN) ×4 IMPLANT
DRAPE CARDIOVASCULAR INCISE (DRAPES) ×2
DRAPE INCISE IOBAN 66X45 STRL (DRAPES) ×4 IMPLANT
DRAPE SLUSH/WARMER DISC (DRAPES) ×4 IMPLANT
DRAPE SRG 135X102X78XABS (DRAPES) ×2 IMPLANT
DRSG COVADERM 4X14 (GAUZE/BANDAGES/DRESSINGS) ×4 IMPLANT
ELECT REM PT RETURN 9FT ADLT (ELECTROSURGICAL) ×8
ELECTRODE REM PT RTRN 9FT ADLT (ELECTROSURGICAL) ×4 IMPLANT
FELT TEFLON 1X6 (MISCELLANEOUS) ×8 IMPLANT
GAUZE SPONGE 4X4 12PLY STRL (GAUZE/BANDAGES/DRESSINGS) ×8 IMPLANT
GAUZE SPONGE 4X4 12PLY STRL LF (GAUZE/BANDAGES/DRESSINGS) ×4 IMPLANT
GLOVE BIO SURGEON STRL SZ7 (GLOVE) ×8 IMPLANT
GLOVE BIO SURGEON STRL SZ7.5 (GLOVE) ×4 IMPLANT
GLOVE BIOGEL PI IND STRL 6 (GLOVE) IMPLANT
GLOVE BIOGEL PI IND STRL 7.5 (GLOVE) ×4 IMPLANT
GLOVE BIOGEL PI INDICATOR 6 (GLOVE) ×6
GLOVE BIOGEL PI INDICATOR 7.5 (GLOVE) ×4
GOWN STRL REUS W/ TWL LRG LVL3 (GOWN DISPOSABLE) ×8 IMPLANT
GOWN STRL REUS W/ TWL XL LVL3 (GOWN DISPOSABLE) ×4 IMPLANT
GOWN STRL REUS W/TWL LRG LVL3 (GOWN DISPOSABLE) ×8
GOWN STRL REUS W/TWL XL LVL3 (GOWN DISPOSABLE) ×4
HEMOSTAT POWDER SURGIFOAM 1G (HEMOSTASIS) ×10 IMPLANT
KIT BASIN OR (CUSTOM PROCEDURE TRAY) ×4 IMPLANT
KIT SUCTION CATH 14FR (SUCTIONS) ×12 IMPLANT
KIT TURNOVER KIT B (KITS) ×4 IMPLANT
KIT VASOVIEW HEMOPRO 2 VH 4000 (KITS) ×4 IMPLANT
LEAD PACING MYOCARDI (MISCELLANEOUS) ×4 IMPLANT
MARKER GRAFT CORONARY BYPASS (MISCELLANEOUS) ×12 IMPLANT
NS IRRIG 1000ML POUR BTL (IV SOLUTION) ×20 IMPLANT
PACK E OPEN HEART (SUTURE) ×4 IMPLANT
PACK OPEN HEART (CUSTOM PROCEDURE TRAY) ×4 IMPLANT
PAD ARMBOARD 7.5X6 YLW CONV (MISCELLANEOUS) ×8 IMPLANT
PAD ELECT DEFIB RADIOL ZOLL (MISCELLANEOUS) ×4 IMPLANT
PENCIL BUTTON HOLSTER BLD 10FT (ELECTRODE) ×4 IMPLANT
POSITIONER HEAD DONUT 9IN (MISCELLANEOUS) ×4 IMPLANT
PUNCH AORTIC ROTATE 4.0MM (MISCELLANEOUS) IMPLANT
PUNCH AORTIC ROTATE 4.5MM 8IN (MISCELLANEOUS) ×2 IMPLANT
PUNCH AORTIC ROTATE 5MM 8IN (MISCELLANEOUS) IMPLANT
SET CARDIOPLEGIA MPS 5001102 (MISCELLANEOUS) ×2 IMPLANT
SPONGE LAP 18X18 RF (DISPOSABLE) IMPLANT
SPONGE LAP 4X18 RFD (DISPOSABLE) IMPLANT
SUT BONE WAX W31G (SUTURE) ×4 IMPLANT
SUT MNCRL AB 3-0 PS2 18 (SUTURE) ×8 IMPLANT
SUT PDS AB 1 CTX 36 (SUTURE) ×8 IMPLANT
SUT PROLENE 2 0 SH DA (SUTURE) IMPLANT
SUT PROLENE 3 0 SH DA (SUTURE) ×4 IMPLANT
SUT PROLENE 3 0 SH1 36 (SUTURE) IMPLANT
SUT PROLENE 4 0 RB 1 (SUTURE)
SUT PROLENE 4 0 SH DA (SUTURE) IMPLANT
SUT PROLENE 4-0 RB1 .5 CRCL 36 (SUTURE) IMPLANT
SUT PROLENE 5 0 C 1 36 (SUTURE) ×12 IMPLANT
SUT PROLENE 6 0 C 1 30 (SUTURE) IMPLANT
SUT PROLENE 8 0 BV175 6 (SUTURE) IMPLANT
SUT PROLENE BLUE 7 0 (SUTURE) ×4 IMPLANT
SUT PROLENE POLY MONO (SUTURE) IMPLANT
SUT STEEL 6MS V (SUTURE) ×8 IMPLANT
SUT VIC AB 2-0 CT1 27 (SUTURE) ×2
SUT VIC AB 2-0 CT1 TAPERPNT 27 (SUTURE) IMPLANT
SUT VIC AB 3-0 X1 27 (SUTURE) ×2 IMPLANT
SYSTEM SAHARA CHEST DRAIN ATS (WOUND CARE) ×4 IMPLANT
TAPE CLOTH SURG 4X10 WHT LF (GAUZE/BANDAGES/DRESSINGS) ×2 IMPLANT
TAPE PAPER MEDFIX 1IN X 10YD (GAUZE/BANDAGES/DRESSINGS) ×2 IMPLANT
TOWEL GREEN STERILE (TOWEL DISPOSABLE) ×4 IMPLANT
TOWEL GREEN STERILE FF (TOWEL DISPOSABLE) ×4 IMPLANT
TRAY FOLEY SLVR 16FR TEMP STAT (SET/KITS/TRAYS/PACK) ×4 IMPLANT
TUBING LAP HI FLOW INSUFFLATIO (TUBING) ×4 IMPLANT
UNDERPAD 30X30 (UNDERPADS AND DIAPERS) ×4 IMPLANT
WATER STERILE IRR 1000ML POUR (IV SOLUTION) ×8 IMPLANT

## 2018-11-25 NOTE — OR Nursing (Signed)
2 H 20 min call

## 2018-11-25 NOTE — Op Note (Addendum)
[address]       [place] [address]             [phone]                                        11/25/2018 Patient:  George Munoz Pre-Op Dx: CAD, cardiac arrest, paroxysmal atrial fibrillation Post-op Dx:  same Procedure: CABG X 3.  LIMA LAD, RSVG OM1, D2   Placement of a 40mm atriclip on the left atrial appendage Endoscopic greater saphenous vein harvest on the right Intra-operative Transesophageal Echocardiogram  Surgeon and Role:      * Anu Stagner, Lucile Crater, MD - Primary    Evonnie Pat, PA-C - assisting  Anesthesia  general EBL:  538ml Blood Administration: none Xclamp Time:  44 min Pump Time:  33min  Drains: 71 F blake drain: L, mediastinal  Wires: none Counts: correct   Indications: 48 yo male presented to the ED by EMS after sustaining a VF arrest.  CPR was performed, and he received 1 defibrillation.  He was noted to be in atrial fibrillation at admission as well.  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    Findings: Good conduit, good LAD target.  Small diagonal target, good marginal target.  Good flows on all vein grafts.  Good function on post CPB TEE.  Operative Technique: All invasive lines were placed in pre-op holding.  After the risks, benefits and alternatives were thoroughly discussed, the patient was brought to the operative theatre.  Anesthesia was induced, and the patient was prepped and draped in normal sterile fashion.  An appropriate surgical pause was performed, and pre-operative antibiotics were dosed accordingly.  We began with simultaneous incisions were made along the right leg for harvesting of the greater saphenous vein and the chest for the sternotomy.  In regards to the sternotomy, this was carried down with bovie cautery, and the sternum was divided with a reciprocating saw.  Meticulous hemostasis was obtained.  The left internal thoracic artery was exposed and harvested in in pedicled fashion.  The patient was  systemically heparinized, and the artery was divided distally, and placed in a papaverine sponge.    The sternal elevator was removed, and a retractor was placed.  The pericardium was divided in the midline and fashioned into a cradle with pericardial stitches.   After we confirmed an appropriate ACT, the ascending aorta was cannulated in standard fashion.  The right atrial appendage was used for venous cannulation site.  Cardiopulmonary bypass was initiated, and the heart retractor was placed. The cross clamp was applied, and a dose of anterograde cardioplegia was given with good arrest of the heart.  Next we exposed the lateral wall, and found a good target on the marginal.  An end to side anastomosis with the vein graft was then created.  Next, we exposed the anterior wall of the heart and identified a good target on diagonal branch.   An arteriotomy was created.  The vein was anastomosed in an end to side fashion.  We next sized the base of the left atrium appendage, and placed a 26mm atriclip.  Finally, we exposed a good target on the LAD, and fashioned an end to side anastomosis between it and the LITA.  We began to re-warm, and a re-animation dose of cardioplegia was given.  The heart was de-aired, and  the cross clamp was removed.  Meticulous hemostasis was obtained.    A partial occludding clamp was then placed on the ascending aorta, and we created an end to side anastomosis between it and the proximal vein grafts.  The proximal sites were marked with rings.  Hemostasis was obtained, and we separated from cardiopulmonary bypass without event.the heparin was reversed with protamine.  Chest tubes and wires were placed, and the sternum was re-approximated with with sternal wires.  The soft tissue and skin were re-approximated wth absorbable suture.    The patient tolerated the procedure without any immediate complications, and was transferred to the ICU in guarded condition.  Lealer Marsland Keane Scrape

## 2018-11-25 NOTE — Anesthesia Procedure Notes (Signed)
Central Venous Catheter Insertion Performed by: Roberts Gaudy, MD, anesthesiologist Start/End7/30/2020 6:50 AM, 11/25/2018 7:00 AM Patient location: Pre-op. Preanesthetic checklist: patient identified, IV checked, site marked, risks and benefits discussed, surgical consent, monitors and equipment checked, pre-op evaluation, timeout performed and anesthesia consent Lidocaine 1% used for infiltration and patient sedated Hand hygiene performed  and maximum sterile barriers used  Catheter size: 9 Fr Sheath introducer Procedure performed using ultrasound guided technique. Ultrasound Notes:anatomy identified, needle tip was noted to be adjacent to the nerve/plexus identified, no ultrasound evidence of intravascular and/or intraneural injection and image(s) printed for medical record Attempts: 1 Following insertion, line sutured and dressing applied. Post procedure assessment: blood return through all ports, free fluid flow and no air  Patient tolerated the procedure well with no immediate complications.

## 2018-11-25 NOTE — OR Nursing (Signed)
Called 2H for 45 min, to return to room 2h12.

## 2018-11-25 NOTE — Brief Op Note (Signed)
11/21/2018 - 11/25/2018  12:48 PM  PATIENT:  George Munoz  48 y.o. male  PRE-OPERATIVE DIAGNOSIS:  coronary artery disease  POST-OPERATIVE DIAGNOSIS:  coronary artery disease  PROCEDURE:  Procedure(s) with comments: CORONARY ARTERY BYPASS GRAFTING (CABG) times three, using left mammary artery and right greater saphenous vein harvested endoscopically (N/A) CLIPPING OF ATRIAL APPENDAGE (N/A) - FLOW TRACK TRANSESOPHAGEAL ECHOCARDIOGRAM (TEE) (N/A) ENDOSCOPIC GREATER SAPHENOUS VEIN HARVEST(EVH) RIGHT THIGH LIMA-LAD SVG-OM SVG-DISG  SURGEON:  Surgeon(s) and Role:    * Lightfoot, Lucile Crater, MD - Primary  PHYSICIAN ASSISTANT: Vyom Brass PA-C  ANESTHESIA:   epidural  EBL:  450 mL   BLOOD ADMINISTERED:none  DRAINS: CHEST TUBES- LEFT PLEURAL AND PERICARDIAL   LOCAL MEDICATIONS USED:  NONE  SPECIMEN:  No Specimen  DISPOSITION OF SPECIMEN:  N/A  COUNTS:  YES  TOURNIQUET:  * No tourniquets in log *  DICTATION: .Dragon Dictation  PLAN OF CARE: Admit to inpatient   PATIENT DISPOSITION:  ICU - intubated and hemodynamically stable.   Delay start of Pharmacological VTE agent (>24hrs) due to surgical blood loss or risk of bleeding: yes  COMPLICATIONS: NO KNOWN

## 2018-11-25 NOTE — Progress Notes (Signed)
Assisted tele visit to patient with wife.  Dereon Williamsen Parker, RN  

## 2018-11-25 NOTE — Anesthesia Procedure Notes (Signed)
Procedure Name: Intubation Date/Time: 11/25/2018 7:55 AM Performed by: Mariea Clonts, CRNA Pre-anesthesia Checklist: Patient identified, Emergency Drugs available, Suction available and Patient being monitored Patient Re-evaluated:Patient Re-evaluated prior to induction Oxygen Delivery Method: Circle System Utilized Preoxygenation: Pre-oxygenation with 100% oxygen Induction Type: IV induction and Cricoid Pressure applied Ventilation: Mask ventilation without difficulty and Oral airway inserted - appropriate to patient size Laryngoscope Size: Sabra Heck and 2 Grade View: Grade II Tube type: Oral Tube size: 7.5 mm Number of attempts: 1 Airway Equipment and Method: Stylet and Oral airway Placement Confirmation: ETT inserted through vocal cords under direct vision,  positive ETCO2 and breath sounds checked- equal and bilateral Tube secured with: Tape Dental Injury: Teeth and Oropharynx as per pre-operative assessment

## 2018-11-25 NOTE — Anesthesia Procedure Notes (Signed)
Arterial Line Insertion Start/End7/30/2020 7:00 AM, 11/25/2018 7:05 AM Performed by: CRNA  Patient location: Pre-op. Preanesthetic checklist: patient identified, IV checked, site marked, risks and benefits discussed, surgical consent, monitors and equipment checked, pre-op evaluation, timeout performed and anesthesia consent Lidocaine 1% used for infiltration Right, radial was placed Catheter size: 20 G Hand hygiene performed  and maximum sterile barriers used   Attempts: 3 Procedure performed without using ultrasound guided technique. Following insertion, dressing applied and Biopatch. Post procedure assessment: normal  Patient tolerated the procedure well with no immediate complications.

## 2018-11-25 NOTE — Progress Notes (Signed)
Assisted tele visit to patient with family member.  Mattye Verdone Parker, RN  

## 2018-11-25 NOTE — Anesthesia Postprocedure Evaluation (Signed)
Anesthesia Post Note  Patient: George Munoz  Procedure(s) Performed: CORONARY ARTERY BYPASS GRAFTING (CABG) times three, using left mammary artery and right greater saphenous vein harvested endoscopically (N/A Chest) CLIPPING OF ATRIAL APPENDAGE (N/A ) TRANSESOPHAGEAL ECHOCARDIOGRAM (TEE) (N/A )     Patient location during evaluation: SICU Anesthesia Type: General Level of consciousness: sedated and patient remains intubated per anesthesia plan Pain management: pain level controlled Vital Signs Assessment: post-procedure vital signs reviewed and stable Respiratory status: patient remains intubated per anesthesia plan and patient on ventilator - see flowsheet for VS Cardiovascular status: stable and blood pressure returned to baseline Anesthetic complications: no    Last Vitals:  Vitals:   11/25/18 1525 11/25/18 1600  BP: 128/74 110/81  Pulse: 77 72  Resp: (!) 22 14  Temp:  37.4 C  SpO2: 100% 100%    Last Pain:  Vitals:   11/24/18 2000  TempSrc:   PainSc: 0-No pain                 Jerlisa Diliberto COKER

## 2018-11-25 NOTE — Transfer of Care (Signed)
Immediate Anesthesia Transfer of Care Note  Patient: George Munoz  Procedure(s) Performed: CORONARY ARTERY BYPASS GRAFTING (CABG) times three, using left mammary artery and right greater saphenous vein harvested endoscopically (N/A Chest) CLIPPING OF ATRIAL APPENDAGE (N/A ) TRANSESOPHAGEAL ECHOCARDIOGRAM (TEE) (N/A )  Patient Location: SICU  Anesthesia Type:General  Level of Consciousness: sedated and unresponsive  Airway & Oxygen Therapy: Patient remains intubated per anesthesia plan and Patient placed on Ventilator (see vital sign flow sheet for setting)  Post-op Assessment: Report given to RN and Post -op Vital signs reviewed and stable  Post vital signs: Reviewed and stable  Last Vitals:  Vitals Value Taken Time  BP 101/69 11/25/18 1217  Temp    Pulse 71 11/25/18 1220  Resp 12 11/25/18 1220  SpO2 94 % 11/25/18 1220  Vitals shown include unvalidated device data.  Last Pain:  Vitals:   11/24/18 2000  TempSrc:   PainSc: 0-No pain         Complications: No apparent anesthesia complications

## 2018-11-25 NOTE — Anesthesia Procedure Notes (Signed)
Central Venous Catheter Insertion Performed by: Roberts Gaudy, MD, anesthesiologist Start/End7/30/2020 6:50 AM, 11/25/2018 7:00 AM Patient location: Pre-op. Preanesthetic checklist: patient identified, IV checked, site marked, risks and benefits discussed, surgical consent, monitors and equipment checked, pre-op evaluation, timeout performed and anesthesia consent Lidocaine 1% used for infiltration and patient sedated Hand hygiene performed  and maximum sterile barriers used  Catheter size: 8.5 Fr PA cath was placed.Sheath introducer Swan type:thermodilution Procedure performed without using ultrasound guided technique. Ultrasound Notes:anatomy identified, needle tip was noted to be adjacent to the nerve/plexus identified, no ultrasound evidence of intravascular and/or intraneural injection and image(s) printed for medical record Attempts: 1 Following insertion, line sutured, dressing applied and Biopatch. Post procedure assessment: blood return through all ports and free fluid flow  Patient tolerated the procedure well with no immediate complications.

## 2018-11-25 NOTE — Progress Notes (Signed)
Patient ID: George Munoz, male   DOB: December 11, 1970, 48 y.o.   MRN: [id number] EVENING ROUNDS NOTE :     [address]       [place] [address]             [phone]                 Day of Surgery Procedure(s) (LRB): CORONARY ARTERY BYPASS GRAFTING (CABG) times three, using left mammary artery and right greater saphenous vein harvested endoscopically (N/A) CLIPPING OF ATRIAL APPENDAGE (N/A) TRANSESOPHAGEAL ECHOCARDIOGRAM (TEE) (N/A)  Total Length of Stay:  LOS: 4 days  BP 127/87   Pulse 78   Temp 98.4 F (36.9 C)   Resp 13   Ht 5\' 8"  (1.727 m)   Wt 87.9 kg   SpO2 100%   BMI 29.46 kg/m   .Intake/Output      07/29 0701 - 07/30 0700 07/30 0701 - 07/31 0700   I.V. (mL/kg) 266.6 (3) 2500 (28.4)   Blood  200   IV Piggyback  250   Total Intake(mL/kg) 266.6 (3) 2950 (33.6)   Urine (mL/kg/hr)  500 (0.5)   Blood  450   Total Output  950   Net +266.6 +2000        Urine Occurrence 6 x      . sodium chloride 20 mL/hr at 11/25/18 1208  . [START ON 11/26/2018] sodium chloride    . sodium chloride 10 mL/hr at 11/25/18 1217  . albumin human    . cefUROXime (ZINACEF)  IV 1.5 g (11/25/18 1641)  . dexmedetomidine (PRECEDEX) IV infusion 0.7 mcg/kg/hr (11/25/18 1208)  . famotidine (PEPCID) IV 20 mg (11/25/18 1208)  . insulin 0.9 Units/hr (11/25/18 1215)  . lactated ringers    . lactated ringers 20 mL/hr at 11/25/18 1216  . lactated ringers 20 mL/hr at 11/25/18 1220  . magnesium sulfate 4 g (11/25/18 1356)  . niCARDipine Stopped (11/25/18 1416)  . nitroGLYCERIN 20 mcg/min (11/25/18 1257)  . phenylephrine (NEO-SYNEPHRINE) Adult infusion 0 mcg/min (11/25/18 1210)  . vancomycin       Lab Results  Component Value Date   WBC 10.0 11/25/2018   HGB 12.6 (L) 11/25/2018   HCT 37.0 (L) 11/25/2018   PLT 127 (L) 11/25/2018   GLUCOSE 114 (H) 11/25/2018   TRIG 109 11/24/2018   ALT 49 (H) 11/22/2018   AST 48 (H) 11/22/2018   NA 138 11/25/2018   K 4.6 11/25/2018   CL 106 11/25/2018   CREATININE 0.70 11/25/2018   BUN 10 11/25/2018   CO2 25 11/25/2018   TSH 1.746 11/22/2018   INR 1.4 (H) 11/25/2018   HGBA1C 5.9 (H) 11/22/2018    Extubated  Awake and alert A line not functioning Not bleeding CI 3.1  Grace Isaac MD  Beeper [phone] Office [phone] 11/25/2018 6:40 PM

## 2018-11-25 NOTE — Progress Notes (Signed)
     [address]       [place] [address]             [phone]       No events overnight.  Vitals:   11/24/18 2000 11/24/18 2011  BP: 128/78   Pulse:    Resp:    Temp:    SpO2:  97%   Alert NAD Sinus EWOB  Labs reviewed  OR today for CABG, atriclip.

## 2018-11-25 NOTE — Procedures (Signed)
Extubation Procedure Note  Patient Details:   Name: [name] DOB: 11-29-1970 MRN: [id number]   Airway Documentation:    Vent end date: 11/25/18 Vent end time: 1600   Evaluation  O2 sats: stable throughout Complications: No apparent complications Patient did tolerate procedure well. Bilateral Breath Sounds: Clear, Diminished   Yes   RT extubated patient to 3L [place] with RN at bedside per MD order and rapid wean protocol. Positive cuff leak noted. NIF -22 and VC 1100. No stridor noted. Patient tolerated well. RT worked with patient on IS and patient achieved 546ml at this time. RT will continue to monitor as needed.    Vernona Rieger 11/25/2018, 4:13 PM

## 2018-11-25 NOTE — Anesthesia Preprocedure Evaluation (Signed)
Anesthesia Evaluation  Patient identified by MRN, date of birth, ID band Patient awake    Reviewed: Allergy & Precautions, NPO status , Patient's Chart, lab work & pertinent test results  Airway Mallampati: II  TM Distance: >3 FB Neck ROM: Full    Dental  (+) Poor Dentition, Dental Advisory Given   Pulmonary Current Smoker,    breath sounds clear to auscultation       Cardiovascular  Rhythm:Regular Rate:Normal     Neuro/Psych    GI/Hepatic   Endo/Other    Renal/GU      Musculoskeletal   Abdominal   Peds  Hematology   Anesthesia Other Findings   Reproductive/Obstetrics                             Anesthesia Physical Anesthesia Plan  ASA: IV  Anesthesia Plan: General   Post-op Pain Management:    Induction: Intravenous  PONV Risk Score and Plan: Ondansetron and Dexamethasone  Airway Management Planned: Oral ETT  Additional Equipment: Arterial line, CVP, PA Cath, 3D TEE and Ultrasound Guidance Line Placement  Intra-op Plan:   Post-operative Plan: Post-operative intubation/ventilation  Informed Consent: I have reviewed the patients History and Physical, chart, labs and discussed the procedure including the risks, benefits and alternatives for the proposed anesthesia with the patient or authorized representative who has indicated his/her understanding and acceptance.     Dental advisory given  Plan Discussed with: CRNA and Anesthesiologist  Anesthesia Plan Comments:         Anesthesia Quick Evaluation

## 2018-11-26 ENCOUNTER — Telehealth: Payer: Self-pay | Admitting: Cardiovascular Disease

## 2018-11-26 ENCOUNTER — Encounter (HOSPITAL_COMMUNITY): Payer: Self-pay | Admitting: Thoracic Surgery (Cardiothoracic Vascular Surgery)

## 2018-11-26 ENCOUNTER — Inpatient Hospital Stay (HOSPITAL_COMMUNITY): Payer: Medicaid Other

## 2018-11-26 LAB — BASIC METABOLIC PANEL
Anion gap: 7 (ref 5–15)
Anion gap: 9 (ref 5–15)
BUN: 7 mg/dL (ref 6–20)
BUN: 10 mg/dL (ref 6–20)
CO2: 24 mmol/L (ref 22–32)
CO2: 25 mmol/L (ref 22–32)
Calcium: 7.6 mg/dL — ABNORMAL LOW (ref 8.9–10.3)
Calcium: 8.1 mg/dL — ABNORMAL LOW (ref 8.9–10.3)
Chloride: 105 mmol/L (ref 98–111)
Chloride: 102 mmol/L (ref 98–111)
Creatinine, Ser: 0.86 mg/dL (ref 0.61–1.24)
Creatinine, Ser: 0.86 mg/dL (ref 0.61–1.24)
GFR calc Af Amer: >60 mL/min (ref >60)
GFR calc Af Amer: >60 mL/min (ref >60)
GFR calc non Af Amer: >60 mL/min (ref >60)
GFR calc non Af Amer: >60 mL/min (ref >60)
Glucose, Bld: 100 mg/dL — ABNORMAL HIGH (ref 70–99)
Glucose, Bld: 109 mg/dL — ABNORMAL HIGH (ref 70–99)
Potassium: 4.2 mmol/L (ref 3.5–5.1)
Potassium: 4.2 mmol/L (ref 3.5–5.1)
Sodium: 136 mmol/L (ref 135–145)
Sodium: 136 mmol/L (ref 135–145)

## 2018-11-26 LAB — CBC
HCT: 37 % — ABNORMAL LOW (ref 39.0–52.0)
HCT: 36.9 % — ABNORMAL LOW (ref 39.0–52.0)
Hemoglobin: 12 g/dL — ABNORMAL LOW (ref 13.0–17.0)
Hemoglobin: 12 g/dL — ABNORMAL LOW (ref 13.0–17.0)
MCH: 28.8 pg (ref 26.0–34.0)
MCH: 28.8 pg (ref 26.0–34.0)
MCHC: 32.4 g/dL (ref 30.0–36.0)
MCHC: 32.5 g/dL (ref 30.0–36.0)
MCV: 88.7 fL (ref 80.0–100.0)
MCV: 88.7 fL (ref 80.0–100.0)
Platelets: 182 10*3/uL (ref 150–400)
Platelets: 185 10*3/uL (ref 150–400)
RBC: 4.17 MIL/uL — ABNORMAL LOW (ref 4.22–5.81)
RBC: 4.16 MIL/uL — ABNORMAL LOW (ref 4.22–5.81)
RDW: 14.6 % (ref 11.5–15.5)
RDW: 14.6 % (ref 11.5–15.5)
WBC: 11.2 10*3/uL — ABNORMAL HIGH (ref 4.0–10.5)
WBC: 13.4 10*3/uL — ABNORMAL HIGH (ref 4.0–10.5)
nRBC: 0 % (ref 0.0–0.2)
nRBC: 0 % (ref 0.0–0.2)

## 2018-11-26 LAB — GLUCOSE, CAPILLARY
Glucose-Capillary: 100 mg/dL — ABNORMAL HIGH (ref 70–99)
Glucose-Capillary: 103 mg/dL — ABNORMAL HIGH (ref 70–99)
Glucose-Capillary: 118 mg/dL — ABNORMAL HIGH (ref 70–99)
Glucose-Capillary: 84 mg/dL (ref 70–99)
Glucose-Capillary: 87 mg/dL (ref 70–99)
Glucose-Capillary: 92 mg/dL (ref 70–99)

## 2018-11-26 LAB — POCT I-STAT, CHEM 8
BUN: 11 mg/dL (ref 6–20)
Calcium, Ion: 1.22 mmol/L (ref 1.15–1.40)
Chloride: 98 mmol/L (ref 98–111)
Creatinine, Ser: 0.7 mg/dL (ref 0.61–1.24)
Glucose, Bld: 105 mg/dL — ABNORMAL HIGH (ref 70–99)
HCT: 36 % — ABNORMAL LOW (ref 39.0–52.0)
Hemoglobin: 12.2 g/dL — ABNORMAL LOW (ref 13.0–17.0)
Potassium: 4.2 mmol/L (ref 3.5–5.1)
Sodium: 138 mmol/L (ref 135–145)
TCO2: 25 mmol/L (ref 22–32)

## 2018-11-26 LAB — CULTURE, BLOOD (ROUTINE X 2)
Culture: NO GROWTH
Culture: NO GROWTH
Special Requests: ADEQUATE

## 2018-11-26 LAB — MAGNESIUM
Magnesium: 1.9 mg/dL (ref 1.7–2.4)
Magnesium: 2.1 mg/dL (ref 1.7–2.4)

## 2018-11-26 MED ORDER — INSULIN ASPART 100 UNIT/ML ~~LOC~~ SOLN
0.0000 [IU] | SUBCUTANEOUS | Status: DC
  Administered 2018-11-27: 06:00:00 2 [IU] via SUBCUTANEOUS

## 2018-11-26 MED ORDER — ENOXAPARIN SODIUM 40 MG/0.4ML ~~LOC~~ SOLN
40.0000 mg | Freq: Every day | SUBCUTANEOUS | Status: DC
  Administered 2018-11-26: 40 mg via SUBCUTANEOUS
  Filled 2018-11-26 (×2): qty 0.4

## 2018-11-26 MED ORDER — POTASSIUM CHLORIDE CRYS ER 20 MEQ PO TBCR
40.0000 meq | EXTENDED_RELEASE_TABLET | Freq: Once | ORAL | Status: AC
  Administered 2018-11-26: 40 meq via ORAL
  Filled 2018-11-26: qty 2

## 2018-11-26 MED ORDER — CHLORHEXIDINE GLUCONATE CLOTH 2 % EX PADS
6.0000 | MEDICATED_PAD | Freq: Every day | CUTANEOUS | Status: DC
  Administered 2018-11-26 – 2018-11-28 (×3): 6 via TOPICAL

## 2018-11-26 MED ORDER — FUROSEMIDE 40 MG PO TABS
40.0000 mg | ORAL_TABLET | Freq: Every day | ORAL | Status: DC
  Administered 2018-11-26 – 2018-11-29 (×4): 40 mg via ORAL
  Filled 2018-11-26 (×4): qty 1

## 2018-11-26 MED FILL — Potassium Chloride Inj 2 mEq/ML: INTRAVENOUS | Qty: 40 | Status: AC

## 2018-11-26 MED FILL — Heparin Sodium (Porcine) Inj 1000 Unit/ML: INTRAMUSCULAR | Qty: 30 | Status: AC

## 2018-11-26 NOTE — Progress Notes (Addendum)
[facility] DAILY ICU PROGRESS NOTE                   [address]            [place] [address]          [phone]   1 Day Post-Op Procedure(s) (LRB): CORONARY ARTERY BYPASS GRAFTING (CABG) times three, using left mammary artery and right greater saphenous vein harvested endoscopically (N/A) CLIPPING OF ATRIAL APPENDAGE (N/A) TRANSESOPHAGEAL ECHOCARDIOGRAM (TEE) (N/A)  Total Length of Stay:  LOS: 5 days   Subjective: C/O incisional pain  Objective: Vital signs in last 24 hours: Temp:  [97.5 F (36.4 C)-99.5 F (37.5 C)] 99.1 F (37.3 C) (07/31 0444) Pulse Rate:  [66-115] 86 (07/31 0444) Cardiac Rhythm: Normal sinus rhythm (07/30 2025) Resp:  [12-26] 23 (07/31 0444) BP: (108-139)/(71-99) 108/75 (07/31 0400) SpO2:  [94 %-100 %] 100 % (07/31 0444) Arterial Line BP: (67-139)/(64-121) 67/64 (07/31 0300) FiO2 (%):  [40 %-50 %] 40 % (07/30 1525) Weight:  [89.8 kg] 89.8 kg (07/31 0600)  Filed Weights   11/21/18 1934 11/22/18 0100 11/26/18 0600  Weight: 90.7 kg 87.9 kg 89.8 kg    Weight change:    Hemodynamic parameters for last 24 hours: PAP: (25-44)/(8-25) 37/15 CVP:  [8 mmHg-24 mmHg] 20 mmHg CO:  [3.6 L/min-7.5 L/min] 5.9 L/min CI:  [1.8 L/min/m2-3.7 L/min/m2] 2.9 L/min/m2  Intake/Output from previous day: 07/30 0701 - 07/31 0700 In: 4466.8 [I.V.:3422; Blood:200; IV Piggyback:844.8] Out: 3635 [Urine:2715; Blood:450; Chest Tube:470]  Intake/Output this shift: No intake/output data recorded.  Current Meds: Scheduled Meds: . acetaminophen  1,000 mg Oral Q6H   Or  . acetaminophen (TYLENOL) oral liquid 160 mg/5 mL  1,000 mg Per Tube Q6H  . aspirin EC  325 mg Oral Daily   Or  . aspirin  324 mg Per Tube Daily  . atorvastatin  80 mg Oral q1800  . bisacodyl  10 mg Oral Daily   Or  . bisacodyl  10 mg Rectal Daily  . docusate sodium  200 mg Oral Daily  . FLUoxetine  10 mg Oral Daily  . insulin aspart  0-24 Units Subcutaneous Q4H  . insulin regular   0-10 Units Intravenous TID WC  . mouth rinse  15 mL Mouth Rinse BID  . metoprolol tartrate  12.5 mg Oral BID   Or  . metoprolol tartrate  12.5 mg Per Tube BID  . omega-3 acid ethyl esters  1 g Oral Daily  . [START ON 11/27/2018] pantoprazole  40 mg Oral Daily  . sodium chloride flush  3 mL Intravenous Q12H   Continuous Infusions: . sodium chloride 20 mL/hr at 11/25/18 1900  . sodium chloride    . sodium chloride 10 mL/hr at 11/25/18 1217  . albumin human    . cefUROXime (ZINACEF)  IV 1.5 g (11/26/18 0432)  . dexmedetomidine (PRECEDEX) IV infusion 0.1 mcg/kg/hr (11/26/18 0023)  . famotidine (PEPCID) IV Stopped (11/25/18 1249)  . lactated ringers    . lactated ringers 20 mL/hr at 11/25/18 1216  . lactated ringers 20 mL/hr at 11/26/18 0031  . niCARDipine Stopped (11/25/18 1416)  . nitroGLYCERIN 20 mcg/min (11/25/18 1257)  . phenylephrine (NEO-SYNEPHRINE) Adult infusion 20 mcg/min (11/25/18 1900)   PRN Meds:.sodium chloride, albumin human, lactated ringers, metoprolol tartrate, midazolam, morphine injection, ondansetron (ZOFRAN) IV, oxyCODONE, sodium chloride flush, traMADol  General appearance: alert, cooperative and no distress Heart: regular rate and rhythm Lungs: dim in bases Abdomen: benign Extremities: no  edema Wound: incis healing well  Lab Results: CBC: Recent Labs    11/25/18 1814 11/26/18 0427  WBC 14.0* 11.2*  HGB 12.6* 12.0*  HCT 38.5* 37.0*  PLT 172 182   BMET:  Recent Labs    11/25/18 0546  11/25/18 1809 11/25/18 1814 11/26/18 0427  NA 138   < > 138  --  136  K 3.8   < > 4.6  --  4.2  CL 105  --  106  --  105  CO2 25  --   --   --  24  GLUCOSE 97   < > 114*  --  100*  BUN 13  --  10  --  7  CREATININE 1.04  --  0.70 0.96 0.86  CALCIUM 8.7*  --   --   --  7.6*   < > = values in this interval not displayed.    CMET: Lab Results  Component Value Date   WBC 11.2 (H) 11/26/2018   HGB 12.0 (L) 11/26/2018   HCT 37.0 (L) 11/26/2018   PLT 182  11/26/2018   GLUCOSE 100 (H) 11/26/2018   TRIG 109 11/24/2018   ALT 49 (H) 11/22/2018   AST 48 (H) 11/22/2018   NA 136 11/26/2018   K 4.2 11/26/2018   CL 105 11/26/2018   CREATININE 0.86 11/26/2018   BUN 7 11/26/2018   CO2 24 11/26/2018   TSH 1.746 11/22/2018   INR 1.4 (H) 11/25/2018   HGBA1C 5.9 (H) 11/22/2018      PT/INR:  Recent Labs    11/25/18 1211  LABPROT 16.5*  INR 1.4*   Radiology: Dg Chest Port 1 View  Result Date: 11/26/2018 CLINICAL DATA:  Chest tube.  CABG. EXAM: PORTABLE CHEST 1 VIEW COMPARISON:  11/25/2018. FINDINGS: Interim extubation and removal of NG tube. Mediastinal drainage tube and left chest tube in stable position. Prior CABG. Heart size stable. Low lung volumes with progressive mild atelectatic changes both lungs. Left base infiltrate again cannot be excluded. Tiny left pleural effusion cannot be excluded. No pneumothorax. IMPRESSION: 1. Prior CABG. Interim extubation removal of NG tube. Mediastinal drainage tube and left chest tube in stable position. No pneumothorax. 2. Low lung volumes with progressive mild atelectatic changes both lungs. Left base infiltrate again cannot be excluded. Tiny left pleural effusion cannot be excluded. Electronically Signed   By: Maisie Fushomas  Register   On: 11/26/2018 06:28   Dg Chest Port 1 View  Result Date: 11/25/2018 CLINICAL DATA:  Status post CABG EXAM: PORTABLE CHEST 1 VIEW COMPARISON:  November 22, 2018 FINDINGS: Patient status post median sternotomy and CABG. The heart size is normal. Endotracheal tube is identified with distal tip 5.2 cm from carina. Swan-Ganz catheter, mediastinal drain and left chest tube, nasogastric tube are identified. There is patchy consolidation of left lung base with associated left pleural effusion. The right lung is clear. The visualized skeletal structures are stable. IMPRESSION: Postsurgical changes as described. Life supporting devices are in good position as described. Patchy consolidation of left  lung base with associated small left pleural effusion. Electronically Signed   By: Sherian ReinWei-Chen  Lin M.D.   On: 11/25/2018 12:59     Assessment/Plan: S/P Procedure(s) (LRB): CORONARY ARTERY BYPASS GRAFTING (CABG) times three, using left mammary artery and right greater saphenous vein harvested endoscopically (N/A) CLIPPING OF ATRIAL APPENDAGE (N/A) TRANSESOPHAGEAL ECHOCARDIOGRAM (TEE) (N/A)  1 doing well POD#1 CABGx3 2 excellent hemodynamics on no pressors/inotropes, sinus rhythm, no fevers 3 sats good on  RA- routine pulm toilet , CXR shows atx 4 excellent UOP, normal renal fxn/GFR, cont spont diuresis 5 very minor ABL anemia 6 minor leukocytosis, systemic inflammation 7 BS well controlled, not a diabetic 8 CT 470 cc yesterday, cont CT for now 9 routine cardiac rehab 10 d/c sganz and a line 11 see progression orders John Giovanni PA-C 11/26/2018 7:21 AM  Pager [phone]  Agree with above.  Doing well Will remove swan, a-line and foley On asp, statin, and BB HgA1C < 6.5 so will switch to [place] to start diuresis today Ambulation, up to chair Floor this afternoon

## 2018-11-26 NOTE — Progress Notes (Signed)
[facility] BRIEF SICU PROGRESS NOTE  1 Day Post-Op  S/P Procedure(s) (LRB): CORONARY ARTERY BYPASS GRAFTING (CABG) times three, using left mammary artery and right greater saphenous vein harvested endoscopically (N/A) CLIPPING OF ATRIAL APPENDAGE (N/A) TRANSESOPHAGEAL ECHOCARDIOGRAM (TEE) (N/A)   Stable day NSR w/ stable BP Breathing comfortably w/ O2 sats 94-97% on 3 L/min via [place] UOP adequate Labs okay  Plan: Continue current plan  Rexene Alberts, MD 11/26/2018 6:07 PM

## 2018-11-26 NOTE — Discharge Summary (Signed)
Physician Discharge Summary  Patient ID: George Munoz MRN: [id number] DOB/AGE: 05-18-1970 48 y.o.  Admit date: 11/21/2018 Discharge date: 11/29/2018  Admission Diagnoses: Cardiac arrest  Discharge Diagnoses:  Principal Problem:   Cardiac arrest [facility]) Active Problems:   Atrial fibrillation with RVR (HCC)   NSTEMI (non-ST elevated myocardial infarction) (HCC)   Acute respiratory failure (HCC)   Endotracheally intubated   S/P CABG x 3  Patient Active Problem List   Diagnosis Date Noted  . S/P CABG x 3 11/25/2018  . NSTEMI (non-ST elevated myocardial infarction) (HCC) 11/21/2018  . Atrial fibrillation with RVR (HCC)   . Acute respiratory failure (HCC)   . Endotracheally intubated   . Cardiac arrest [facility])    History of present illness: The patient is a 48 year old male who presented to the emergency department via EMS.  On the date of admission the patient complained of chest pain with some nausea and became unresponsive at home.  His girlfriend performed CPR and EMS was called.  He received 1 shock for what was called ventricular fibrillation and he was also noted to be in atrial fibrillation.  He was brought to the emergency room where twelve-lead ECG demonstrated inferior ST elevations.  He was intubated for airway protection.  He was admitted for further evaluation and treatment to include cardiology consultation.  Discharged Condition: good  Hospital Course: The patient was admitted for further treatment and cardiology consultation was obtained.  He was placed on the ventilator and he showed improving neurological exam so therapeutic hypothermia was not felt to be warranted.  He was treated with supportive care and additionally underwent cardiac catheterization which revealed multivessel coronary artery disease without an obvious culprit vessel.  Surgical consultation was obtained with Dr. Cliffton Asters who evaluated the patient and studies and agree with recommendations to proceed  with coronary artery surgical revascularization.  Additionally it was felt due to the atrial fibrillation that placement of a left atrial clip was warranted.  The patient continued to medically stabilized and on 11/25/2018 he was taken to the operating room where he underwent the below described procedure.  He tolerated well was taken to the surgical intensive care unit in stable condition.  Postoperative hospital course: As dictated by Gershon Crane PA-C: The patient has done well postoperatively.  He has maintained stable hemodynamics.  He was weaned from the ventilator without difficulty.  All routine lines, monitors and drainage devices have been discontinued in the standard fashion.  He has a very mild acute blood loss anemia.  He has mild postoperative volume overload.  He did respond to diuretics.  Addendum: Patient has maintained sinus rhythm. He was requiring 2-3 liters of oxygen via [place] on 08/01. He was later weaned to room air. Chest tubes were removed on 08/01. Patient's glucose remained well controlled. His pre op HGA1C was 5.9. He likely has pre diabetes. He will be provided with nutritional information at discharge and will need follow up with medical doctor after discharge. His Lopressor was increased for better HR and BP control. He was started on Lisinopril for better BP control as well. Epicardial pacing wires were removed on 08/01.Patient had questionable a fib after cardiac arrest;he has none post op. Cardiology recommended event monitor (to be arranged after discharge) for "silent a fib".  Cardiology also reccommended Plavix 75 mg daily with ecasa 81 mg daily as s/p NSTEMI. Dr. Cliffton Asters in agreement. Also, patient's LVEF is NOT less than 35%, so no need for LifeVest. As discussed with Dr. Cliffton Asters,  patient is felt surgically stable for discharge today.  Consults: cardiology  Significant Diagnostic Studies: angiography: Cardiac cath. TEE- intra-op  Treatments: surgery:   11/25/2018 Patient:  [name] Pre-Op Dx: CAD, cardiac arrest, paroxysmal atrial fibrillation Post-op Dx:  same Procedure: CABG X 3.  LIMA LAD, RSVG OM1, D2   Placement of a 40mm atriclip on the left atrial appendage Endoscopic greater saphenous vein harvest on the right Intra-operative Transesophageal Echocardiogram  Surgeon and Role:      * Lightfoot, Eliezer LoftsHarrell O, MD - Primary    Webb Laws* W. Gold, PA-C - assisting  Discharge Exam: Blood pressure 133/74, pulse (!) 103, temperature 99 F (37.2 C), temperature source Oral, resp. rate (!) 23, height 5\' 8"  (1.727 m), weight 84.8 kg, SpO2 96 %. Cardiovascular: RRR Pulmonary: Slightly diminished bibasilar breath sounds Abdomen: Soft, non tender, bowel sounds present. Extremities: Trace bilateral lower extremity edema. Wounds: Sternal wound is clean and dry.  No erythema or signs of infection. Small eschar forming under distal sternal wound (likely from defib)  Disposition: Discharge disposition: 01-Home or Self Care       Discharge Instructions    Amb Referral to Cardiac Rehabilitation   Complete by: As directed    Referring to [place] CRP 2   Diagnosis:  CABG STEMI     CABG X ___: 3   After initial evaluation and assessments completed: Virtual Based Care may be provided alone or in conjunction with Phase 2 Cardiac Rehab based on patient barriers.: Yes     The patient has been discharged on:   1.Beta Blocker:  Yes [ x  ]                              No   [   ]                              If No, reason:  2.Ace Inhibitor/ARB: Yes [x   ]                                     No  [    ]                                     If No, reason:  3.Statin:   Yes [ x  ]                  No  [   ]                  If No, reason:  4.Ecasa:  Yes  [ x  ]                  No   [   ]                  If No, reason:  Allergies as of 11/29/2018   No Known Allergies     Medication List    STOP taking these medications   GOODY  HEADACHE PO     TAKE these medications   acetaminophen 500 MG tablet Commonly known as: TYLENOL Take 1,000 mg by mouth every 6 (six) hours as needed for mild pain  or headache.   aspirin EC 81 MG tablet Take 1 tablet (81 mg total) by mouth daily.   atorvastatin 80 MG tablet Commonly known as: LIPITOR Take 1 tablet (80 mg total) by mouth daily at 6 PM.   clopidogrel 75 MG tablet Commonly known as: Plavix Take 1 tablet (75 mg total) by mouth daily.   FLUoxetine 10 MG tablet Commonly known as: PROZAC Take 10 mg by mouth daily.   furosemide 40 MG tablet Commonly known as: LASIX Take 1 tablet (40 mg total) by mouth daily. For 4 days then stop.   metoprolol tartrate 25 MG tablet Commonly known as: LOPRESSOR Take 1 tablet (25 mg total) by mouth 2 (two) times daily.   multivitamin tablet Take 1 tablet by mouth daily.   NON FORMULARY Take 1 tablet by mouth at bedtime. [place] leg tablets   omega-3 acid ethyl esters 1 g capsule Commonly known as: LOVAZA Take 1 g by mouth daily.   oxyCODONE 5 MG immediate release tablet Commonly known as: Oxy IR/ROXICODONE Take 1 tablet (5 mg total) by mouth every 4 (four) hours as needed for severe pain.   potassium chloride SA 20 MEQ tablet Commonly known as: K-DUR Take 1 tablet (20 mEq total) by mouth daily. For 4 days then stop.      Follow-up Information    Lightfoot, Lucile Crater, MD Follow up.   Specialty: Thoracic Surgery Why: please see discharge paperwork for follow-up appointment with Dr. Kipp Brood.  Please obtain a chest x-ray at [place] 1/2-hour prior to this appointment.  It is located in the same office complex on the first floor. Contact information: [place] [address] [phone]        Daune Perch, NP Follow up.   Specialty: Cardiology Why: [facility] - 12/08/18 at 9am. Please arrive 15 minutes prior to appointment to check in. Pecolia Ades is one of the nurse  practitioners that works with our cardiology team. Contact information: [place] [address] [phone]        [place] Office Follow up.   Specialty: Cardiology Why: The office will call you to arrange a 30 day heart monitor to wear to assess for any abnormal heart rhythms. Contact information: [address] [place] [phone]          Signed: Arnoldo Lenis 11/29/2018, 12:24 PM

## 2018-11-26 NOTE — Progress Notes (Signed)
Pt transported to 4E-19 via bed, tolerated NAD

## 2018-11-26 NOTE — Progress Notes (Signed)
Progress Note  Patient Name: George Munoz Date of Encounter: 11/26/2018  Primary Cardiologist: New/Porchia Sinkler  Subjective   Chest wall sore.   Inpatient Medications    Scheduled Meds: . acetaminophen  1,000 mg Oral Q6H   Or  . acetaminophen (TYLENOL) oral liquid 160 mg/5 mL  1,000 mg Per Tube Q6H  . aspirin EC  325 mg Oral Daily   Or  . aspirin  324 mg Per Tube Daily  . atorvastatin  80 mg Oral q1800  . bisacodyl  10 mg Oral Daily   Or  . bisacodyl  10 mg Rectal Daily  . docusate sodium  200 mg Oral Daily  . enoxaparin (LOVENOX) injection  40 mg Subcutaneous QHS  . FLUoxetine  10 mg Oral Daily  . insulin aspart  0-24 Units Subcutaneous Q4H  . mouth rinse  15 mL Mouth Rinse BID  . metoprolol tartrate  12.5 mg Oral BID   Or  . metoprolol tartrate  12.5 mg Per Tube BID  . omega-3 acid ethyl esters  1 g Oral Daily  . [START ON 11/27/2018] pantoprazole  40 mg Oral Daily  . sodium chloride flush  3 mL Intravenous Q12H   Continuous Infusions: . sodium chloride 20 mL/hr at 11/25/18 1900  . sodium chloride    . sodium chloride 10 mL/hr at 11/25/18 1217  . albumin human    . cefUROXime (ZINACEF)  IV 1.5 g (11/26/18 0432)  . dexmedetomidine (PRECEDEX) IV infusion 0.1 mcg/kg/hr (11/26/18 0023)  . famotidine (PEPCID) IV Stopped (11/25/18 1249)  . lactated ringers    . lactated ringers 20 mL/hr at 11/25/18 1216  . lactated ringers 20 mL/hr at 11/26/18 0031  . niCARDipine Stopped (11/25/18 1416)  . nitroGLYCERIN 20 mcg/min (11/25/18 1257)  . phenylephrine (NEO-SYNEPHRINE) Adult infusion 20 mcg/min (11/25/18 1900)   PRN Meds: sodium chloride, albumin human, lactated ringers, metoprolol tartrate, midazolam, morphine injection, ondansetron (ZOFRAN) IV, oxyCODONE, sodium chloride flush, traMADol   Vital Signs    Vitals:   11/26/18 0444 11/26/18 0600 11/26/18 0614 11/26/18 0700  BP:    121/83  Pulse: 86  84 86  Resp: (!) 23  (!) 24 20  Temp: 99.1 F (37.3 C)  99 F  (37.2 C) 99 F (37.2 C)  TempSrc:      SpO2: 100%  97% 99%  Weight:  89.8 kg    Height:        Intake/Output Summary (Last 24 hours) at 11/26/2018 0757 Last data filed at 11/26/2018 0600 Gross per 24 hour  Intake 4466.75 ml  Output 3635 ml  Net 831.75 ml   Last 3 Weights 11/26/2018 11/22/2018 11/21/2018  Weight (lbs) 197 lb 15.6 oz 193 lb 12.6 oz 200 lb  Weight (kg) 89.8 kg 87.9 kg 90.719 kg      Telemetry    sinus- Personally Reviewed  ECG    NSR, repolarization changes- Personally Reviewed  Physical Exam   General: Well developed, well nourished, NAD  Neuro: No focal deficits  Musculoskeletal: Muscle strength 5/5 all ext  Psychiatric: Mood and affect normal  Neck: No JVD, no carotid bruits, no thyromegaly, no lymphadenopathy.  Lungs:Clear bilaterally, no wheezes, rhonci, crackles Cardiovascular: Regular rate and rhythm. No murmurs, gallops or rubs. Abdomen:Soft. Bowel sounds present. Non-tender.  Extremities: Trace bilateral lower extremity edema.   Labs    High Sensitivity Troponin:   Recent Labs  Lab 11/21/18 1933 11/22/18 0914 11/22/18 1449  TROPONINIHS 23* 4,823* 11,291*  Cardiac EnzymesNo results for input(s): TROPONINI in the last 168 hours. No results for input(s): TROPIPOC in the last 168 hours.   Chemistry Recent Labs  Lab 11/21/18 1933  11/22/18 0235  11/24/18 0310 11/25/18 0546  11/25/18 1223  11/25/18 1702 11/25/18 1809 11/25/18 1814 11/26/18 0427  NA 139   < > 141   < > 140 138   < > 144   < > 140 138  --  136  K 3.6   < > 5.0   < > 3.5 3.8   < > 3.2*   < > 4.6 4.6  --  4.2  CL 109   < > 112*   < > 105 105  --   --   --   --  106  --  105  CO2 13*  --  19*   < > 26 25  --   --   --   --   --   --  24  GLUCOSE 273*   < > 128*   < > 99 97   < > 82  --   --  114*  --  100*  BUN 23*   < > 22*   < > 12 13  --   --   --   --  10  --  7  CREATININE 1.48*   < > 1.40*   < > 1.10 1.04  --   --   --   --  0.70 0.96 0.86  CALCIUM 8.1*  --   8.5*   < > 8.7* 8.7*  --   --   --   --   --   --  7.6*  PROT 6.1*  --  5.8*  --   --   --   --   --   --   --   --   --   --   ALBUMIN 3.4*  --  3.3*  --   --   --   --   --   --   --   --   --   --   AST 50*  --  48*  --   --   --   --   --   --   --   --   --   --   ALT 49*  --  49*  --   --   --   --   --   --   --   --   --   --   ALKPHOS 58  --  50  --   --   --   --   --   --   --   --   --   --   BILITOT 0.6  --  0.7  --   --   --   --   --   --   --   --   --   --   GFRNONAA 55*  --  59*   < > >60 >60  --   --   --   --   --  >60 >60  GFRAA >60  --  >60   < > >60 >60  --   --   --   --   --  >60 >60  ANIONGAP 17*  --  10   < > 9 8  --   --   --   --   --   --  7   < > = values in this interval not displayed.     Hematology Recent Labs  Lab 11/25/18 1211  11/25/18 1809 11/25/18 1814 11/26/18 0427  WBC 10.0  --   --  14.0* 11.2*  RBC 3.39*  --   --  4.32 4.17*  HGB 9.9*   < > 12.6* 12.6* 12.0*  HCT 29.7*   < > 37.0* 38.5* 37.0*  MCV 87.6  --   --  89.1 88.7  MCH 29.2  --   --  29.2 28.8  MCHC 33.3  --   --  32.7 32.4  RDW 14.4  --   --  14.5 14.6  PLT 127*  --   --  172 182   < > = values in this interval not displayed.    BNPNo results for input(s): BNP, PROBNP in the last 168 hours.   DDimer No results for input(s): DDIMER in the last 168 hours.   Radiology    Dg Chest Port 1 View  Result Date: 11/26/2018 CLINICAL DATA:  Chest tube.  CABG. EXAM: PORTABLE CHEST 1 VIEW COMPARISON:  11/25/2018. FINDINGS: Interim extubation and removal of NG tube. Mediastinal drainage tube and left chest tube in stable position. Prior CABG. Heart size stable. Low lung volumes with progressive mild atelectatic changes both lungs. Left base infiltrate again cannot be excluded. Tiny left pleural effusion cannot be excluded. No pneumothorax. IMPRESSION: 1. Prior CABG. Interim extubation removal of NG tube. Mediastinal drainage tube and left chest tube in stable position. No pneumothorax. 2.  Low lung volumes with progressive mild atelectatic changes both lungs. Left base infiltrate again cannot be excluded. Tiny left pleural effusion cannot be excluded. Electronically Signed   By: Maisie Fushomas  Register   On: 11/26/2018 06:28   Dg Chest Port 1 View  Result Date: 11/25/2018 CLINICAL DATA:  Status post CABG EXAM: PORTABLE CHEST 1 VIEW COMPARISON:  November 22, 2018 FINDINGS: Patient status post median sternotomy and CABG. The heart size is normal. Endotracheal tube is identified with distal tip 5.2 cm from carina. Swan-Ganz catheter, mediastinal drain and left chest tube, nasogastric tube are identified. There is patchy consolidation of left lung base with associated left pleural effusion. The right lung is clear. The visualized skeletal structures are stable. IMPRESSION: Postsurgical changes as described. Life supporting devices are in good position as described. Patchy consolidation of left lung base with associated small left pleural effusion. Electronically Signed   By: Sherian ReinWei-Chen  Lin M.D.   On: 11/25/2018 12:59   [place] Doppler Pre Cabg  Result Date: 11/24/2018 PREOPERATIVE VASCULAR EVALUATION  Indications:      Pre cabg. Comparison Study: no prior Performing Technologist: Blanch MediaMegan Riddle RVS  Examination Guidelines: A complete evaluation includes B-mode imaging, spectral Doppler, color Doppler, and power Doppler as needed of all accessible portions of each vessel. Bilateral testing is considered an integral part of a complete examination. Limited examinations for reoccurring indications may be performed as noted.  Right Carotid Findings: +----------+--------+--------+--------+-----------+--------+           PSV cm/sEDV cm/sStenosisDescribe   Comments +----------+--------+--------+--------+-----------+--------+ CCA Prox  86      21              homogeneous         +----------+--------+--------+--------+-----------+--------+ CCA Distal67      21              homogeneous          +----------+--------+--------+--------+-----------+--------+ ICA Prox  61      27      1-39%   homogeneous         +----------+--------+--------+--------+-----------+--------+ ICA Distal50      25                                  +----------+--------+--------+--------+-----------+--------+ ECA       111     22                                  +----------+--------+--------+--------+-----------+--------+ Portions of this table do not appear on this page. +----------+--------+-------+--------+------------+           PSV cm/sEDV cmsDescribeArm Pressure +----------+--------+-------+--------+------------+ Subclavian109                    133          +----------+--------+-------+--------+------------+ +---------+--------+--+--------+--+---------+ VertebralPSV cm/s33EDV cm/s11Antegrade +---------+--------+--+--------+--+---------+ Left Carotid Findings: +----------+--------+--------+--------+-----------+--------+           PSV cm/sEDV cm/sStenosisDescribe   Comments +----------+--------+--------+--------+-----------+--------+ CCA Prox  71      20              homogeneous         +----------+--------+--------+--------+-----------+--------+ CCA Distal87      23              homogeneous         +----------+--------+--------+--------+-----------+--------+ ICA Prox  72      22      1-39%   homogeneous         +----------+--------+--------+--------+-----------+--------+ ICA Distal41      19                                  +----------+--------+--------+--------+-----------+--------+ ECA       68      13                                  +----------+--------+--------+--------+-----------+--------+ +----------+--------+--------+--------+------------+ SubclavianPSV cm/sEDV cm/sDescribeArm Pressure +----------+--------+--------+--------+------------+           130                     126           +----------+--------+--------+--------+------------+ +---------+--------+--+--------+-+---------+ VertebralPSV cm/s22EDV cm/s8Antegrade +---------+--------+--+--------+-+---------+  ABI Findings: +--------+------------------+-----+---------+--------+ Right   Rt Pressure (mmHg)IndexWaveform Comment  +--------+------------------+-----+---------+--------+ [id number]                    triphasic         +--------+------------------+-----+---------+--------+ PTA                            triphasic         +--------+------------------+-----+---------+--------+ DP                             triphasic         +--------+------------------+-----+---------+--------+ +--------+------------------+-----+---------+-------+ Left    Lt Pressure (mmHg)IndexWaveform Comment +--------+------------------+-----+---------+-------+ [id number]                    triphasic        +--------+------------------+-----+---------+-------+ PTA  triphasic        +--------+------------------+-----+---------+-------+ DP                             triphasic        +--------+------------------+-----+---------+-------+  Right Doppler Findings: +--------+--------+-----+---------+--------+ Site    PressureIndexDoppler  Comments +--------+--------+-----+---------+--------+ [id number]          triphasic         +--------+--------+-----+---------+--------+ Radial               triphasic         +--------+--------+-----+---------+--------+ Ulnar                triphasic         +--------+--------+-----+---------+--------+  Left Doppler Findings: +--------+--------+-----+---------+--------+ Site    PressureIndexDoppler  Comments +--------+--------+-----+---------+--------+ [id number]          triphasic         +--------+--------+-----+---------+--------+ Radial               triphasic          +--------+--------+-----+---------+--------+ Ulnar                triphasic         +--------+--------+-----+---------+--------+  Summary: Right Carotid: Velocities in the right ICA are consistent with a 1-39% stenosis. Left Carotid: Velocities in the left ICA are consistent with a 1-39% stenosis. Vertebrals: Bilateral vertebral arteries demonstrate antegrade flow. Right Upper Extremity: Doppler waveforms remain within normal limits with right radial compression. Doppler waveforms remain within normal limits with right ulnar compression. Left Upper Extremity: Doppler waveforms remain within normal limits with left radial compression. Doppler waveforms remain within normal limits with left ulnar compression.  Electronically signed by Gretta Beganodd Early MD on 11/24/2018 at 3:18:28 PM.    Final     Cardiac Studies   Cardiac cath 11/21/18:  Mid RCA lesion is 40% stenosed.  Prox Cx lesion is 99% stenosed.  1st Mrg lesion is 99% stenosed.  2nd Diag lesion is 90% stenosed.  Ost LM to Dist LM lesion is 50% stenosed.  Prox RCA lesion is 50% stenosed.  There is mild left ventricular systolic dysfunction.  LV end diastolic pressure is normal.  The left ventricular ejection fraction is 45-50% by visual estimate.  There is no mitral valve regurgitation.  Mid LAD lesion is 95% stenosed.   1. Moderate distal left main stenosis 2. Severe mid LAD stenosis 3. Severe mid Circumflex stenosis involving the ostium of a very large obtuse marginal branch 4. Moderate mid RCA stenosis 5. Mild segmental LV systolic dysfunction (LVEF=45-50%).  6. Post cardiac arrest (ventricular fibrillation) 7. Rapid atrial fibrillation  Echo 11/22/18:  1. The left ventricle has normal systolic function with an ejection fraction of 60-65%. The cavity size was normal. Left ventricular diastolic parameters were normal.  2. The right ventricle has normal systolic function. The cavity was normal. There is no increase FINDINGS   Left Ventricle: The left ventricle has normal systolic function, with an ejection fraction of 60-65%. The cavity size was normal. There is no increase in left ventricular wall thickness. Left ventricular diastolic parameters were normal.  Right Ventricle: The right ventricle has normal systolic function. The cavity was normal. There is no increase in right ventricular wall thickness.  Left Atrium: Left atrial size was normal in size.  Right Atrium: Right atrial size was normal in size. Right atrial pressure is estimated at 8 mmHg.  Interatrial Septum: No atrial  level shunt detected by color flow Doppler.  Pericardium: There is no evidence of pericardial effusion.  Mitral Valve: The mitral valve is normal in structure. Mitral valve regurgitation is trivial by color flow Doppler.  Tricuspid Valve: The tricuspid valve is normal in structure. Tricuspid valve regurgitation is trivial by color flow Doppler.  Aortic Valve: The aortic valve is tricuspid Aortic valve regurgitation was not visualized by color flow Doppler.  Pulmonic Valve: The pulmonic valve was grossly normal. Pulmonic valve regurgitation is not visualized by color flow Doppler.  Aorta: The aorta is normal in size and structure.   Patient Profile     48 y.o. male with h/o depression/anxiety admitted following cardiac arrest at home. CPR by girlfriend for 25 minutes prior to EMS arrival. Rapid atrial fib on EMS arrival then ventricular fibrillation requiring defibrillation. 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 No thrombotic lesions on cath. No coronary intervention performed. He underwent 3V CABG 11/25/18.   Assessment & Plan    1. CAD/NSTEMI/Cardiac arrest: Pt admitted following cardiac arrest on 11/21/18. He was in atrial fib with RVR and had diffuse ischemic changes on his EKG. Cardiac cath 11/21/18 with severe disease in the Circumflex/large OM, mid LAD and moderate distal left main  disease. No coronary intervention performed. Echo with normal LV systolic function, normal RV function.  He is now s/p 3V CABG on 11/25/18. BP stable on no pressors.  -Continue ASA, statin and beta blocker. Consider d/c on Plavix given presentation with ACS.    2. Atrial fibrillation, paroxysmal: sinus today  We will be available this weekend if needed.   For questions or updates, please contact [facility] Please consult [email or web address] for contact info under   Signed, Verne Carrowhristopher Harris Penton, MD  11/26/2018, 7:57 AM

## 2018-11-26 NOTE — Telephone Encounter (Signed)
New message   Per Eual Fines visit with Lyda Jester on 12/14/2018 at 9:15am.

## 2018-11-26 NOTE — Discharge Instructions (Signed)
Coronary Artery Bypass Grafting, Care After °This sheet gives you information about how to care for yourself after your procedure. Your doctor may also give you more specific instructions. If you have problems or questions, call your doctor. °What can I expect after the procedure? °After the procedure, it is common to: °· Feel sick to your stomach (nauseous). °· Not want to eat as much as normal (lack of appetite). °· Have trouble pooping (constipation). °· Have weakness and tiredness (fatigue). °· Feel sad (depressed) or grouchy (irritable). °· Have pain or discomfort around the cuts from surgery (incisions). °Follow these instructions at home: °Medicines °· Take over-the-counter and prescription medicines only as told by your doctor. Do not stop taking medicines or start any new medicines unless your doctor says it is okay. °· If you were prescribed an antibiotic medicine, take it as told by your doctor. Do not stop taking the antibiotic even if you start to feel better. °Incision care ° °· Follow instructions from your doctor about how to take care of your cuts from surgery. Make sure you: °? Wash your hands with soap and water before and after you change your bandage (dressing). If you cannot use soap and water, use hand sanitizer. °? Change your bandage as told by your doctor. °? Leave stitches (sutures), skin glue, or skin tape (adhesive) strips in place. They may need to stay in place for 2 weeks or longer. If tape strips get loose and curl up, you may trim the loose edges. Do not remove tape strips completely unless your doctor says it is okay. °· Make sure the surgery cuts are clean, dry, and protected. °· Check your cut areas every day for signs of infection. Check for: °? More redness, swelling, or pain. °? More fluid or blood. °? Warmth. °? Pus or a bad smell. °· If cuts were made in your legs: °? Avoid crossing your legs. °? Avoid sitting for long periods of time. Change positions every 30  minutes. °? Raise (elevate) your legs when you are sitting. °Bathing °· Do not take baths, swim, or use a hot tub until your doctor says it is okay. °You may shower °Endoscopic Saphenous Vein Harvesting, Care After °This sheet gives you information about how to care for yourself after your procedure. Your health care provider may also give you more specific instructions. If you have problems or questions, contact your health care provider. °What can I expect after the procedure? °After the procedure, it is common to have: °Pain. °Bruising. °Swelling. °Numbness. °Follow these instructions at home: °Incision care ° °Follow instructions from your health care provider about how to take care of your incisions. Make sure you: °Wash your hands with soap and water before and after you change your bandages (dressings). If soap and water are not available, use hand sanitizer. °Change your dressings as told by your health care provider. °Leave stitches (sutures), skin glue, or adhesive strips in place. These skin closures may need to stay in place for 2 weeks or longer. If adhesive strip edges start to loosen and curl up, you may trim the loose edges. Do not remove adhesive strips completely unless your health care provider tells you to do that. °Check your incision areas every day for signs of infection. Check for: °More redness, swelling, or pain. °Fluid or blood. °Warmth. °Pus or a bad smell. °Medicines °Take over-the-counter and prescription medicines only as told by your health care provider. °Ask your health care provider if the medicine prescribed   to you requires you to avoid driving or using heavy machinery. °General instructions °Raise (elevate) your legs above the level of your heart while you are sitting or lying down. °Avoid crossing your legs. °Avoid sitting for long periods of time. Change positions every 30 minutes. °Do any exercises your health care providers have given you. These may include deep breathing,  coughing, and walking exercises. °Do not take baths, swim, or use a hot tub until your health care provider approves. Ask your health care provider if you may take showers. You may only be allowed to take sponge baths. °Wear compression stockings as told by your health care provider. These stockings help to prevent blood clots and reduce swelling in your legs. °Keep all follow-up visits as told by your health care provider. This is important. °Contact a health care provider if: °Medicine does not help your pain. °Your pain gets worse. °You have new leg bruises or your leg bruises get bigger. °Your leg feels numb. °You have more redness, swelling, or pain around your incision. °You have fluid or blood coming from your incision. °Your incision feels warm to the touch. °You have pus or a bad smell coming from your incision. °You have a fever. °Get help right away if: °Your pain is severe. °You develop pain, tenderness, warmth, redness, or swelling in any part of your leg. °You have chest pain. °You have trouble breathing. °Summary °Raise (elevate) your legs above the level of your heart while you are sitting or lying down. °Wear compression stockings as told by your health care provider. °Make sure you know which symptoms should prompt you to contact your health care provider. °Keep all follow-up visits as told by your health care provider. °This information is not intended to replace advice given to you by your health care provider. Make sure you discuss any questions you have with your health care provider. °Document Released: 12/25/2010 Document Revised: 03/22/2018 Document Reviewed: 03/22/2018 °Elsevier Patient Education © 2020 [employer] °· . Pat the surgery cuts dry. Do not rub the cuts to dry °Eating and drinking ° °· Eat foods that are high in fiber, such as beans, nuts, whole grains, and raw fruits and vegetables. Any meats you eat should be lean cut. Avoid canned, processed, and fried foods. This can help  prevent trouble pooping. This is also a part of a heart-healthy diet. °· Drink enough fluid to keep your pee (urine) pale yellow. °· Do not drink alcohol until you are fully recovered. Ask your doctor when it is safe to drink alcohol. °Activity °· Rest and limit your activity as told by your doctor. You may be told to: °? Stop any activity right away if you have chest pain, shortness of breath, irregular heartbeats, or dizziness. Get help right away if you have any of these symptoms. °? Move around often for short periods or take short walks as told by your doctor. Slowly increase your activities. °? Avoid lifting, pushing, or pulling anything that is heavier than 10 lb (4.5 kg) for at least 6 weeks or as told by your doctor. °· Do physical therapy or a cardiac rehab (cardiac rehabilitation) program as told by your doctor. °? Physical therapy involves doing exercises to maintain movement and build strength and endurance. °? A cardiac rehab program includes: °§ Exercise training. °§ Education. °§ Counseling. °· Do not drive until your doctor says it is okay. °· Ask your doctor when you can go back to work. °· Ask your doctor when   you can be sexually active. °General instructions °· Do not drive or use heavy machinery while taking prescription pain medicine. °· Do not use any products that contain nicotine or tobacco. These include cigarettes, e-cigarettes, and chewing tobacco. If you need help quitting, ask your doctor. °· Take 2-3 deep breaths every few hours during the day while you get better. This helps expand your lungs and prevent problems. °· If you were given a device called an incentive spirometer, use it several times a day to practice deep breathing. Support your chest with a pillow or your arms when you take deep breaths or cough. °· Wear compression stockings as told by your doctor. °· Weigh yourself every day. This helps to see if your body is holding (retaining) fluid that may make your heart and lungs  work harder. °· Keep all follow-up visits as told by your doctor. This is important. °Contact a doctor if: °· You have more redness, swelling, or pain around any cut. °· You have more fluid or blood coming from any cut. °· Any cut feels warm to the touch. °· You have pus or a bad smell coming from any cut. °· You have a fever. °· You have swelling in your ankles or legs. °· You have pain in your legs. °· You gain 2 lb (0.9 kg) or more a day. °· You feel sick to your stomach or you throw up (vomit). °· You have watery poop (diarrhea). °Get help right away if: °· You have chest pain that goes to your jaw or arms. °· You are short of breath. °· You have a fast or irregular heartbeat. °· You notice a "clicking" in your breastbone (sternum) when you move. °· You have any signs of a stroke. "BE FAST" is an easy way to remember the main warning signs: °? B - Balance. Signs are dizziness, sudden trouble walking, or loss of balance. °? E - Eyes. Signs are trouble seeing or a change in how you see. °? F - Face. Signs are sudden weakness or loss of feeling of the face, or the face or eyelid drooping on one side. °? A - Arms. Signs are weakness or loss of feeling in an arm. This happens suddenly and usually on one side of the body. °? S - Speech. Signs are sudden trouble speaking, slurred speech, or trouble understanding what people say. °? T - Time. Time to call emergency services. Write down what time symptoms started. °· You have other signs of a stroke, such as: °? A sudden, very bad headache with no known cause. °? Feeling sick to your stomach. °? Throwing up. °? Jerky movements you cannot control (seizure). °These symptoms may be an emergency. Do not wait to see if the symptoms will go away. Get medical help right away. Call your local emergency services (911 in the [place] Do not drive yourself to the hospital. °Summary °· After the procedure, it is common to have pain or discomfort in the cuts from surgery  (incisions). °· Do not take baths, swim, or use a hot tub until your doctor says it is okay. °· Slowly increase your activities. You may need physical therapy or cardiac rehab. °· Weigh yourself every day. This helps to see if your body is holding fluid. °This information is not intended to replace advice given to you by your health care provider. Make sure you discuss any questions you have with your health care provider. °Document Released: 04/19/2013 Document Revised: 12/22/2017 Document Reviewed:   12/22/2017 °Elsevier Patient Education © 2020 [employer] ° °

## 2018-11-26 NOTE — Plan of Care (Signed)
  Problem: Education: Goal: Understanding of CV disease, CV risk reduction, and recovery process will improve Outcome: Not Progressing   Problem: Activity: Goal: Ability to return to baseline activity level will improve Outcome: Progressing   Problem: Cardiovascular: Goal: Ability to achieve and maintain adequate cardiovascular perfusion will improve Outcome: Progressing Goal: Vascular access site(s) Level 0-1 will be maintained Outcome: Completed/Met

## 2018-11-27 ENCOUNTER — Inpatient Hospital Stay (HOSPITAL_COMMUNITY): Payer: Medicaid Other

## 2018-11-27 LAB — TYPE AND SCREEN
ABO/RH(D): O POS
Antibody Screen: NEGATIVE
Unit division: 0
Unit division: 0

## 2018-11-27 LAB — BASIC METABOLIC PANEL
Anion gap: 8 (ref 5–15)
BUN: 10 mg/dL (ref 6–20)
CO2: 28 mmol/L (ref 22–32)
Calcium: 8.3 mg/dL — ABNORMAL LOW (ref 8.9–10.3)
Chloride: 100 mmol/L (ref 98–111)
Creatinine, Ser: 1.06 mg/dL (ref 0.61–1.24)
GFR calc Af Amer: >60 mL/min (ref >60)
GFR calc non Af Amer: >60 mL/min (ref >60)
Glucose, Bld: 102 mg/dL — ABNORMAL HIGH (ref 70–99)
Potassium: 3.9 mmol/L (ref 3.5–5.1)
Sodium: 136 mmol/L (ref 135–145)

## 2018-11-27 LAB — BPAM RBC
Blood Product Expiration Date: 202008242359
Blood Product Expiration Date: 202008242359
Unit Type and Rh: 5100
Unit Type and Rh: 5100

## 2018-11-27 LAB — CBC
HCT: 38.3 % — ABNORMAL LOW (ref 39.0–52.0)
Hemoglobin: 12.3 g/dL — ABNORMAL LOW (ref 13.0–17.0)
MCH: 28.8 pg (ref 26.0–34.0)
MCHC: 32.1 g/dL (ref 30.0–36.0)
MCV: 89.7 fL (ref 80.0–100.0)
Platelets: 214 10*3/uL (ref 150–400)
RBC: 4.27 MIL/uL (ref 4.22–5.81)
RDW: 14.7 % (ref 11.5–15.5)
WBC: 12.3 10*3/uL — ABNORMAL HIGH (ref 4.0–10.5)
nRBC: 0 % (ref 0.0–0.2)

## 2018-11-27 LAB — GLUCOSE, CAPILLARY
Glucose-Capillary: 106 mg/dL — ABNORMAL HIGH (ref 70–99)
Glucose-Capillary: 111 mg/dL — ABNORMAL HIGH (ref 70–99)
Glucose-Capillary: 113 mg/dL — ABNORMAL HIGH (ref 70–99)
Glucose-Capillary: 114 mg/dL — ABNORMAL HIGH (ref 70–99)
Glucose-Capillary: 128 mg/dL — ABNORMAL HIGH (ref 70–99)
Glucose-Capillary: 97 mg/dL (ref 70–99)

## 2018-11-27 MED ORDER — POTASSIUM CHLORIDE CRYS ER 20 MEQ PO TBCR
20.0000 meq | EXTENDED_RELEASE_TABLET | Freq: Every day | ORAL | Status: DC
  Administered 2018-11-28 – 2018-11-29 (×2): 20 meq via ORAL
  Filled 2018-11-27 (×2): qty 1

## 2018-11-27 MED ORDER — INSULIN ASPART 100 UNIT/ML ~~LOC~~ SOLN: 0.0000 [IU] | SUBCUTANEOUS | Status: DC

## 2018-11-27 MED ORDER — POTASSIUM CHLORIDE CRYS ER 20 MEQ PO TBCR
30.0000 meq | EXTENDED_RELEASE_TABLET | Freq: Once | ORAL | Status: AC
  Administered 2018-11-27: 10:00:00 30 meq via ORAL
  Filled 2018-11-27: qty 1

## 2018-11-27 NOTE — Progress Notes (Signed)
Removed patient's chest tubes today.  Suture placement was positioned for anchoring and did not allow for closure of incisions.  Roxy Manns MD was consulted and recommended Curad petrolatum dressing be used to seal chest tubes incisions.  Chest tubes were removed and Curad dressing followed by gauze and Medipore dressing were placed successfully.  Chest tube removal unremarkable, no complications to report at this time.

## 2018-11-27 NOTE — Progress Notes (Signed)
CARDIAC REHAB PHASE I   PRE:  Rate/Rhythm: 99 SR  BP:  Supine:   Sitting: 143/80  Standing:    SaO2: 96 RA  MODE:  Ambulation: 160 ft   POST:  Rate/Rhythm: 95  BP:  Supine:   Sitting: 105/65  Standing:    SaO2: 89-92 RA 1020-1115 Assisted X 1 and used walker to ambulate first to the bathroom and then to hall. Gait very slow but steady. Pt able to walk 160 feet with several standing rest stops. He c/o of not being able to breath with mask on. RA saturation after walk 89-92%. Pt to recliner after walk with call light in reach.   Rodney Langton RN 11/27/2018 11:14 AM

## 2018-11-27 NOTE — Progress Notes (Addendum)
      [address]       [place] [address]             [phone]        2 Days Post-Op Procedure(s) (LRB): CORONARY ARTERY BYPASS GRAFTING (CABG) times three, using left mammary artery and right greater saphenous vein harvested endoscopically (N/A) CLIPPING OF ATRIAL APPENDAGE (N/A) TRANSESOPHAGEAL ECHOCARDIOGRAM (TEE) (N/A)  Subjective: Patient hungry and states he has mostly had water. He denies nausea or emesis. He hast sternal, incisional pain.  Objective: Vital signs in last 24 hours: Temp:  [97.6 F (36.4 C)-99.3 F (37.4 C)] 99.1 F (37.3 C) (08/01 0818) Pulse Rate:  [79-144] 90 (08/01 0818) Cardiac Rhythm: Normal sinus rhythm (07/31 2044) Resp:  [14-25] 19 (08/01 0818) BP: (115-147)/(69-85) 147/81 (08/01 0818) SpO2:  [89 %-100 %] 100 % (08/01 0818) Weight:  [88.5 kg] 88.5 kg (08/01 0500)  Pre op weight 87.9 kg Current Weight  11/27/18 88.5 kg    Hemodynamic parameters for last 24 hours: PAP: (35)/(14) 35/14 CVP:  [15 mmHg] 15 mmHg  Intake/Output from previous day: 07/31 0701 - 08/01 0700 In: 425.2 [P.O.:240; I.V.:85.2; IV Piggyback:100] Out: 1070 [Urine:980; Chest Tube:90]   Physical Exam:  Cardiovascular: RRR Pulmonary: Diminished bibasilar breath sounds Abdomen: Soft, non tender, bowel sounds present. Extremities: Mild bilateral lower extremity edema. Wounds: Sternal wound is clean and dry.  No erythema or signs of infection. Chest tubes: to suction, no air leak  Lab Results: CBC: Recent Labs    11/26/18 1626 11/26/18 1633 11/27/18 0709  WBC 13.4*  --  12.3*  HGB 12.0* 12.2* 12.3*  HCT 36.9* 36.0* 38.3*  PLT 185  --  214   BMET:  Recent Labs    11/26/18 1626 11/26/18 1633 11/27/18 0709  NA 136 138 136  K 4.2 4.2 3.9  CL 102 98 100  CO2 25  --  28  GLUCOSE 109* 105* 102*  BUN 10 11 10   CREATININE 0.86 0.70 1.06  CALCIUM 8.1*  --  8.3*    PT/INR:  Lab Results  Component Value Date   INR 1.4 (H) 11/25/2018    INR 1.0 11/25/2018   INR 1.3 (H) 11/21/2018   ABG:  INR: Will add last result for INR, ABG once components are confirmed Will add last 4 CBG results once components are confirmed  Assessment/Plan:  1. CV - SR in the 90's and hypertensive this am. On Lopressor 12.5 mg bid. Will consider ACE/ARB in am if BP will allow. 2.  Pulmonary - On 2-3 liters of oxygen via [place]. Wean as able. CT with 90 cc of output.  CXR this am shows low lung volumes, small left pleural effusion, no pneumothorax . Remove chest tubes. Encourage incentive spirometer and flutter valve 3. Volume Overload - On Lasix 40 mg daily 4.  Expected acute blood loss anemia - H and H this am stable 12.3 and 38.3 5. Supplement potassium 6. CBGs 113/128/111. Pre op HGA1C 5.9. He likely has pre diabetes.  Will stop accu checks and SS in am. Will provide nutritional information at discharge and will need follow up with medical doctor after discharge 7. Remove EPW in am  Donielle M ZimmermanPA-C 11/27/2018,8:19 AM [phone]  I have seen and examined the patient and agree with the assessment and plan as outlined.  Making steady progress.  Rexene Alberts, MD 11/27/2018 11:45 AM

## 2018-11-28 ENCOUNTER — Inpatient Hospital Stay (HOSPITAL_COMMUNITY): Payer: Medicaid Other

## 2018-11-28 LAB — GLUCOSE, CAPILLARY
Glucose-Capillary: 120 mg/dL — ABNORMAL HIGH (ref 70–99)
Glucose-Capillary: 74 mg/dL (ref 70–99)

## 2018-11-28 MED ORDER — METOPROLOL TARTRATE 25 MG/10 ML ORAL SUSPENSION
25.0000 mg | Freq: Two times a day (BID) | ORAL | Status: DC
  Filled 2018-11-28 (×4): qty 10

## 2018-11-28 MED ORDER — METOPROLOL TARTRATE 25 MG PO TABS
25.0000 mg | ORAL_TABLET | Freq: Two times a day (BID) | ORAL | Status: DC
  Administered 2018-11-28 – 2018-11-29 (×3): 25 mg via ORAL
  Filled 2018-11-28 (×3): qty 1

## 2018-11-28 NOTE — Progress Notes (Addendum)
      [address]       [place] [address]             [phone]        3 Days Post-Op Procedure(s) (LRB): CORONARY ARTERY BYPASS GRAFTING (CABG) times three, using left mammary artery and right greater saphenous vein harvested endoscopically (N/A) CLIPPING OF ATRIAL APPENDAGE (N/A) TRANSESOPHAGEAL ECHOCARDIOGRAM (TEE) (N/A)  Subjective: Patient sitting in chair, eating breakfast. He states his breathing is better.  Objective: Vital signs in last 24 hours: Temp:  [98.3 F (36.8 C)-99 F (37.2 C)] 98.4 F (36.9 C) (08/02 0400) Pulse Rate:  [92-110] 96 (08/02 0400) Cardiac Rhythm: Normal sinus rhythm;Sinus tachycardia (08/01 2030) Resp:  [14-27] 20 (08/02 0400) BP: (114-143)/(57-80) 132/73 (08/02 0400) SpO2:  [87 %-96 %] 93 % (08/02 0400) Weight:  [87 kg] 87 kg (08/02 0400)  Pre op weight 87.9 kg Current Weight  11/28/18 87 kg      Intake/Output from previous day: 08/01 0701 - 08/02 0700 In: 600 [P.O.:600] Out: 1941 [Urine:1850; Stool:1; Chest Tube:90]   Physical Exam:  Cardiovascular: RRR Pulmonary: Slightly diminished bibasilar breath sounds Abdomen: Soft, non tender, bowel sounds present. Extremities: Trace bilateral lower extremity edema. Wounds: Sternal wound is clean and dry.  No erythema or signs of infection.   Lab Results: CBC: Recent Labs    11/26/18 1626 11/26/18 1633 11/27/18 0709  WBC 13.4*  --  12.3*  HGB 12.0* 12.2* 12.3*  HCT 36.9* 36.0* 38.3*  PLT 185  --  214   BMET:  Recent Labs    11/26/18 1626 11/26/18 1633 11/27/18 0709  NA 136 138 136  K 4.2 4.2 3.9  CL 102 98 100  CO2 25  --  28  GLUCOSE 109* 105* 102*  BUN 10 11 10   CREATININE 0.86 0.70 1.06  CALCIUM 8.1*  --  8.3*    PT/INR:  Lab Results  Component Value Date   INR 1.4 (H) 11/25/2018   INR 1.0 11/25/2018   INR 1.3 (H) 11/21/2018   ABG:  INR: Will add last result for INR, ABG once components are confirmed Will add last 4 CBG results  once components are confirmed  Assessment/Plan:  1. CV - Slightly tachy in the low 100's and hypertensive this am. On Lopressor 12.5 mg bid. Will increase Lopressor and start Lisinopril for bette BP control. 2.  Pulmonary - On room air this am. CXR this am appears to show small left pleural effusions, no pneumothorax .Encourage incentive spirometer and flutter valve 3. Volume Overload - On Lasix 40 mg daily 4.  Expected acute blood loss anemia - H and H yesterday stable 12.3 and 38.3 5. Remove EPW  6. CBGs 114/97/106. Pre op HGA1C 5.9. He likely has pre diabetes.  Will stop accu checks and SS. Will provide nutritional information at discharge and will need follow up with medical doctor after discharge 7. Possible discharge 1-2 days  Donielle M ZimmermanPA-C 11/28/2018,8:34 AM [phone]   I have seen and examined the patient and agree with the assessment and plan as outlined.  Doing well.  Possibly ready for hospital D/C in 1-2 days  Rexene Alberts, MD 11/28/2018 10:34 AM

## 2018-11-29 ENCOUNTER — Other Ambulatory Visit: Payer: Self-pay | Admitting: Physician Assistant

## 2018-11-29 ENCOUNTER — Telehealth: Payer: Self-pay | Admitting: *Deleted

## 2018-11-29 DIAGNOSIS — E7800 Pure hypercholesterolemia, unspecified: Secondary | ICD-10-CM

## 2018-11-29 DIAGNOSIS — I469 Cardiac arrest, cause unspecified: Secondary | ICD-10-CM

## 2018-11-29 DIAGNOSIS — I480 Paroxysmal atrial fibrillation: Secondary | ICD-10-CM

## 2018-11-29 DIAGNOSIS — I2583 Coronary atherosclerosis due to lipid rich plaque: Secondary | ICD-10-CM

## 2018-11-29 MED ORDER — OXYCODONE HCL 5 MG PO TABS: 5.0000 mg | ORAL_TABLET | ORAL | 0 refills | Status: DC | PRN

## 2018-11-29 MED ORDER — ASPIRIN EC 81 MG PO TBEC: 81.0000 mg | DELAYED_RELEASE_TABLET | Freq: Every day | ORAL | Status: AC

## 2018-11-29 MED ORDER — FUROSEMIDE 40 MG PO TABS: 40.0000 mg | ORAL_TABLET | Freq: Every day | ORAL | 0 refills | Status: DC

## 2018-11-29 MED ORDER — POTASSIUM CHLORIDE CRYS ER 20 MEQ PO TBCR: 20.0000 meq | EXTENDED_RELEASE_TABLET | Freq: Every day | ORAL | 0 refills | Status: DC

## 2018-11-29 MED ORDER — CLOPIDOGREL BISULFATE 75 MG PO TABS: 75.0000 mg | ORAL_TABLET | Freq: Every day | ORAL | 1 refills | Status: DC

## 2018-11-29 MED ORDER — METOPROLOL TARTRATE 25 MG PO TABS: 25.0000 mg | ORAL_TABLET | Freq: Two times a day (BID) | ORAL | 1 refills | Status: DC

## 2018-11-29 MED ORDER — ASPIRIN 325 MG PO TBEC: 325.0000 mg | DELAYED_RELEASE_TABLET | Freq: Every day | ORAL | 0 refills | Status: DC

## 2018-11-29 MED ORDER — ATORVASTATIN CALCIUM 80 MG PO TABS: 80.0000 mg | ORAL_TABLET | Freq: Every day | ORAL | 1 refills | Status: DC

## 2018-11-29 NOTE — Progress Notes (Signed)
[id number] Pt has been walking without walker. Education completed with pt and significant other who voiced understanding. Reviewed staying in the tube and handout given. Discussed walking for ex, heart healthy food choices, wound care, IS, and CRP 2. Referred to [place] CRP 2. Pt did not wish to watch discharge video.  Wants to go home. Graylon Good RN BSN 11/29/2018 11:20 AM

## 2018-11-29 NOTE — Telephone Encounter (Signed)
FYI to [facility] team - appt date changed to 8/12 to ensure appt is within 7-10 days. Pt is going home today and will need TOC call.

## 2018-11-29 NOTE — Progress Notes (Addendum)
      [address]       [place] [address]             [phone]        4 Days Post-Op Procedure(s) (LRB): CORONARY ARTERY BYPASS GRAFTING (CABG) times three, using left mammary artery and right greater saphenous vein harvested endoscopically (N/A) CLIPPING OF ATRIAL APPENDAGE (N/A) TRANSESOPHAGEAL ECHOCARDIOGRAM (TEE) (N/A)  Subjective: Patient without complaints this am. He hopes to go home.   Objective: Vital signs in last 24 hours: Temp:  [98.3 F (36.8 C)-98.6 F (37 C)] 98.3 F (36.8 C) (08/03 0641) Pulse Rate:  [76-99] 87 (08/03 0641) Cardiac Rhythm: Normal sinus rhythm (08/02 1901) Resp:  [16-24] 17 (08/03 0641) BP: (114-128)/(71-81) 128/81 (08/03 0641) SpO2:  [94 %-99 %] 94 % (08/03 0641) Weight:  [84.8 kg] 84.8 kg (08/03 0641)  Pre op weight 87.9 kg Current Weight  11/29/18 84.8 kg      Intake/Output from previous day: 08/02 0701 - 08/03 0700 In: 1440 [P.O.:1440] Out: 2153 [Urine:2150; Stool:3]   Physical Exam:  Cardiovascular: RRR Pulmonary: Slightly diminished bibasilar breath sounds Abdomen: Soft, non tender, bowel sounds present. Extremities: Trace bilateral lower extremity edema. Wounds: Sternal wound is clean and dry.  No erythema or signs of infection. Small eschar forming under distal sternal wound (likely from defib)   Lab Results: CBC: Recent Labs    11/26/18 1626 11/26/18 1633 11/27/18 0709  WBC 13.4*  --  12.3*  HGB 12.0* 12.2* 12.3*  HCT 36.9* 36.0* 38.3*  PLT 185  --  214   BMET:  Recent Labs    11/26/18 1626 11/26/18 1633 11/27/18 0709  NA 136 138 136  K 4.2 4.2 3.9  CL 102 98 100  CO2 25  --  28  GLUCOSE 109* 105* 102*  BUN 10 11 10   CREATININE 0.86 0.70 1.06  CALCIUM 8.1*  --  8.3*    PT/INR:  Lab Results  Component Value Date   INR 1.4 (H) 11/25/2018   INR 1.0 11/25/2018   INR 1.3 (H) 11/21/2018   ABG:  INR: Will add last result for INR, ABG once components are confirmed Will add  last 4 CBG results once components are confirmed  Assessment/Plan:  1. CV - SR in the 90's.  On Lopressor 25 mg bid and Lisinopril 2.5 mg daily. Per Dr. Kipp Brood, will ask cardiology to evaluate to see whether or not patient needs life Vest (LVEF pre op 45-50%, out of hospital arrest);likely will not need. 2.  Pulmonary - On room air this am .Encourage incentive spirometer and flutter valve 3. Volume Overload - On Lasix 40 mg daily 4.  Expected acute blood loss anemia - Last H and H stable at 12.3 and 38.3 5. Possible discharge later today  Rutger Salton M ZimmermanPA-C 11/29/2018,7:23 AM [phone]

## 2018-11-29 NOTE — Progress Notes (Addendum)
Progress Note  Patient Name: George Munoz Date of Encounter: 11/29/2018  Primary Cardiologist: [facility]  Subjective   Maintaining NSR on tele. No complaints.  Ready to go home  Inpatient Medications    Scheduled Meds: . acetaminophen  1,000 mg Oral Q6H   Or  . acetaminophen (TYLENOL) oral liquid 160 mg/5 mL  1,000 mg Per Tube Q6H  . aspirin EC  325 mg Oral Daily   Or  . aspirin  324 mg Per Tube Daily  . atorvastatin  80 mg Oral q1800  . bisacodyl  10 mg Oral Daily   Or  . bisacodyl  10 mg Rectal Daily  . Chlorhexidine Gluconate Cloth  6 each Topical Daily  . docusate sodium  200 mg Oral Daily  . enoxaparin (LOVENOX) injection  40 mg Subcutaneous QHS  . FLUoxetine  10 mg Oral Daily  . furosemide  40 mg Oral Daily  . mouth rinse  15 mL Mouth Rinse BID  . metoprolol tartrate  25 mg Oral BID   Or  . metoprolol tartrate  25 mg Per Tube BID  . pantoprazole  40 mg Oral Daily  . potassium chloride  20 mEq Oral Daily  . sodium chloride flush  3 mL Intravenous Q12H   Continuous Infusions: . sodium chloride Stopped (11/26/18 0959)  . sodium chloride    . sodium chloride 10 mL/hr at 11/25/18 1217  . lactated ringers    . lactated ringers 20 mL/hr at 11/25/18 1216  . lactated ringers Stopped (11/26/18 0616)   PRN Meds: sodium chloride, lactated ringers, metoprolol tartrate, morphine injection, ondansetron (ZOFRAN) IV, oxyCODONE, sodium chloride flush, traMADol   Vital Signs    Vitals:   11/28/18 1936 11/29/18 0026 11/29/18 0641 11/29/18 0833  BP: 124/78 122/72 128/81 133/74  Pulse: 92 76 87 (!) 103  Resp: 18 18 17  (!) 23  Temp: 98.6 F (37 C) 98.3 F (36.8 C) 98.3 F (36.8 C) 99 F (37.2 C)  TempSrc: Oral Oral Oral Oral  SpO2: 99% 96% 94% 96%  Weight:   84.8 kg   Height:        Intake/Output Summary (Last 24 hours) at 11/29/2018 0917 Last data filed at 11/28/2018 1700 Gross per 24 hour  Intake 1080 ml  Output 1501 ml  Net -421 ml   Last 3 Weights  11/29/2018 11/28/2018 11/27/2018  Weight (lbs) 186 lb 14.4 oz 191 lb 12.8 oz 195 lb 3.2 oz  Weight (kg) 84.777 kg 87 kg 88.542 kg      Telemetry    sinus- Personally Reviewed  ECG    NSR, repolarization changes- Personally Reviewed  Physical Exam   GEN: Well nourished, well developed in no acute distress HEENT: Normal NECK: No JVD; No carotid bruits LYMPHATICS: No lymphadenopathy CARDIAC:RRR, no murmurs, rubs, gallops RESPIRATORY:  Clear to auscultation without rales, wheezing or rhonchi  ABDOMEN: Soft, non-tender, non-distended MUSCULOSKELETAL:  No edema; No deformity  SKIN: Warm and dry NEUROLOGIC:  Alert and oriented x 3 PSYCHIATRIC:  Normal affect    Labs    High Sensitivity Troponin:   Recent Labs  Lab 11/21/18 1933 11/22/18 0914 11/22/18 1449  TROPONINIHS 23* 4,823* 11,291*      Cardiac EnzymesNo results for input(s): TROPONINI in the last 168 hours. No results for input(s): TROPIPOC in the last 168 hours.   Chemistry Recent Labs  Lab 11/26/18 0427 11/26/18 1626 11/26/18 1633 11/27/18 0709  NA 136 136 138 136  K 4.2 4.2 4.2 3.9  CL 105 102 98 100  CO2 24 25  --  28  GLUCOSE 100* 109* 105* 102*  BUN 7 10 11 10   CREATININE 0.86 0.86 0.70 1.06  CALCIUM 7.6* 8.1*  --  8.3*  GFRNONAA >60 >60  --  >60  GFRAA >60 >60  --  >60  ANIONGAP 7 9  --  8     Hematology Recent Labs  Lab 11/26/18 0427 11/26/18 1626 11/26/18 1633 11/27/18 0709  WBC 11.2* 13.4*  --  12.3*  RBC 4.17* 4.16*  --  4.27  HGB 12.0* 12.0* 12.2* 12.3*  HCT 37.0* 36.9* 36.0* 38.3*  MCV 88.7 88.7  --  89.7  MCH 28.8 28.8  --  28.8  MCHC 32.4 32.5  --  32.1  RDW 14.6 14.6  --  14.7  PLT 182 185  --  214    BNPNo results for input(s): BNP, PROBNP in the last 168 hours.   DDimer No results for input(s): DDIMER in the last 168 hours.   Radiology    Dg Chest 2 View  Result Date: 11/28/2018 CLINICAL DATA:  48 year old male postoperative day 3 status post CABG, atrial appendage clip.  EXAM: CHEST - 2 VIEW COMPARISON:  A 120 and earlier. FINDINGS: PA and lateral views. Mediastinal and chest tubes have been removed. No epicardial pacer wires identified. Small bilateral pleural effusions. Associated basilar atelectasis, greater on the left. Stable postoperative mediastinal contour. Visualized tracheal air column is within normal limits. No pneumothorax or pulmonary edema. Stable visualized osseous structures. Negative visible bowel gas pattern. IMPRESSION: 1. Mediastinal and chest tubes removed.  No pneumothorax. 2. Small bilateral pleural effusions with atelectasis, greater on the left. Electronically Signed   By: Genevie Ann M.D.   On: 11/28/2018 09:01    Cardiac Studies   Cardiac cath 11/21/18:  Mid RCA lesion is 40% stenosed.  Prox Cx lesion is 99% stenosed.  1st Mrg lesion is 99% stenosed.  2nd Diag lesion is 90% stenosed.  Ost LM to Dist LM lesion is 50% stenosed.  Prox RCA lesion is 50% stenosed.  There is mild left ventricular systolic dysfunction.  LV end diastolic pressure is normal.  The left ventricular ejection fraction is 45-50% by visual estimate.  There is no mitral valve regurgitation.  Mid LAD lesion is 95% stenosed.   1. Moderate distal left main stenosis 2. Severe mid LAD stenosis 3. Severe mid Circumflex stenosis involving the ostium of a very large obtuse marginal branch 4. Moderate mid RCA stenosis 5. Mild segmental LV systolic dysfunction (QQPY=19-50%).  6. Post cardiac arrest (ventricular fibrillation) 7. Rapid atrial fibrillation  Echo 11/22/18:  1. The left ventricle has normal systolic function with an ejection fraction of 60-65%. The cavity size was normal. Left ventricular diastolic parameters were normal.  2. The right ventricle has normal systolic function. The cavity was normal. There is no increase FINDINGS  Left Ventricle: The left ventricle has normal systolic function, with an ejection fraction of 60-65%. The cavity size was  normal. There is no increase in left ventricular wall thickness. Left ventricular diastolic parameters were normal.  Right Ventricle: The right ventricle has normal systolic function. The cavity was normal. There is no increase in right ventricular wall thickness.  Left Atrium: Left atrial size was normal in size.  Right Atrium: Right atrial size was normal in size. Right atrial pressure is estimated at 8 mmHg.  Interatrial Septum: No atrial level shunt detected by color flow Doppler.  Pericardium: There  is no evidence of pericardial effusion.  Mitral Valve: The mitral valve is normal in structure. Mitral valve regurgitation is trivial by color flow Doppler.  Tricuspid Valve: The tricuspid valve is normal in structure. Tricuspid valve regurgitation is trivial by color flow Doppler.  Aortic Valve: The aortic valve is tricuspid Aortic valve regurgitation was not visualized by color flow Doppler.  Pulmonic Valve: The pulmonic valve was grossly normal. Pulmonic valve regurgitation is not visualized by color flow Doppler.  Aorta: The aorta is normal in size and structure.   Patient Profile     48 y.o. male with h/o depression/anxiety admitted following cardiac arrest at home. CPR by girlfriend for 25 minutes prior to EMS arrival. Rapid atrial fib on EMS arrival then ventricular fibrillation requiring defibrillation. 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 No thrombotic lesions on cath. No coronary intervention performed. He underwent 3V CABG 11/25/18.   Assessment & Plan    1. Cardiac arrest: -admitted following cardiac arrest on 11/21/18.  -In atrial fib with RVR and had diffuse ischemic changes on his EKG.  -Cardiac cath 11/21/18 with severe disease in the Circumflex/large OM, mid LAD and moderate distal left main disease.  -cardiac arrest was in the setting of afib with RVR/severe CAD and now revascularized.  EF at cath  with mildly reduced LVF  45-50% and on echo 60-65% .  Since EF > 35% no indication for LifeVest at this time.  2.  ASCAD -admitted following cardiac arrest on 11/21/18.  -In atrial fib with RVR and had diffuse ischemic changes on his EKG.  -Cardiac cath 11/21/18 with severe disease in the Circumflex/large OM, mid LAD and moderate distal left main disease.  -Echo with normal LV systolic function, normal RV function.  - s/p 3V CABG on 11/25/18. -he is hemodynamically stable with no arrhythmias on tele -Continue ASA, statin and beta blocker.  -Consider d/c on Plavix given presentation with ACS.    3. Atrial fibrillation, paroxysmal -?afib with RVR on EMS arrival after cardiac arrest -no prior hx of PAF -maintaining NSR on exam -recommend outpt event monitor to assess for silent afib  [facility] will sign off.   Medication Recommendations:  ASA 325mg  daily, Lipitor 80mg  daily, Lopressor 25mg  BID.  Consider addition of Plavix 75mg  daily.  Other recommendations (labs, testing, etc):  FLP and ALT in 6 weeks, event monitor Follow up as an outpatient:  7-10 day TOC appt   For questions or updates, please contact [facility] Please consult [email or web address] for contact info under   Signed, Armanda Magicraci Turner, MD  11/29/2018, 9:17 AM

## 2018-11-29 NOTE — Progress Notes (Signed)
   Asked to assist with f/u planning. Dr. Radford Pax recommended 7-10 day TOC f/u. There was an appt scheduled for later this month but too far out. There was no availability on Dr. Camillia Herter care team so I rescheduled to [place] on 12/08/18. If the patient is tolerating statin at time of follow-up appointment, would recommend setting up f/u lipid labs as outlined in Dr. Theodosia Blender note. I also sent message to our monitor team to order and arrange event monitor for patient to receive after discharge (has to be pre-certed with insurance, will be mailed to patient).  Dayna Dunn PA-C

## 2018-11-29 NOTE — Telephone Encounter (Signed)
[employer] to ship a 30 day cardiac event monitor to his home.  Instructions reviewed briefly as they are included in the monitor kit. 

## 2018-11-30 MED FILL — Sodium Chloride IV Soln 0.9%: INTRAVENOUS | Qty: 3000 | Status: AC

## 2018-11-30 MED FILL — Calcium Chloride Inj 10%: INTRAVENOUS | Qty: 10 | Status: AC

## 2018-11-30 MED FILL — Mannitol IV Soln 20%: INTRAVENOUS | Qty: 500 | Status: AC

## 2018-11-30 MED FILL — Heparin Sodium (Porcine) Inj 1000 Unit/ML: INTRAMUSCULAR | Qty: 10 | Status: AC

## 2018-11-30 MED FILL — Lidocaine HCl Local Preservative Free (PF) Inj 2%: INTRAMUSCULAR | Qty: 15 | Status: AC

## 2018-11-30 MED FILL — Electrolyte-R (PH 7.4) Solution: INTRAVENOUS | Qty: 3000 | Status: AC

## 2018-11-30 MED FILL — Sodium Bicarbonate IV Soln 8.4%: INTRAVENOUS | Qty: 50 | Status: AC

## 2018-11-30 NOTE — Telephone Encounter (Signed)
**Note De-Identified George Munoz Obfuscation** Patient contacted regarding discharge from [facility] on 11/29/2018.  Patient understands to follow up with provider Daune Perch, NP on 12/08/2018 at 9:00 at [facility] in [place]. Patient understands discharge instructions? Yes Patient understands medications and regiment? Yes Patient understands to bring all medications to this visit? Yes

## 2018-12-01 DIAGNOSIS — Z8679 Personal history of other diseases of the circulatory system: Secondary | ICD-10-CM | POA: Diagnosis not present

## 2018-12-01 DIAGNOSIS — Z951 Presence of aortocoronary bypass graft: Secondary | ICD-10-CM | POA: Diagnosis not present

## 2018-12-01 DIAGNOSIS — F419 Anxiety disorder, unspecified: Secondary | ICD-10-CM | POA: Diagnosis not present

## 2018-12-06 ENCOUNTER — Ambulatory Visit (INDEPENDENT_AMBULATORY_CARE_PROVIDER_SITE_OTHER): Payer: Medicaid Other

## 2018-12-06 DIAGNOSIS — I480 Paroxysmal atrial fibrillation: Secondary | ICD-10-CM

## 2018-12-06 DIAGNOSIS — I469 Cardiac arrest, cause unspecified: Secondary | ICD-10-CM | POA: Diagnosis not present

## 2018-12-08 ENCOUNTER — Ambulatory Visit: Payer: Medicaid Other | Admitting: Cardiology

## 2018-12-08 ENCOUNTER — Emergency Department (HOSPITAL_COMMUNITY): Payer: Medicaid Other

## 2018-12-08 ENCOUNTER — Encounter (HOSPITAL_COMMUNITY): Payer: Self-pay | Admitting: Emergency Medicine

## 2018-12-08 ENCOUNTER — Emergency Department (HOSPITAL_COMMUNITY)
Admission: EM | Admit: 2018-12-08 | Discharge: 2018-12-08 | Disposition: A | Discharge status: Home / Self Care | Payer: Medicaid Other | Attending: Emergency Medicine | Admitting: Emergency Medicine

## 2018-12-08 ENCOUNTER — Encounter: Payer: Self-pay | Admitting: Cardiology

## 2018-12-08 DIAGNOSIS — F17210 Nicotine dependence, cigarettes, uncomplicated: Secondary | ICD-10-CM | POA: Diagnosis not present

## 2018-12-08 DIAGNOSIS — R55 Syncope and collapse: Secondary | ICD-10-CM | POA: Diagnosis not present

## 2018-12-08 DIAGNOSIS — Z951 Presence of aortocoronary bypass graft: Secondary | ICD-10-CM | POA: Diagnosis not present

## 2018-12-08 DIAGNOSIS — Z7901 Long term (current) use of anticoagulants: Secondary | ICD-10-CM | POA: Diagnosis not present

## 2018-12-08 DIAGNOSIS — Z79899 Other long term (current) drug therapy: Secondary | ICD-10-CM | POA: Insufficient documentation

## 2018-12-08 DIAGNOSIS — R457 State of emotional shock and stress, unspecified: Secondary | ICD-10-CM | POA: Diagnosis not present

## 2018-12-08 DIAGNOSIS — R079 Chest pain, unspecified: Secondary | ICD-10-CM | POA: Diagnosis not present

## 2018-12-08 DIAGNOSIS — Z7982 Long term (current) use of aspirin: Secondary | ICD-10-CM | POA: Diagnosis not present

## 2018-12-08 DIAGNOSIS — J9811 Atelectasis: Secondary | ICD-10-CM | POA: Diagnosis not present

## 2018-12-08 LAB — CBC
HCT: 42.8 % (ref 39.0–52.0)
Hemoglobin: 13.7 g/dL (ref 13.0–17.0)
MCH: 28.5 pg (ref 26.0–34.0)
MCHC: 32 g/dL (ref 30.0–36.0)
MCV: 89 fL (ref 80.0–100.0)
Platelets: 498 10*3/uL — ABNORMAL HIGH (ref 150–400)
RBC: 4.81 MIL/uL (ref 4.22–5.81)
RDW: 14.1 % (ref 11.5–15.5)
WBC: 10.6 10*3/uL — ABNORMAL HIGH (ref 4.0–10.5)
nRBC: 0 % (ref 0.0–0.2)

## 2018-12-08 LAB — TROPONIN I (HIGH SENSITIVITY)
Troponin I (High Sensitivity): 24 ng/L — ABNORMAL HIGH (ref <18)
Troponin I (High Sensitivity): 23 ng/L — ABNORMAL HIGH (ref <18)

## 2018-12-08 LAB — BASIC METABOLIC PANEL
Anion gap: 9 (ref 5–15)
BUN: 14 mg/dL (ref 6–20)
CO2: 24 mmol/L (ref 22–32)
Calcium: 9 mg/dL (ref 8.9–10.3)
Chloride: 106 mmol/L (ref 98–111)
Creatinine, Ser: 1.19 mg/dL (ref 0.61–1.24)
GFR calc Af Amer: >60 mL/min (ref >60)
GFR calc non Af Amer: >60 mL/min (ref >60)
Glucose, Bld: 106 mg/dL — ABNORMAL HIGH (ref 70–99)
Potassium: 4.3 mmol/L (ref 3.5–5.1)
Sodium: 139 mmol/L (ref 135–145)

## 2018-12-08 MED ORDER — SODIUM CHLORIDE 0.9 % IV BOLUS
1000.0000 mL | Freq: Once | INTRAVENOUS | Status: AC
  Administered 2018-12-08: 13:00:00 1000 mL via INTRAVENOUS

## 2018-12-08 MED ORDER — SODIUM CHLORIDE 0.9 % IV BOLUS
1000.0000 mL | Freq: Once | INTRAVENOUS | Status: AC
  Administered 2018-12-08: 1000 mL via INTRAVENOUS

## 2018-12-08 NOTE — Progress Notes (Deleted)
Cardiology Office Note:    Date:  12/08/2018   ID:  George Munoz, DOB 1970-11-30, MRN [id number]  PCP:  [place]  Cardiologist:  No primary care provider on file.  Referring MD: [facility]*   No chief complaint on file. ***  History of Present Illness:    George Munoz is a 48 y.o. male with a past medical history significant for depression/anxiety.  He was admitted to the hospital following cardiac arrest at home.  CPR was done by his girlfriend for 25 minutes prior to EMS arrival.  EMS noted rapid atrial fib on arrival then ventricular fibrillation requiring defibrillation.  He had diffuse ischemic changes on EKG.  He was found to have multivessel CAD on cath with overall low normal LV systolic function.  No thrombotic lesions on cath.  EF was 60-65% on echo.  He was referred for cardiothoracic surgery and underwent three-vessel CABG on 11/25/2018, LIMA-LAD, SVG-OM1, SVG-D2 and left atrial appendage clipping.  The patient had no prior history of atrial fibrillation and he maintained normal sinus rhythm throughout the remainder of the hospitalization.  He was recommended to have outpatient event monitor to assess for silent A. fib.  Discharged on aspirin 81 mg, Lipitor 80 mg, Lopressor 25 mg twice daily and Plavix given presentation with ACS.  He was given Lasix for 4 days after discharge.  George Munoz is here today for postoperative follow-up  TOC  Past Medical History:  Diagnosis Date  . Anxiety 11/23/2018  . Restless leg   . Tobacco abuse     Past Surgical History:  Procedure Laterality Date  . CLIPPING OF ATRIAL APPENDAGE N/A 11/25/2018   Procedure: CLIPPING OF ATRIAL APPENDAGE;  Surgeon: Lajuana Matte, MD;  Location: [place];  Service: Open Heart Surgery;  Laterality: N/A;  FLOW TRACK  . CORONARY ARTERY BYPASS GRAFT N/A 11/25/2018   Procedure: CORONARY ARTERY BYPASS GRAFTING (CABG) times three, using left mammary artery and  right greater saphenous vein harvested endoscopically;  Surgeon: Lajuana Matte, MD;  Location: [place];  Service: Open Heart Surgery;  Laterality: N/A;  . LEFT HEART CATH AND CORONARY ANGIOGRAPHY N/A 11/21/2018   Procedure: LEFT HEART CATH AND CORONARY ANGIOGRAPHY;  Surgeon: Burnell Blanks, MD;  Location: [place] [facility];  Service: Cardiovascular;  Laterality: N/A;  . TEE WITHOUT CARDIOVERSION N/A 11/25/2018   Procedure: TRANSESOPHAGEAL ECHOCARDIOGRAM (TEE);  Surgeon: Lajuana Matte, MD;  Location: [place];  Service: Open Heart Surgery;  Laterality: N/A;    Current Medications: No outpatient medications have been marked as taking for the 12/08/18 encounter (Appointment) with Daune Perch, NP.     Allergies:   Patient has no known allergies.   Social History   Socioeconomic History  . Marital status: Single    Spouse name: Not on file  . Number of children: Not on file  . Years of education: Not on file  . Highest education level: Not on file  Occupational History  . Not on file  Social Needs  . Financial resource strain: Not on file  . Food insecurity    Worry: Not on file    Inability: Not on file  . Transportation needs    Medical: Not on file    Non-medical: Not on file  Tobacco Use  . Smoking status: Current Every Day Smoker    Packs/day: 1.00    Types: Cigarettes  . Smokeless tobacco: Never Used  Substance and Sexual Activity  . Alcohol use:  Not Currently  . Drug use: Never  . Sexual activity: Not on file  Lifestyle  . Physical activity    Days per week: Not on file    Minutes per session: Not on file  . Stress: Not on file  Relationships  . Social Musician on phone: Not on file    Gets together: Not on file    Attends religious service: Not on file    Active member of club or organization: Not on file    Attends meetings of clubs or organizations: Not on file    Relationship status: Not on file  Other Topics Concern  . Not  on file  Social History Narrative  . Not on file     Family History: The patient's ***family history includes Hypertension in his father. ROS:   Please see the history of present illness.    *** All other systems reviewed and are negative.  EKGs/Labs/Other Studies Reviewed:    The following studies were reviewed today:  Cardiac cath 11/21/18:  Mid RCA lesion is 40% stenosed.  Prox Cx lesion is 99% stenosed.  1st Mrg lesion is 99% stenosed.  2nd Diag lesion is 90% stenosed.  Ost LM to Dist LM lesion is 50% stenosed.  Prox RCA lesion is 50% stenosed.  There is mild left ventricular systolic dysfunction.  LV end diastolic pressure is normal.  The left ventricular ejection fraction is 45-50% by visual estimate.  There is no mitral valve regurgitation.  Mid LAD lesion is 95% stenosed.  1. Moderate distal left main stenosis 2. Severe mid LAD stenosis 3. Severe mid Circumflex stenosis involving the ostium of a very large obtuse marginal branch 4. Moderate mid RCA stenosis 5. Mild segmental LV systolic dysfunction (LVEF=45-50%).  6. Post cardiac arrest (ventricular fibrillation) 7. Rapid atrial fibrillation  Echo 11/22/18: 1. The left ventricle has normal systolic function with an ejection fraction of 60-65%. The cavity size was normal. Left ventricular diastolic parameters were normal. 2. The right ventricle has normal systolic function. The cavity was normal. There is no increase  EKG:  EKG is *** ordered today.  The ekg ordered today demonstrates ***  Recent Labs: 11/22/2018: ALT 49; TSH 1.746 11/26/2018: Magnesium 2.1 11/27/2018: BUN 10; Creatinine, Ser 1.06; Hemoglobin 12.3; Platelets 214; Potassium 3.9; Sodium 136   Recent Lipid Panel    Component Value Date/Time   TRIG 109 11/24/2018 2251    Physical Exam:    VS:  There were no vitals taken for this visit.    Wt Readings from Last 3 Encounters:  11/29/18 186 lb 14.4 oz (84.8 kg)     Physical  Exam***   ASSESSMENT:    No diagnosis found. PLAN:    In order of problems listed above:  CAD status post cardiac arrest -Patient had cardiac arrest at home, found to be in A. fib with RVR by EMS but deteriorated to V. fib requiring defibrillation. -Patient found to have multivessel disease on cath and underwent three-vessel CABG with left atrial clipping on 11/25/2018. -Treatment now includes aspirin 81 mg, Plavix 75 mg, statin and beta-blocker -     Atrial fibrillation, paroxysmal -Noted by EMS during cardiac arrest with also severe severe ASCAD.  No further A. fib noted during hospitalization. -We will check event monitor for silent A. fib.  Hyperlipidemia -Started on atorvastatin 80 mg daily. -Follow-up FLP and ALT in 6 weeks.  Medication Adjustments/Labs and Tests Ordered: Current medicines are reviewed at length with  the patient today.  Concerns regarding medicines are outlined above. Labs and tests ordered and medication changes are outlined in the patient instructions below:  There are no Patient Instructions on file for this visit.   Signed, Berton BonJanine Viveca Beckstrom, NP  12/08/2018 4:45 AM    [facility]

## 2018-12-08 NOTE — ED Provider Notes (Signed)
[facility]   CSN: [id number] Arrival date & time: 12/08/18  0844    History   Chief Complaint Chief Complaint  Patient presents with  . Near Syncope    HPI George Munoz is a 48 y.o. male with history of anxiety, CAD status post cardiac arrest on 11/21/2018, status post CABG x3 on 11/25/2018, hyperlipidemia, tobacco abuse, A. fib with RVR presenting brought in by EMS for evaluation of cute onset, resolved near syncopal episode.  Recent admission for STEMI presenting as cardiac arrest on 11/21/2018. per his girlfriend who is at the bedside, she awoke him from sleep at around 7 AM.  She states that he went to the bathroom while she was preparing his medications when he called out to her.  She reports that she found him pale, diaphoretic, complaining of nausea.  She reports he did not lose consciousness and she helped assist him to the bedroom.  They pressed on his Holter monitor and then called for EMS.  The patient told me that he thought that he had a tablet of oxycodone prior to going to the bathroom but his girlfriend states otherwise.  He states that he had the urge to have a bowel movement but while sitting on the commode he began to feel flushed, diaphoretic, lightheaded, and nauseated.  No abdominal pain or chest pain.  He states the episode lasted around 20 minutes, girlfriend states around 40 minutes.  He reports that after the episode resolved he began to feel somewhat anxious, hyperventilating, and had some fingertip numbness bilaterally.  He is currently on Plavix and baby aspirin but has not had any of his morning medications today.  He had an appointment to see cardiology today.  He is a current smoker of around three quarters of a pack to a full pack of cigarettes daily.  No recreational drug use or alcohol intake.     The history is provided by the patient.    Past Medical History:  Diagnosis Date  . Anxiety 11/23/2018   . CAD (coronary artery disease)    S/P cardiac arrest 11/21/18, Cath-MVD, 11/25/18-3V CABG and LA clipping  . Hyperlipidemia LDL goal <70   . Restless leg   . Tobacco abuse     Patient Active Problem List   Diagnosis Date Noted  . Near syncope   . Vasovagal near syncope   . S/P CABG x 3 11/25/2018  . NSTEMI (non-ST elevated myocardial infarction) (HCC) 11/21/2018  . Atrial fibrillation with RVR (HCC)   . Acute respiratory failure (HCC)   . Endotracheally intubated   . Cardiac arrest [facility](HCC)     Past Surgical History:  Procedure Laterality Date  . CLIPPING OF ATRIAL APPENDAGE N/A 11/25/2018   Procedure: CLIPPING OF ATRIAL APPENDAGE;  Surgeon: Corliss SkainsLightfoot, Harrell O, MD;  Location: [facility] OR;  Service: Open Heart Surgery;  Laterality: N/A;  FLOW TRACK  . CORONARY ARTERY BYPASS GRAFT N/A 11/25/2018   Procedure: CORONARY ARTERY BYPASS GRAFTING (CABG) times three, using left mammary artery and right greater saphenous vein harvested endoscopically;  Surgeon: Corliss SkainsLightfoot, Harrell O, MD;  Location: [facility] OR;  Service: Open Heart Surgery;  Laterality: N/A;  . LEFT HEART CATH AND CORONARY ANGIOGRAPHY N/A 11/21/2018   Procedure: LEFT HEART CATH AND CORONARY ANGIOGRAPHY;  Surgeon: Kathleene HazelMcAlhany, Christopher D, MD;  Location: [facility] INVASIVE CV LAB;  Service: Cardiovascular;  Laterality: N/A;  . TEE WITHOUT CARDIOVERSION N/A 11/25/2018   Procedure: TRANSESOPHAGEAL ECHOCARDIOGRAM (TEE);  Surgeon: Brynda GreathouseLightfoot, Harrell  O, MD;  Location: [facility];  Service: Open Heart Surgery;  Laterality: N/A;        Home Medications    Prior to Admission medications   Medication Sig Start Date End Date Taking? Authorizing Provider  acetaminophen (TYLENOL) 500 MG tablet Take 1,000 mg by mouth every 6 (six) hours as needed for mild pain or headache.   Yes [provider]  aspirin EC 81 MG tablet Take 1 tablet (81 mg total) by mouth daily. 11/29/18  Yes Lars Pinks M, PA-C  atorvastatin (LIPITOR) 80 MG tablet Take 1 tablet (80  mg total) by mouth daily at 6 PM. 11/29/18  Yes Lars Pinks M, PA-C  clopidogrel (PLAVIX) 75 MG tablet Take 1 tablet (75 mg total) by mouth daily. 11/29/18  Yes Lars Pinks M, PA-C  FLUoxetine (PROZAC) 10 MG tablet Take 10 mg by mouth daily.   Yes [provider]  metoprolol tartrate (LOPRESSOR) 25 MG tablet Take 1 tablet (25 mg total) by mouth 2 (two) times daily. 11/29/18  Yes Lars Pinks M, PA-C  Multiple Vitamin (MULTIVITAMIN) tablet Take 1 tablet by mouth daily.   Yes [provider]  NON FORMULARY Take 1 tablet by mouth at bedtime. [place] Restless leg tablets   Yes [provider]  omega-3 acid ethyl esters (LOVAZA) 1 g capsule Take 1 g by mouth daily.   Yes [provider]  ondansetron (ZOFRAN) 4 MG tablet Take 4 mg by mouth every 8 (eight) hours as needed for nausea or vomiting.   Yes [provider]  oxyCODONE (OXY IR/ROXICODONE) 5 MG immediate release tablet Take 1 tablet (5 mg total) by mouth every 4 (four) hours as needed for severe pain. 11/29/18  Yes Nani Skillern, PA-C    Family History Family History  Problem Relation Age of Onset  . Hypertension Father     Social History Social History   Tobacco Use  . Smoking status: Current Every Day Smoker    Packs/day: 1.00    Types: Cigarettes  . Smokeless tobacco: Never Used  Substance Use Topics  . Alcohol use: Not Currently  . Drug use: Never     Allergies   Patient has no known allergies.   Review of Systems Review of Systems  Constitutional: Positive for diaphoresis. Negative for chills and fever.  Eyes: Negative for visual disturbance.  Respiratory: Positive for shortness of breath.   Cardiovascular: Negative for chest pain and palpitations.  Gastrointestinal: Positive for nausea. Negative for abdominal pain and vomiting.  Neurological: Positive for light-headedness. Negative for syncope, weakness, numbness and headaches.  All other systems  reviewed and are negative.    Physical Exam Updated Vital Signs BP 119/79   Pulse 70   Temp 98.4 F (36.9 C) (Oral)   Resp 17   SpO2 98%   Physical Exam Vitals signs and nursing note reviewed.  Constitutional:      General: He is not in acute distress.    Appearance: He is well-developed.  HENT:     Head: Normocephalic and atraumatic.  Eyes:     General:        Right eye: No discharge.        Left eye: No discharge.     Conjunctiva/sclera: Conjunctivae normal.     Pupils: Pupils are equal, round, and reactive to light.  Neck:     Musculoskeletal: Normal range of motion and neck supple.     Vascular: No JVD.     Trachea: No  tracheal deviation.  Cardiovascular:     Rate and Rhythm: Normal rate.  Pulmonary:     Effort: Pulmonary effort is normal.     Breath sounds: Normal breath sounds.  Chest:     Chest wall: No tenderness or crepitus.     Comments: Well-healing midline sternotomy scar Abdominal:     General: Bowel sounds are normal. There is no distension.     Palpations: Abdomen is soft.     Tenderness: There is no abdominal tenderness. There is no guarding or rebound.  Skin:    General: Skin is warm and dry.     Findings: No erythema.  Neurological:     General: No focal deficit present.     Mental Status: He is alert.  Psychiatric:        Behavior: Behavior normal.      ED Treatments / Results  Labs (all labs ordered are listed, but only abnormal results are displayed) Labs Reviewed  BASIC METABOLIC PANEL - Abnormal; Notable for the following components:      Result Value   Glucose, Bld 106 (*)    All other components within normal limits  CBC - Abnormal; Notable for the following components:   WBC 10.6 (*)    Platelets 498 (*)    All other components within normal limits  TROPONIN I (HIGH SENSITIVITY) - Abnormal; Notable for the following components:   Troponin I (High Sensitivity) 24 (*)    All other components within normal limits  TROPONIN I  (HIGH SENSITIVITY) - Abnormal; Notable for the following components:   Troponin I (High Sensitivity) 23 (*)    All other components within normal limits    EKG EKG Interpretation  Date/Time:  Wednesday December 08 2018 08:50:49 EDT Ventricular Rate:  72 PR Interval:    QRS Duration: 92 QT Interval:  416 QTC Calculation: 456 R Axis:   82 Text Interpretation:  Sinus rhythm new Nonspecific T abnrm, anterolateral leads Confirmed by Gwyneth Sprout ([id number]) on 12/08/2018 10:29:55 AM   Radiology Dg Chest Port 1 View  Result Date: 12/08/2018 CLINICAL DATA:  Near syncope EXAM: PORTABLE CHEST 1 VIEW COMPARISON:  November 28, 2018 FINDINGS: The heart size and mediastinal contours are stable. Patient status post prior median sternotomy and CABG. Minimal atelectasis of left lung base is identified. There is no focal infiltrate, pulmonary edema. The visualized skeletal structures are stable. IMPRESSION: Minimal atelectasis of left lung base.  No focal pneumonia. Electronically Signed   By: Sherian Rein M.D.   On: 12/08/2018 09:51    Procedures Procedures (including critical care time)  Medications Ordered in ED Medications  sodium chloride 0.9 % bolus 1,000 mL (0 mLs Intravenous Stopped 12/08/18 1326)  sodium chloride 0.9 % bolus 1,000 mL (0 mLs Intravenous Stopped 12/08/18 1444)     Initial Impression / Assessment and Plan / ED Course  I have reviewed the triage vital signs and the nursing notes.  Pertinent labs & imaging results that were available during my care of the patient were reviewed by me and considered in my medical decision making (see chart for details).        Patient presenting for evaluation of near syncopal episode.  Recent CABG last month after cardiac arrest secondary to NSTEMI.   He is asymptomatic on my initial assessment. He is afebrile, mildly hypotensive on initial assessment with improvement after fluid bolus.  He is not orthostatic, but pressures remained soft from  laying to standing.   EKG shows  T wave inversions in the anterior lateral leads which is new since his procedure.  Remainder of blood work reviewed by me shows a mild nonspecific leukocytosis which is an improvement compared to most recent labs, no anemia.  No metabolic derangements.  No renal insufficiency.  Initial high-sensitivity troponin mildly elevated at 24 that this is a marked improvement compared to when he was admitted for his NSTEMI.  Serial troponins are stable.  Chest x-ray shows no focal pneumonia, no cardiomegaly but some atelectasis of the left lung base.  Doubt PE, cardiac tamponade, esophageal rupture, dissection, or pneumothorax.  Low suspicion of MI given stable troponins.  Cardiology was consulted and saw the patient emergently in the ED.  They recommend discharge home, encouraged fluid rehydration, and scheduled the patient for outpatient follow-up.  On reassessment patient resting comfortably in no apparent distress, ambulatory without difficulty, no further lightheadedness.  Suspect that his near syncope was vasovagal in nature.  He is tolerating p.o. fluids and food without difficulty.  Discussed outpatient follow-up as arranged by cardiology.  Discussed strict ED return precautions.  Patient and significant other verbalized understanding of and agreement with plan and patient stable for discharge home at this time.  Final Clinical Impressions(s) / ED Diagnoses   Final diagnoses:  Near syncope    ED Discharge Orders    None       Bennye AlmFawze, Valentina Alcoser A, PA-C 12/08/18 1548    Gwyneth SproutPlunkett, Whitney, MD 12/08/18 2048

## 2018-12-08 NOTE — Consult Note (Signed)
Cardiology Consult:   Patient ID: George Munoz MRN: [id number]; DOB: [birth year]   Admission date: 12/08/2018  [facility] Primary Cardiologist: Verne Carrowhristopher McAlhany, MD  Primary Electrophysiologist:  None   Chief Complaint: syncope  Patient Profile:   George Munoz is a 48 y.o. male with a PMH of CAD s/p cardiac arrest 10/2018 and subsequent CABG, paroxysmal atrial fibrillation, who is currently wearing a cardiac event monitor, presented with syncope. Cardiology asked to evaluate patient by Michela PitcherMina Fawze, PA-C.  History of Present Illness:   Mr. George Munoz was in his usual state of health until this morning when he experienced a presyncopal event while having a bowel movement. He reported that he did not take his stool softeners yesterday and was straining to have a bowel movement when he began feeling diaphoretic, nauseous, and lightheaded. He did not lose consciousness. His girlfriend assisted him to the bed where he continued to feel poorly and reports he started having a panic attack. EMS was activated and patient was brought to the ED for further evaluation.    Patient was recently admitted from 11/21/2018 to 11/29/2018 after presenting with cardiac arrest. Troponins peaked at 11, 291. He was found to have moderate distal LM and RCA stenosis and severe mLAD and LCx. He underwent CABG 11/25/2018 with left atrial clip placement given possible atrial fibrillation after cardiac arrest. Echo showed EF 60-65%, normal LV diastolic function, and no significant valvular abnormalities. Given concern for possible silent atrial fibrillation and no clear documentation, he was recommended for an outpatient 30 day event monitor which was started on 12/06/2018.  At the time of this evaluation he feels back to normal. No complaints of chest pain or palpitations in the surrounding events. He has been doing okay from a cardiac standpoint since discharge. Occasional  lightheadedness with position changes but no syncope. Poor appetite. Energy level is improving some. He continues to smoke 1ppd but is hopeful to quit. He is interested in a seeing a behavioral health specialist to assist with panic attacks and tobacco abuse.   ED course: VSS. Labs notable for electrolytes wnl, Cr 1.19 (baseline 0.8), WBC 10.6, Hgb 13.7, PLT 498, Trop 24>23. CXR with minimal LLL atelectasis. EKG with NSR, rate 72, QTc 456, no STE/D, non-specific T wave abnormalities in anterolateral leads; overall non-ischemic. He was given 2L IVF in the ED. Cardiology asked to evaluate for syncope.   Heart Pathway Score:     Past Medical History:  Diagnosis Date   Anxiety 11/23/2018   CAD (coronary artery disease)    S/P cardiac arrest 11/21/18, Cath-MVD, 11/25/18-3V CABG and LA clipping   Hyperlipidemia LDL goal <70    Restless leg    Tobacco abuse     Past Surgical History:  Procedure Laterality Date   CLIPPING OF ATRIAL APPENDAGE N/A 11/25/2018   Procedure: CLIPPING OF ATRIAL APPENDAGE;  Surgeon: Corliss SkainsLightfoot, Harrell O, MD;  Location: [facility] OR;  Service: Open Heart Surgery;  Laterality: N/A;  FLOW TRACK   CORONARY ARTERY BYPASS GRAFT N/A 11/25/2018   Procedure: CORONARY ARTERY BYPASS GRAFTING (CABG) times three, using left mammary artery and right greater saphenous vein harvested endoscopically;  Surgeon: Corliss SkainsLightfoot, Harrell O, MD;  Location: [facility] OR;  Service: Open Heart Surgery;  Laterality: N/A;   LEFT HEART CATH AND CORONARY ANGIOGRAPHY N/A 11/21/2018   Procedure: LEFT HEART CATH AND CORONARY ANGIOGRAPHY;  Surgeon: Kathleene HazelMcAlhany, Christopher D, MD;  Location: [facility] INVASIVE CV LAB;  Service: Cardiovascular;  Laterality: N/A;   TEE  WITHOUT CARDIOVERSION N/A 11/25/2018   Procedure: TRANSESOPHAGEAL ECHOCARDIOGRAM (TEE);  Surgeon: Corliss Skains, MD;  Location: [facility] OR;  Service: Open Heart Surgery;  Laterality: N/A;     Medications Prior to Admission: Prior to Admission medications     Medication Sig Start Date End Date Taking? Authorizing Provider  acetaminophen (TYLENOL) 500 MG tablet Take 1,000 mg by mouth every 6 (six) hours as needed for mild pain or headache.   Yes [provider]  aspirin EC 81 MG tablet Take 1 tablet (81 mg total) by mouth daily. 11/29/18  Yes Doree Fudge M, PA-C  atorvastatin (LIPITOR) 80 MG tablet Take 1 tablet (80 mg total) by mouth daily at 6 PM. 11/29/18  Yes Doree Fudge M, PA-C  clopidogrel (PLAVIX) 75 MG tablet Take 1 tablet (75 mg total) by mouth daily. 11/29/18  Yes Doree Fudge M, PA-C  FLUoxetine (PROZAC) 10 MG tablet Take 10 mg by mouth daily.   Yes [provider]  metoprolol tartrate (LOPRESSOR) 25 MG tablet Take 1 tablet (25 mg total) by mouth 2 (two) times daily. 11/29/18  Yes Doree Fudge M, PA-C  Multiple Vitamin (MULTIVITAMIN) tablet Take 1 tablet by mouth daily.   Yes [provider]  NON FORMULARY Take 1 tablet by mouth at bedtime. [place] Restless leg tablets   Yes [provider]  omega-3 acid ethyl esters (LOVAZA) 1 g capsule Take 1 g by mouth daily.   Yes [provider]  ondansetron (ZOFRAN) 4 MG tablet Take 4 mg by mouth every 8 (eight) hours as needed for nausea or vomiting.   Yes [provider]  oxyCODONE (OXY IR/ROXICODONE) 5 MG immediate release tablet Take 1 tablet (5 mg total) by mouth every 4 (four) hours as needed for severe pain. 11/29/18  Yes Ardelle Balls, PA-C     Allergies:   No Known Allergies  Social History:   Social History   Socioeconomic History   Marital status: Single    Spouse name: Not on file   Number of children: Not on file   Years of education: Not on file   Highest education level: Not on file  Occupational History   Not on file  Social Needs   Financial resource strain: Not on file   Food insecurity    Worry: Not on file    Inability: Not on file   Transportation needs    Medical: Not on file     Non-medical: Not on file  Tobacco Use   Smoking status: Current Every Day Smoker    Packs/day: 1.00    Types: Cigarettes   Smokeless tobacco: Never Used  Substance and Sexual Activity   Alcohol use: Not Currently   Drug use: Never   Sexual activity: Not on file  Lifestyle   Physical activity    Days per week: Not on file    Minutes per session: Not on file   Stress: Not on file  Relationships   Social connections    Talks on phone: Not on file    Gets together: Not on file    Attends religious service: Not on file    Active member of club or organization: Not on file    Attends meetings of clubs or organizations: Not on file    Relationship status: Not on file   Intimate partner violence    Fear of current or ex partner: Not on file    Emotionally abused: Not on file    Physically abused:  Not on file    Forced sexual activity: Not on file  Other Topics Concern   Not on file  Social History Narrative   Not on file    Family History:   The patient's family history includes Hypertension in his father.    ROS:  Please see the history of present illness.  All other ROS reviewed and negative.     Physical Exam/Data:   Vitals:   12/08/18 0930 12/08/18 0945 12/08/18 1100 12/08/18 1200  BP: 121/67 (!) 105/59 102/66 104/73  Pulse: 68 66 62 64  Resp: 15 19 15 19   Temp:      TempSrc:      SpO2: 96% 98% 98% 100%   No intake or output data in the 24 hours ending 12/08/18 1322 Last 3 Weights 11/29/2018 11/28/2018 11/27/2018  Weight (lbs) 186 lb 14.4 oz 191 lb 12.8 oz 195 lb 3.2 oz  Weight (kg) 84.777 kg 87 kg 88.542 kg     There is no height or weight on file to calculate BMI.  General:  Well nourished, well developed, in no acute distress HEENT: sclera anicteric Neck: no JVD Vascular: No carotid bruits; distal pulses 2+ bilaterally  Cardiac:  normal S1, S2; RRR; no murmurs, rubs, or gallops, sternal incision is healing well without drainage or erythema.   Lungs:  clear to auscultation bilaterally, no wheezing, rhonchi or rales  Abd: soft, nontender, no hepatomegaly  Ext: trace LE edema, vein graft sites healing well.  Musculoskeletal:  No deformities, BUE and BLE strength normal and equal Skin: warm and dry  Neuro:  CNs 2-12 intact, no focal abnormalities noted Psych:  Normal affect    EKG:  NSR, rate 72, QTc 456, no STE/D, non-specific T wave abnormalities in anterolateral leads; overall non-ischemic  Relevant CV Studies: Echocardiogram 11/22/2018:  1. The left ventricle has normal systolic function with an ejection fraction of 60-65%. The cavity size was normal. Left ventricular diastolic parameters were normal.  2. The right ventricle has normal systolic function. The cavity was normal. There is no increase  Left heart catheterization 11/21/2018:  Mid RCA lesion is 40% stenosed.  Prox Cx lesion is 99% stenosed.  1st Mrg lesion is 99% stenosed.  2nd Diag lesion is 90% stenosed.  Ost LM to Dist LM lesion is 50% stenosed.  Prox RCA lesion is 50% stenosed.  There is mild left ventricular systolic dysfunction.  LV end diastolic pressure is normal.  The left ventricular ejection fraction is 45-50% by visual estimate.  There is no mitral valve regurgitation.  Mid LAD lesion is 95% stenosed.   1. Moderate distal left main stenosis 2. Severe mid LAD stenosis 3. Severe mid Circumflex stenosis involving the ostium of a very large obtuse marginal branch 4. Moderate mid RCA stenosis 5. Mild segmental LV systolic dysfunction (IRWE=31-54%).  6. Post cardiac arrest (ventricular fibrillation) 7. Rapid atrial fibrillation  Recommendations: He has severe CAD but no thrombotic lesions. There is flow down all of the major vessels. He likely became ischemic today with severe underlying CAD and rapid atrial fibrillation. He will need consideration for CABG given the complexity of his anatomy and disease. Will admit to the ICU. Will resume  heparin tonight. Will start a low dose beta blocker. Will continue IV amiodarone for rate control of atrial fib. Start high intensity statin. Echo in the am. Cooling protocol per PCCM. If he has neurological recovery, will proceed with revascularization, likely CABG.   Laboratory Data:  High Sensitivity Troponin:  Recent Labs  Lab 11/21/18 1933 11/22/18 0914 11/22/18 1449 12/08/18 0908 12/08/18 1123  TROPONINIHS 23* 4,823* 11,291* 24* 23*      Cardiac EnzymesNo results for input(s): TROPONINI in the last 168 hours. No results for input(s): TROPIPOC in the last 168 hours.  Chemistry Recent Labs  Lab 12/08/18 0908  NA 139  K 4.3  CL 106  CO2 24  GLUCOSE 106*  BUN 14  CREATININE 1.19  CALCIUM 9.0  GFRNONAA >60  GFRAA >60  ANIONGAP 9    No results for input(s): PROT, ALBUMIN, AST, ALT, ALKPHOS, BILITOT in the last 168 hours. Hematology Recent Labs  Lab 12/08/18 0908  WBC 10.6*  RBC 4.81  HGB 13.7  HCT 42.8  MCV 89.0  MCH 28.5  MCHC 32.0  RDW 14.1  PLT 498*   BNPNo results for input(s): BNP, PROBNP in the last 168 hours.  DDimer No results for input(s): DDIMER in the last 168 hours.   Radiology/Studies:  Dg Chest Port 1 View  Result Date: 12/08/2018 CLINICAL DATA:  Near syncope EXAM: PORTABLE CHEST 1 VIEW COMPARISON:  November 28, 2018 FINDINGS: The heart size and mediastinal contours are stable. Patient status post prior median sternotomy and CABG. Minimal atelectasis of left lung base is identified. There is no focal infiltrate, pulmonary edema. The visualized skeletal structures are stable. IMPRESSION: Minimal atelectasis of left lung base.  No focal pneumonia. Electronically Signed   By: Sherian ReinWei-Chen  Lin M.D.   On: 12/08/2018 09:51    Assessment and Plan:   1. Pre-syncope: patient presented with a pre-syncopal event that occurred while attempting to have a bowel movement. He became diaphoretic, nauseated, and lightheaded prior to the event. No complaints of chest  pain or palpitations. Currently wearing a cardiac event monitor. [employer] contacted and reported patient was in sinus rhythm at that time with rates in the 90s to low 100s. EKG with non-specific T wave abnormalities but overall non-ischemic. Trop 24>23 flat trend, not consistent with ACS. Orthostatics negative. Patient with characteristic vasovagal episode - Continue stool softeners to avoid straining - Caution when changing positions  - Favor ongoing outpatient cardiac monitor to further evaluate for arrhythmia  2. CAD s/p CABG 11/25/2018: no complaints of chest pain. EKG non-ischemic. Trop 24>23, not consistent with ACS.  - Continue aspirin, plavix, and statin - Continue metoprolol  3. Paroxysmal atrial fibrillation: reported to have an episode following cardiac arrest prior to arrival to the ED. No recurrence during recent admission. Currently wearing a cardiac monitor. [employer] contacted and reports no arrhythmias identified at this time.  - Continue outpatient cardiac monitor  4. HLD:  - Continue statin  Patient was to see Lizabeth Leydenina Hammond in the office today. Appointment rescheduled for next week and AVS updated.    For questions or updates, please contact [facility] Please consult [email or web address] for contact info under        Signed, Beatriz StallionKrista M. Malone Admire, PA-C  12/08/2018 1:22 PM

## 2018-12-08 NOTE — ED Notes (Signed)
Pt stated "still dizzy while standing up". Notified Alexa(RN)

## 2018-12-08 NOTE — Discharge Instructions (Signed)
Continue taking your home medications as prescribed.  Drink plenty of fluids and get plenty of rest.  Please follow-up with cardiology.  Your appointments have been scheduled for you (see below).   Return to the emergency department if any concerning signs or symptoms develop such as chest pains, shortness of breath, high fevers, persistent vomiting, or loss of consciousness.

## 2018-12-08 NOTE — ED Triage Notes (Signed)
Pt here from home with c/o syncopal episode in the bathroom , pt states that he had a pain pill this morning on an empty stomach and soon after that he had a syncopal epiosde

## 2018-12-08 NOTE — ED Notes (Signed)
Patient verbalizes understanding of discharge instructions. Opportunity for questioning and answering were provided.  patient discharged from ED.  

## 2018-12-13 NOTE — Progress Notes (Signed)
Cardiology Office Note    Date:  12/14/2018   ID:  George Munoz, DOB [birth year], MRN [id number]  PCP:  [facility]  Cardiologist:  Dr. Angelena Form  Chief Complaint: ER follow up for presyncope  History of Present Illness:   George Munoz is a 48 y.o. male male with a PMH of CAD s/p cardiac arrest 10/2018 and subsequent CABG, paroxysmal atrial fibrillation who seen in ER 8/12 for presyncope while on monitor here for follow up.   Patient was recently admitted from 11/21/2018 to 11/29/2018 after presenting with cardiac arrest. Troponins peaked at 11, 291. He was found to have moderate distal LM and RCA stenosis and severe mLAD and LCx. He underwent CABG 11/25/2018 with left atrial clip placement given possible atrial fibrillation after cardiac arrest. Echo showed EF 60-65%, normal LV diastolic function, and no significant valvular abnormalities. Given concern for possible silent atrial fibrillation and no clear documentation, he was recommended for an outpatient 30 day event monitor which was started on 12/06/2018.  Seen in ER 12/08/2018 following presyncopal event while having a bowel movement. He did not lose consciousness. Dr. Martinique felt possibly exacerbated by bowel issues with narcotics. No arrhythmia. Advise to use stool softener, stay hydrated and avoid narcotic.   Today for follow-up.  No presyncopal episode since increase fluid intake.  He stopped to shortness secondary to diarrhea which resolved now.  He walks few minutes multiple times per day without chest pain or shortness of breath.  Denies palpitation, orthopnea, PND, syncope, lower extremity edema or melena.  Nauseated after eating.  Using probiotic.  wearing monitor for paroxysmal atrial fibrillation.      Past Medical History:  Diagnosis Date  . Anxiety 11/23/2018  . CAD (coronary artery disease)    S/P cardiac arrest 11/21/18, Cath-MVD, 11/25/18-3V CABG and LA clipping  . Hyperlipidemia LDL goal <70    . Restless leg   . Tobacco abuse     Past Surgical History:  Procedure Laterality Date  . CLIPPING OF ATRIAL APPENDAGE N/A 11/25/2018   Procedure: CLIPPING OF ATRIAL APPENDAGE;  Surgeon: Lajuana Matte, MD;  Location: [place];  Service: Open Heart Surgery;  Laterality: N/A;  FLOW TRACK  . CORONARY ARTERY BYPASS GRAFT N/A 11/25/2018   Procedure: CORONARY ARTERY BYPASS GRAFTING (CABG) times three, using left mammary artery and right greater saphenous vein harvested endoscopically;  Surgeon: Lajuana Matte, MD;  Location: [place];  Service: Open Heart Surgery;  Laterality: N/A;  . LEFT HEART CATH AND CORONARY ANGIOGRAPHY N/A 11/21/2018   Procedure: LEFT HEART CATH AND CORONARY ANGIOGRAPHY;  Surgeon: Burnell Blanks, MD;  Location: [place] [facility];  Service: Cardiovascular;  Laterality: N/A;  . TEE WITHOUT CARDIOVERSION N/A 11/25/2018   Procedure: TRANSESOPHAGEAL ECHOCARDIOGRAM (TEE);  Surgeon: Lajuana Matte, MD;  Location: [place];  Service: Open Heart Surgery;  Laterality: N/A;    Current Medications: Prior to Admission medications   Medication Sig Start Date End Date Taking? Authorizing Provider  acetaminophen (TYLENOL) 500 MG tablet Take 1,000 mg by mouth every 6 (six) hours as needed for mild pain or headache.    [provider]  aspirin EC 81 MG tablet Take 1 tablet (81 mg total) by mouth daily. 11/29/18   Nani Skillern, PA-C  atorvastatin (LIPITOR) 80 MG tablet Take 1 tablet (80 mg total) by mouth daily at 6 PM. 11/29/18   Nani Skillern, PA-C  clopidogrel (PLAVIX) 75 MG tablet Take 1 tablet (75 mg total)  by mouth daily. 11/29/18   Doree FudgeZimmerman, Donielle M, PA-C  FLUoxetine (PROZAC) 10 MG tablet Take 10 mg by mouth daily.    [provider]  metoprolol tartrate (LOPRESSOR) 25 MG tablet Take 1 tablet (25 mg total) by mouth 2 (two) times daily. 11/29/18   Ardelle BallsZimmerman, Donielle M, PA-C  Multiple Vitamin (MULTIVITAMIN) tablet Take 1 tablet by mouth  daily.    [provider]  NON FORMULARY Take 1 tablet by mouth at bedtime. [place]yl[place] Restless leg tablets    [provider]  omega-3 acid ethyl esters (LOVAZA) 1 g capsule Take 1 g by mouth daily.    [provider]  ondansetron (ZOFRAN) 4 MG tablet Take 4 mg by mouth every 8 (eight) hours as needed for nausea or vomiting.    [provider]  oxyCODONE (OXY IR/ROXICODONE) 5 MG immediate release tablet Take 1 tablet (5 mg total) by mouth every 4 (four) hours as needed for severe pain. 11/29/18   Ardelle BallsZimmerman, Donielle M, PA-C    Allergies:   Patient has no known allergies.   Social History   Socioeconomic History  . Marital status: Single    Spouse name: Not on file  . Number of children: Not on file  . Years of education: Not on file  . Highest education level: Not on file  Occupational History  . Not on file  Social Needs  . Financial resource strain: Not on file  . Food insecurity    Worry: Not on file    Inability: Not on file  . Transportation needs    Medical: Not on file    Non-medical: Not on file  Tobacco Use  . Smoking status: Current Every Day Smoker    Packs/day: 1.00    Types: Cigarettes  . Smokeless tobacco: Never Used  Substance and Sexual Activity  . Alcohol use: Not Currently  . Drug use: Never  . Sexual activity: Not on file  Lifestyle  . Physical activity    Days per week: Not on file    Minutes per session: Not on file  . Stress: Not on file  Relationships  . Social Musicianconnections    Talks on phone: Not on file    Gets together: Not on file    Attends religious service: Not on file    Active member of club or organization: Not on file    Attends meetings of clubs or organizations: Not on file    Relationship status: Not on file  Other Topics Concern  . Not on file  Social History Narrative  . Not on file     Family History:  The patient's family history includes Hypertension in his father.  ROS:   Please see the  history of present illness.    ROS All other systems reviewed and are negative.   PHYSICAL EXAM:   VS:  BP 100/80   Pulse 82   Ht 5\' 8"  (1.727 m)   Wt 183 lb 12.8 oz (83.4 kg)   SpO2 98%   BMI 27.95 kg/m    GEN: Well nourished, well developed, in no acute distress  HEENT: normal  Neck: no JVD, carotid bruits, or masses Cardiac: RRR; no murmurs, rubs, or gallops,no edema  Respiratory:  clear to auscultation bilaterally, normal work of breathing GI: soft, nontender, nondistended, + BS MS: no deformity or atrophy  Skin: warm and dry, no rash Neuro:  Alert and Oriented x 3, Strength and sensation are intact Psych: euthymic mood, full  affect  Wt Readings from Last 3 Encounters:  12/14/18 183 lb 12.8 oz (83.4 kg)  11/29/18 186 lb 14.4 oz (84.8 kg)      Studies/Labs Reviewed:   EKG:  EKG is ordered today.  The ekg ordered today demonstrates normal sinus rhythm at rate of 82 bpm with T wave inversion anteriorly  Recent Labs: 11/22/2018: ALT 49; TSH 1.746 11/26/2018: Magnesium 2.1 12/08/2018: BUN 14; Creatinine, Ser 1.19; Hemoglobin 13.7; Platelets 498; Potassium 4.3; Sodium 139   Lipid Panel    Component Value Date/Time   TRIG 109 11/24/2018 2251    Additional studies/ records that were reviewed today include:   Echocardiogram 11/22/2018: 1. The left ventricle has normal systolic function with an ejection fraction of 60-65%. The cavity size was normal. Left ventricular diastolic parameters were normal. 2. The right ventricle has normal systolic function. The cavity was normal. There is no increase  Left heart catheterization 11/21/2018:  Mid RCA lesion is 40% stenosed.  Prox Cx lesion is 99% stenosed.  1st Mrg lesion is 99% stenosed.  2nd Diag lesion is 90% stenosed.  Ost LM to Dist LM lesion is 50% stenosed.  Prox RCA lesion is 50% stenosed.  There is mild left ventricular systolic dysfunction.  LV end diastolic pressure is normal.  The left ventricular  ejection fraction is 45-50% by visual estimate.  There is no mitral valve regurgitation.  Mid LAD lesion is 95% stenosed.  1. Moderate distal left main stenosis 2. Severe mid LAD stenosis 3. Severe mid Circumflex stenosis involving the ostium of a very large obtuse marginal branch 4. Moderate mid RCA stenosis 5. Mild segmental LV systolic dysfunction (LVEF=45-50%).  6. Post cardiac arrest (ventricular fibrillation) 7. Rapid atrial fibrillation  Recommendations: He has severe CAD but no thrombotic lesions. There is flow down all of the major vessels. He likely became ischemic today with severe underlying CAD and rapid atrial fibrillation. He will need consideration for CABG given the complexity of his anatomy and disease. Will admit to the ICU. Will resume heparin tonight. Will start a low dose beta blocker. Will continue IV amiodarone for rate control of atrial fib. Start high intensity statin. Echo in the am. Cooling protocol per PCCM. If he has neurological recovery, will proceed with revascularization, likely CABG.     ASSESSMENT & PLAN:    1. CAD s/p CABG -No angina.  Continue aspirin, Plavix, statin and beta-blocker.  Surgical scar healing well.  He has some positional chest wall pain.  2. PAF -No palpitation.  On monitor  3. HTN -Blood pressure soft.  We will continue low-dose metoprolol. Advise to check BP periodically.   4. Presyncope -No recurrence since increased fluid intake  5.  Nauseated -Did not improve with as needed Zofran.  Advised small meal and look for any particular food causing him to have nausea. Trial of PPI.     Medication Adjustments/Labs and Tests Ordered: Current medicines are reviewed at length with the patient today.  Concerns regarding medicines are outlined above.  Medication changes, Labs and Tests ordered today are listed in the Patient Instructions below. Patient Instructions  Medication Instructions:  1.  START Protonix 40 mg taking 1  tablet daily  If you need a refill on your cardiac medications before your next appointment, please call your pharmacy.   Lab work: 01/25/2019:  COME TO THE OFFICE FOR LAB WORK, MAKE SURE YOU ARE FASTING..NOTHING TO EAT OR DRINK AFTER MIDNIGHT THE NIGHT BEFORE.  WE WILL CHECK A CHOLESTEROL  PANEL AND LIVER FUNCTION PANEL  If you have labs (blood work) drawn today and your tests are completely normal, you will receive your results only by: Marland Kitchen MyChart Message (if you have MyChart) OR . A paper copy in the mail If you have any lab test that is abnormal or we need to change your treatment, we will call you to review the results.  Testing/Procedures: None ordered  Follow-Up: At [facility], you and your health needs are our priority.  As part of our continuing mission to provide you with exceptional heart care, we have created designated Provider Care Teams.  These Care Teams include your primary Cardiologist (physician) and Advanced Practice Providers (APPs -  Physician Assistants and Nurse Practitioners) who all work together to provide you with the care you need, when you need it. You will need a follow up appointment in 3 months.   You may see Verne Carrow, MD or one of the following Advanced Practice Providers on your designated Care Team:   [place], PA-C Ronie Spies, PA-C . Jacolyn Reedy, PA-C  Any Other Special Instructions Will Be Listed Below (If Applicable).       Lorelei Pont, [place]  12/14/2018 11:48 AM    [facility] [address] [place], [place], [place]  [address] Phone: [phone]; Fax: [phone]

## 2018-12-14 ENCOUNTER — Other Ambulatory Visit: Payer: Self-pay

## 2018-12-14 ENCOUNTER — Ambulatory Visit: Payer: Medicaid Other | Admitting: Cardiology

## 2018-12-14 ENCOUNTER — Ambulatory Visit (INDEPENDENT_AMBULATORY_CARE_PROVIDER_SITE_OTHER): Payer: Medicaid Other | Admitting: Physician Assistant

## 2018-12-14 ENCOUNTER — Encounter: Payer: Self-pay | Admitting: Physician Assistant

## 2018-12-14 VITALS — BP 100/80 | HR 82 | Ht 68.0 in | Wt 183.8 lb

## 2018-12-14 DIAGNOSIS — I2510 Atherosclerotic heart disease of native coronary artery without angina pectoris: Secondary | ICD-10-CM

## 2018-12-14 DIAGNOSIS — I4891 Unspecified atrial fibrillation: Secondary | ICD-10-CM | POA: Diagnosis not present

## 2018-12-14 DIAGNOSIS — Z951 Presence of aortocoronary bypass graft: Secondary | ICD-10-CM

## 2018-12-14 DIAGNOSIS — R42 Dizziness and giddiness: Secondary | ICD-10-CM | POA: Diagnosis not present

## 2018-12-14 DIAGNOSIS — I214 Non-ST elevation (NSTEMI) myocardial infarction: Secondary | ICD-10-CM

## 2018-12-14 DIAGNOSIS — R55 Syncope and collapse: Secondary | ICD-10-CM | POA: Diagnosis not present

## 2018-12-14 DIAGNOSIS — R110 Nausea: Secondary | ICD-10-CM

## 2018-12-14 MED ORDER — PANTOPRAZOLE SODIUM 40 MG PO TBEC: 40.0000 mg | DELAYED_RELEASE_TABLET | Freq: Every day | ORAL | 2 refills | Status: DC

## 2018-12-14 NOTE — Patient Instructions (Addendum)
Medication Instructions:  1.  START Protonix 40 mg taking 1 tablet daily  If you need a refill on your cardiac medications before your next appointment, please call your pharmacy.   Lab work: 01/25/2019:  COME TO THE OFFICE FOR LAB WORK, MAKE SURE YOU ARE FASTING..NOTHING TO EAT OR DRINK AFTER MIDNIGHT THE NIGHT BEFORE.  WE WILL CHECK A CHOLESTEROL PANEL AND LIVER FUNCTION PANEL  If you have labs (blood work) drawn today and your tests are completely normal, you will receive your results only by: Marland Kitchen MyChart Message (if you have MyChart) OR . A paper copy in the mail If you have any lab test that is abnormal or we need to change your treatment, we will call you to review the results.  Testing/Procedures: None ordered  Follow-Up: At [facility], you and your health needs are our priority.  As part of our continuing mission to provide you with exceptional heart care, we have created designated Provider Care Teams.  These Care Teams include your primary Cardiologist (physician) and Advanced Practice Providers (APPs -  Physician Assistants and Nurse Practitioners) who all work together to provide you with the care you need, when you need it. You will need a follow up appointment in 3 months.   You may see Lauree Chandler, MD or one of the following Advanced Practice Providers on your designated Care Team:   [place], PA-C Melina Copa, PA-C . Ermalinda Barrios, PA-C  Any Other Special Instructions Will Be Listed Below (If Applicable).

## 2018-12-21 ENCOUNTER — Telehealth: Payer: Self-pay | Admitting: Cardiovascular Disease

## 2018-12-21 NOTE — Telephone Encounter (Signed)
New Message   Marjory Lies with Cardiac Rehab is calling in reference to referral. He wanted to let Dr. Julianne Handler know that the patient can not attend rehab due to cost

## 2018-12-21 NOTE — Telephone Encounter (Signed)
Thanks!    Chris.

## 2018-12-23 ENCOUNTER — Other Ambulatory Visit: Payer: Self-pay | Admitting: Thoracic Surgery (Cardiothoracic Vascular Surgery)

## 2018-12-23 DIAGNOSIS — Z951 Presence of aortocoronary bypass graft: Secondary | ICD-10-CM

## 2018-12-24 ENCOUNTER — Other Ambulatory Visit: Payer: Self-pay

## 2018-12-24 ENCOUNTER — Encounter: Payer: Self-pay | Admitting: Thoracic Surgery (Cardiothoracic Vascular Surgery)

## 2018-12-24 ENCOUNTER — Ambulatory Visit (INDEPENDENT_AMBULATORY_CARE_PROVIDER_SITE_OTHER): Payer: Self-pay | Admitting: Thoracic Surgery (Cardiothoracic Vascular Surgery)

## 2018-12-24 ENCOUNTER — Ambulatory Visit
Admission: RE | Admit: 2018-12-24 | Discharge: 2018-12-24 | Disposition: A | Discharge status: Home / Self Care | Payer: Medicaid Other | Source: Ambulatory Visit | Attending: Thoracic Surgery (Cardiothoracic Vascular Surgery) | Admitting: Thoracic Surgery (Cardiothoracic Vascular Surgery)

## 2018-12-24 VITALS — BP 101/68 | HR 71 | Temp 97.7°F | Resp 20 | Ht 68.0 in | Wt 186.0 lb

## 2018-12-24 DIAGNOSIS — I2510 Atherosclerotic heart disease of native coronary artery without angina pectoris: Secondary | ICD-10-CM

## 2018-12-24 DIAGNOSIS — Z951 Presence of aortocoronary bypass graft: Secondary | ICD-10-CM

## 2018-12-24 NOTE — Progress Notes (Signed)
      [address]       [place] [address]             [phone]        George Munoz [facility] Medical Record #[id number] Date of Birth: Aug 18, 1970  Referring: Burnell Blanks* Primary Care: [facility] Primary Cardiologist:Christopher Angelena Form, MD  Reason for visit:   follow-up  History of Present Illness:     1 month f/u after CABG atriclip.  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  Otherwise he has been fine.  He had 1 ED visit after a vasovagal episode while using the camode  Physical Exam: BP 101/68   Pulse 71   Temp 97.7 F (36.5 C) (Skin)   Resp 20   Ht 5\' 8"  (1.727 m)   Wt 186 lb (84.4 kg)   SpO2 98% Comment: RA  BMI 28.28 kg/m   Alert NAD RRR, no murmur.  Incision well healed .  Sternum stable Abdomen soft, NT/ND no peripheral edema   Diagnostic Studies & Laboratory data: CXR: clear     Assessment / Plan:   48 yo male s/p CABG atriclip following cardiac arrest.  Doing well Will speak with PCP about psych referral for anxiety and smoking cessation Clear for cardiac rehab Will return to work in 2 weeks.   Lajuana Matte 12/24/2018 11:48 AM

## 2019-01-25 ENCOUNTER — Other Ambulatory Visit: Payer: Self-pay

## 2019-01-25 ENCOUNTER — Other Ambulatory Visit: Payer: Medicaid Other

## 2019-01-25 ENCOUNTER — Other Ambulatory Visit: Payer: Self-pay | Admitting: Cardiovascular Disease

## 2019-01-25 DIAGNOSIS — I2510 Atherosclerotic heart disease of native coronary artery without angina pectoris: Secondary | ICD-10-CM

## 2019-01-25 LAB — HEPATIC FUNCTION PANEL
ALT: 24 IU/L (ref 0–44)
AST: 20 IU/L (ref 0–40)
Albumin: 4.1 g/dL (ref 4.0–5.0)
Alkaline Phosphatase: 104 IU/L (ref 39–117)
Bilirubin Total: 0.3 mg/dL (ref 0.0–1.2)
Bilirubin, Direct: 0.09 mg/dL (ref 0.00–0.40)
Total Protein: 6.6 g/dL (ref 6.0–8.5)

## 2019-01-25 LAB — LIPID PANEL
Chol/HDL Ratio: 3.1 ratio (ref 0.0–5.0)
Cholesterol, Total: 116 mg/dL (ref 100–199)
HDL: 37 mg/dL — ABNORMAL LOW (ref >39)
LDL Chol Calc (NIH): 56 mg/dL (ref 0–99)
Triglycerides: 132 mg/dL (ref 0–149)
VLDL Cholesterol Cal: 23 mg/dL (ref 5–40)

## 2019-01-25 MED ORDER — PANTOPRAZOLE SODIUM 40 MG PO TBEC: 40.0000 mg | DELAYED_RELEASE_TABLET | Freq: Every day | ORAL | 3 refills | Status: DC

## 2019-01-25 MED ORDER — METOPROLOL TARTRATE 25 MG PO TABS: 25.0000 mg | ORAL_TABLET | Freq: Two times a day (BID) | ORAL | 3 refills | Status: DC

## 2019-01-25 MED ORDER — CLOPIDOGREL BISULFATE 75 MG PO TABS: 75.0000 mg | ORAL_TABLET | Freq: Every day | ORAL | 3 refills | Status: DC

## 2019-01-25 MED ORDER — ATORVASTATIN CALCIUM 80 MG PO TABS: 80.0000 mg | ORAL_TABLET | Freq: Every day | ORAL | 3 refills | Status: DC

## 2019-01-25 NOTE — Telephone Encounter (Signed)
Pt's medications were sent to pt's pharmacy as requested. Confirmation received.  

## 2019-03-16 ENCOUNTER — Encounter: Payer: Self-pay | Admitting: Cardiovascular Disease

## 2019-03-16 ENCOUNTER — Ambulatory Visit: Payer: Medicaid Other | Admitting: Cardiovascular Disease

## 2019-03-16 ENCOUNTER — Other Ambulatory Visit: Payer: Self-pay

## 2019-03-16 VITALS — BP 124/82 | HR 67 | Ht 70.0 in | Wt 185.2 lb

## 2019-03-16 DIAGNOSIS — Z720 Tobacco use: Secondary | ICD-10-CM | POA: Diagnosis not present

## 2019-03-16 DIAGNOSIS — I480 Paroxysmal atrial fibrillation: Secondary | ICD-10-CM | POA: Diagnosis not present

## 2019-03-16 DIAGNOSIS — I10 Essential (primary) hypertension: Secondary | ICD-10-CM | POA: Diagnosis not present

## 2019-03-16 DIAGNOSIS — I2510 Atherosclerotic heart disease of native coronary artery without angina pectoris: Secondary | ICD-10-CM | POA: Diagnosis not present

## 2019-03-16 MED ORDER — VARENICLINE TARTRATE 1 MG PO TABS: 1.0000 mg | ORAL_TABLET | Freq: Two times a day (BID) | ORAL | 6 refills | Status: DC

## 2019-03-16 MED ORDER — VARENICLINE TARTRATE 0.5 MG PO TABS: 0.5000 mg | ORAL_TABLET | Freq: Two times a day (BID) | ORAL | 0 refills | Status: DC

## 2019-03-16 MED ORDER — VARENICLINE TARTRATE 0.5 MG PO TABS: ORAL_TABLET | ORAL | 0 refills | Status: DC

## 2019-03-16 NOTE — Addendum Note (Signed)
Addended by: Rodman Key on: 03/16/2019 02:21 PM   Modules accepted: Orders

## 2019-03-16 NOTE — Patient Instructions (Signed)
Medication Instructions:  Your physician has recommended you make the following change in your medication:  1.) CHANTIX - 0.5 MG TWICE A DAY FOR 7 DAYS THEN      Chantix 1 mg twice a day for smoking cessation  *If you need a refill on your cardiac medications before your next appointment, please call your pharmacy*  Lab Work: NONE If you have labs (blood work) drawn today and your tests are completely normal, you will receive your results only by: Marland Kitchen MyChart Message (if you have MyChart) OR . A paper copy in the mail If you have any lab test that is abnormal or we need to change your treatment, we will call you to review the results.  Testing/Procedures: NONE  Follow-Up: At [facility], you and your health needs are our priority.  As part of our continuing mission to provide you with exceptional heart care, we have created designated Provider Care Teams.  These Care Teams include your primary Cardiologist (physician) and Advanced Practice Providers (APPs -  Physician Assistants and Nurse Practitioners) who all work together to provide you with the care you need, when you need it.  Your next appointment:   6 month(s)  The format for your next appointment:   In Person  Provider:   Lauree Chandler, MD  Other Instructions

## 2019-03-16 NOTE — Progress Notes (Signed)
Chief Complaint  Patient presents with  . Follow-up    CAD    History of Present Illness: 48 yo male with history of CAD s/p CABG following cardiac arrest July 2020, PAF, hyperlipidemia and tobacco abuse who is here today for cardiac follow up. He was admitted to [facility] 11/21/18 following a cardiac arrest. He was found to have distal left main and triple vessel CAD and underwent 3V CABG on 11/25/18 (LIMA to LAD, SVG to DIag, SVG to OM) with left atrial clip placement. Echo July 2020 with LVEF=60-65% and no valve disease. Cardiac monitor August 2020 with sinus, no arrhythmias. He works at [employer].   He is here today for follow up. The patient denies any chest pain, dyspnea, palpitations, lower extremity edema, orthopnea, PND, dizziness, near syncope or syncope.   Primary Care Physician: [place]   Past Medical History:  Diagnosis Date  . Anxiety 11/23/2018  . CAD (coronary artery disease)    S/P cardiac arrest 11/21/18, Cath-MVD, 11/25/18-3V CABG and LA clipping  . Hyperlipidemia LDL goal <70   . Restless leg   . Tobacco abuse     Past Surgical History:  Procedure Laterality Date  . CLIPPING OF ATRIAL APPENDAGE N/A 11/25/2018   Procedure: CLIPPING OF ATRIAL APPENDAGE;  Surgeon: Lajuana Matte, MD;  Location: [place];  Service: Open Heart Surgery;  Laterality: N/A;  FLOW TRACK  . CORONARY ARTERY BYPASS GRAFT N/A 11/25/2018   Procedure: CORONARY ARTERY BYPASS GRAFTING (CABG) times three, using left mammary artery and right greater saphenous vein harvested endoscopically;  Surgeon: Lajuana Matte, MD;  Location: [place];  Service: Open Heart Surgery;  Laterality: N/A;  . LEFT HEART CATH AND CORONARY ANGIOGRAPHY N/A 11/21/2018   Procedure: LEFT HEART CATH AND CORONARY ANGIOGRAPHY;  Surgeon: Burnell Blanks, MD;  Location: [place] [facility];  Service: Cardiovascular;  Laterality: N/A;  . TEE WITHOUT CARDIOVERSION N/A 11/25/2018   Procedure: TRANSESOPHAGEAL  ECHOCARDIOGRAM (TEE);  Surgeon: Lajuana Matte, MD;  Location: [place];  Service: Open Heart Surgery;  Laterality: N/A;    Current Outpatient Medications  Medication Sig Dispense Refill  . acetaminophen (TYLENOL) 500 MG tablet Take 1,000 mg by mouth every 6 (six) hours as needed for mild pain or headache.    Marland Kitchen aspirin EC 81 MG tablet Take 1 tablet (81 mg total) by mouth daily.    Marland Kitchen atorvastatin (LIPITOR) 80 MG tablet Take 1 tablet (80 mg total) by mouth daily at 6 PM. 90 tablet 3  . clopidogrel (PLAVIX) 75 MG tablet Take 1 tablet (75 mg total) by mouth daily. 90 tablet 3  . FLUoxetine (PROZAC) 10 MG tablet Take 10 mg by mouth daily.    . metoprolol tartrate (LOPRESSOR) 25 MG tablet Take 1 tablet (25 mg total) by mouth 2 (two) times daily. 180 tablet 3  . Multiple Vitamin (MULTIVITAMIN) tablet Take 1 tablet by mouth daily.    . NON FORMULARY Take 1 tablet by mouth as needed. [place]ylands Restless leg tablets     . omega-3 acid ethyl esters (LOVAZA) 1 g capsule Take 1 g by mouth daily.    . pantoprazole (PROTONIX) 40 MG tablet Take 1 tablet (40 mg total) by mouth daily. 90 tablet 3  . varenicline (CHANTIX CONTINUING MONTH PAK) 1 MG tablet Take 1 tablet (1 mg total) by mouth 2 (two) times daily. 60 tablet 6  . varenicline (CHANTIX) 0.5 MG tablet Take 1 tablet (0.5 mg total) by mouth 2 (  two) times daily. 14 tablet 0   No current facility-administered medications for this visit.     No Known Allergies  Social History   Socioeconomic History  . Marital status: Single    Spouse name: Not on file  . Number of children: Not on file  . Years of education: Not on file  . Highest education level: Not on file  Occupational History  . Not on file  Social Needs  . Financial resource strain: Not on file  . Food insecurity    Worry: Not on file    Inability: Not on file  . Transportation needs    Medical: Not on file    Non-medical: Not on file  Tobacco Use  . Smoking status: Current Every  Day Smoker    Packs/day: 1.00    Types: Cigarettes  . Smokeless tobacco: Never Used  Substance and Sexual Activity  . Alcohol use: Not Currently  . Drug use: Never  . Sexual activity: Not on file  Lifestyle  . Physical activity    Days per week: Not on file    Minutes per session: Not on file  . Stress: Not on file  Relationships  . Social Musician on phone: Not on file    Gets together: Not on file    Attends religious service: Not on file    Active member of club or organization: Not on file    Attends meetings of clubs or organizations: Not on file    Relationship status: Not on file  . Intimate partner violence    Fear of current or ex partner: Not on file    Emotionally abused: Not on file    Physically abused: Not on file    Forced sexual activity: Not on file  Other Topics Concern  . Not on file  Social History Narrative  . Not on file    Family History  Problem Relation Age of Onset  . Hypertension Father     Review of Systems:  As stated in the HPI and otherwise negative.   BP 124/82   Pulse 67   Ht 5\' 10"  (1.778 m)   Wt 185 lb 3.2 oz (84 kg)   SpO2 98%   BMI 26.57 kg/m   Physical Examination: General: Well developed, well nourished, NAD  HEENT: OP clear, mucus membranes moist  SKIN: warm, dry. No rashes. Neuro: No focal deficits  Musculoskeletal: Muscle strength 5/5 all ext  Psychiatric: Mood and affect normal  Neck: No JVD, no carotid bruits, no thyromegaly, no lymphadenopathy.  Lungs:Clear bilaterally, no wheezes, rhonci, crackles Cardiovascular: Regular rate and rhythm. No murmurs, gallops or rubs. Abdomen:Soft. Bowel sounds present. Non-tender.  Extremities: No lower extremity edema. Pulses are 2 + in the bilateral DP/PT.  EKG:  EKG is not ordered today. The ekg ordered today demonstrates   Echo 11/22/18:  1. The left ventricle has normal systolic function with an ejection fraction of 60-65%. The cavity size was normal. Left  ventricular diastolic parameters were normal.  2. The right ventricle has normal systolic function. The cavity was normal. There is no increase  Cardiac cath 11/21/18:  Mid RCA lesion is 40% stenosed.  Prox Cx lesion is 99% stenosed.  1st Mrg lesion is 99% stenosed.  2nd Diag lesion is 90% stenosed.  Ost LM to Dist LM lesion is 50% stenosed.  Prox RCA lesion is 50% stenosed.  There is mild left ventricular systolic dysfunction.  LV end diastolic pressure  is normal.  The left ventricular ejection fraction is 45-50% by visual estimate.  There is no mitral valve regurgitation.  Mid LAD lesion is 95% stenosed.   1. Moderate distal left main stenosis 2. Severe mid LAD stenosis 3. Severe mid Circumflex stenosis involving the ostium of a very large obtuse marginal branch 4. Moderate mid RCA stenosis 5. Mild segmental LV systolic dysfunction (LVEF=45-50%).  6. Post cardiac arrest (ventricular fibrillation) 7. Rapid atrial fibrillation  Recent Labs: 11/22/2018: TSH 1.746 11/26/2018: Magnesium 2.1 12/08/2018: BUN 14; Creatinine, Ser 1.19; Hemoglobin 13.7; Platelets 498; Potassium 4.3; Sodium 139 01/25/2019: ALT 24   Lipid Panel    Component Value Date/Time   CHOL 116 01/25/2019 0803   TRIG 132 01/25/2019 0803   HDL 37 (L) 01/25/2019 0803   CHOLHDL 3.1 01/25/2019 0803   LDLCALC 56 01/25/2019 0803     Wt Readings from Last 3 Encounters:  03/16/19 185 lb 3.2 oz (84 kg)  12/24/18 186 lb (84.4 kg)  12/14/18 183 lb 12.8 oz (83.4 kg)     Assessment and Plan:   1. CAD s/p CABG: He underwent CABG July 2020. He is doing well. Will continue ASA, Plavix, statin and beta blocker.   2. Atrial fibrillation, paroxysmal: Atrial fib following cardiac arrest in July 202 prior to CABG. Left atrial appendage clipping July 2020. No recurrence of atrial fib noted on 30 day event monitor August 2020. Continue beta blocker.   3. HTN: BP controlled.   4. Tobacco abuse: Smoking cessation  advised. Will try Chantix.   Current medicines are reviewed at length with the patient today.  The patient does not have concerns regarding medicines.  The following changes have been made:  no change  Labs/ tests ordered today include:  No orders of the defined types were placed in this encounter.    Disposition:   FU with me in 6 months.    Signed, Verne Carrow, MD 03/16/2019 2:08 PM    [facility] [address] [place], [place], [place]  [address] Phone: [phone]; Fax: [phone]

## 2019-04-27 DIAGNOSIS — Z760 Encounter for issue of repeat prescription: Secondary | ICD-10-CM | POA: Diagnosis not present

## 2019-04-27 DIAGNOSIS — Z5181 Encounter for therapeutic drug level monitoring: Secondary | ICD-10-CM | POA: Diagnosis not present

## 2019-05-06 ENCOUNTER — Telehealth: Payer: Self-pay

## 2019-05-06 NOTE — Telephone Encounter (Signed)
Pt's girlfriend called to see if he could have a note to give pt permission to return to work without restrictions

## 2019-05-06 NOTE — Telephone Encounter (Signed)
Pt's girlfriend called to request a note that will clear pt to work without restrictions following CABG surgery in July 2020. Pt has been cleared to work w/o restrictions by other MDs, but employer is requesting clearance from Dr. Cliffton Asters. Pt denies any problems pertaining to surgery. Note to allow pt to return to work w/o restrictions is scanned and emailed for pt d/t him not having access to [employer].

## 2019-12-29 DIAGNOSIS — Z5321 Procedure and treatment not carried out due to patient leaving prior to being seen by health care provider: Secondary | ICD-10-CM | POA: Diagnosis not present

## 2019-12-29 DIAGNOSIS — R0602 Shortness of breath: Secondary | ICD-10-CM | POA: Diagnosis not present

## 2019-12-30 DIAGNOSIS — Z20822 Contact with and (suspected) exposure to covid-19: Secondary | ICD-10-CM | POA: Diagnosis not present

## 2020-01-24 ENCOUNTER — Telehealth: Payer: Self-pay | Admitting: Cardiovascular Disease

## 2020-01-24 MED ORDER — CLOPIDOGREL BISULFATE 75 MG PO TABS: 75.0000 mg | ORAL_TABLET | Freq: Every day | ORAL | 0 refills | Status: DC

## 2020-01-24 NOTE — Telephone Encounter (Signed)
Pt's medication was sent to pt's pharmacy as requested. Confirmation received.  °

## 2020-01-24 NOTE — Telephone Encounter (Signed)
*  STAT* If patient is at the pharmacy, call can be transferred to refill team.   1. Which medications need to be refilled? (please list name of each medication and dose if known) clopidogrel (PLAVIX) 75 MG tablet  2. Which pharmacy/location (including street and city if local pharmacy) is medication to be sent to? [facility] - [address] [place], [place], [place] [address]  3. Do they need a 30 day or 90 day supply? 90 day supply

## 2020-01-31 DIAGNOSIS — F410 Panic disorder [episodic paroxysmal anxiety] without agoraphobia: Secondary | ICD-10-CM | POA: Diagnosis not present

## 2020-01-31 DIAGNOSIS — M25561 Pain in right knee: Secondary | ICD-10-CM | POA: Diagnosis not present

## 2020-01-31 DIAGNOSIS — M25562 Pain in left knee: Secondary | ICD-10-CM | POA: Diagnosis not present

## 2020-01-31 DIAGNOSIS — Z6834 Body mass index (BMI) 34.0-34.9, adult: Secondary | ICD-10-CM | POA: Diagnosis not present

## 2020-01-31 DIAGNOSIS — E785 Hyperlipidemia, unspecified: Secondary | ICD-10-CM | POA: Diagnosis not present

## 2020-01-31 DIAGNOSIS — F419 Anxiety disorder, unspecified: Secondary | ICD-10-CM | POA: Diagnosis not present

## 2020-02-01 ENCOUNTER — Telehealth: Payer: Self-pay | Admitting: Cardiovascular Disease

## 2020-02-01 NOTE — Telephone Encounter (Signed)
I spoke with [name] (DPR) and then the paitent.  During exertion- walking carrying his child, physical labor at work-  SOB and chest tightness.  Has noticed since started the beta blocker but it does feel worse.  Has been taking lopressor 25 mg BID to "keep my heart in normal rhythm." Saw primary doctor yesterday who did not recommend ER but that he get in w cardiology ASAP.  They discussed that his HR does not get high because of his beta blocker and that if he doesn't take it he could have another heart attack.   His symptoms are not new but might be getting worse over the last few months.  It does not feel at all like when he had his heart attacks. There is no nausea, sweating or stabbing chest pain or significant fatigue.  I have scheduled him sooner with Leda Gauze, PA-C 02/14/20.  Pt appreciative for this appointment.  Aware that if symptoms worsen he is to go to the ER for urgent evaluation.

## 2020-02-01 NOTE — Telephone Encounter (Signed)
    Pt's wife calling, she said since pt been taking beta blockers and pt heart is slowing down. She wanted to know if Dr. Clifton James can see pt sooner than February. Offered APP but she said pt only wanted to see Dr. Clifton James

## 2020-02-01 NOTE — Telephone Encounter (Signed)
Thanks

## 2020-02-08 ENCOUNTER — Telehealth: Payer: Self-pay | Admitting: Cardiovascular Disease

## 2020-02-08 ENCOUNTER — Other Ambulatory Visit: Payer: Medicaid Other

## 2020-02-08 DIAGNOSIS — Z20822 Contact with and (suspected) exposure to covid-19: Secondary | ICD-10-CM

## 2020-02-08 MED ORDER — PANTOPRAZOLE SODIUM 40 MG PO TBEC: 40.0000 mg | DELAYED_RELEASE_TABLET | Freq: Every day | ORAL | 0 refills | Status: DC

## 2020-02-08 MED ORDER — METOPROLOL TARTRATE 25 MG PO TABS
25.0000 mg | ORAL_TABLET | Freq: Two times a day (BID) | ORAL | 0 refills | Status: DC
Start: 2020-02-08 — End: 2020-06-18

## 2020-02-08 NOTE — Progress Notes (Deleted)
Cardiology Office Note    Date:  02/08/2020   ID:  George Munoz, DOB Mar 15, 1971, MRN [id number]  PCP:  [facility]  Cardiologist: George Carrow, MD EPS: None  No chief complaint on file.   History of Present Illness:  George Munoz is a 49 y.o. male with history of CAD s/p CABG following cardiac arrest July 2020, PAF, hyperlipidemia and tobacco abuse who is here today for cardiac follow up. He was admitted to [facility] 11/21/18 following a cardiac arrest. He was found to have distal left main and triple vessel CAD and underwent 3V CABG on 11/25/18 (LIMA to LAD, SVG to DIag, SVG to OM) with left atrial clip placement. Echo July 2020 with LVEF=60-65% and no valve disease. Cardiac monitor August 2020 with sinus, no arrhythmias. He works at [employer].   Last saw Dr. Clifton Munoz 02/2019 and was doing well     Past Medical History:  Diagnosis Date  . Anxiety 11/23/2018  . CAD (coronary artery disease)    S/P cardiac arrest 11/21/18, Cath-MVD, 11/25/18-3V CABG and LA clipping  . Hyperlipidemia LDL goal <70   . Restless leg   . Tobacco abuse     Past Surgical History:  Procedure Laterality Date  . CLIPPING OF ATRIAL APPENDAGE N/A 11/25/2018   Procedure: CLIPPING OF ATRIAL APPENDAGE;  Surgeon: George Skains, MD;  Location: [facility] OR;  Service: Open Heart Surgery;  Laterality: N/A;  FLOW TRACK  . CORONARY ARTERY BYPASS GRAFT N/A 11/25/2018   Procedure: CORONARY ARTERY BYPASS GRAFTING (CABG) times three, using left mammary artery and right greater saphenous vein harvested endoscopically;  Surgeon: George Skains, MD;  Location: [facility] OR;  Service: Open Heart Surgery;  Laterality: N/A;  . LEFT HEART CATH AND CORONARY ANGIOGRAPHY N/A 11/21/2018   Procedure: LEFT HEART CATH AND CORONARY ANGIOGRAPHY;  Surgeon: George Hazel, MD;  Location: [facility] INVASIVE CV LAB;  Service: Cardiovascular;  Laterality: N/A;  . TEE WITHOUT CARDIOVERSION N/A 11/25/2018    Procedure: TRANSESOPHAGEAL ECHOCARDIOGRAM (TEE);  Surgeon: George Skains, MD;  Location: [facility] OR;  Service: Open Heart Surgery;  Laterality: N/A;    Current Medications: No outpatient medications have been marked as taking for the 02/14/20 encounter (Appointment) with George Kief, PA-C.     Allergies:   Patient has no known allergies.   Social History   Socioeconomic History  . Marital status: Single    Spouse name: Not on file  . Number of children: Not on file  . Years of education: Not on file  . Highest education level: Not on file  Occupational History  . Not on file  Tobacco Use  . Smoking status: Current Every Day Smoker    Packs/day: 1.00    Types: Cigarettes  . Smokeless tobacco: Never Used  Substance and Sexual Activity  . Alcohol use: Not Currently  . Drug use: Never  . Sexual activity: Not on file  Other Topics Concern  . Not on file  Social History Narrative  . Not on file   Social Determinants of Health   Financial Resource Strain:   . Difficulty of Paying Living Expenses: Not on file  Food Insecurity:   . Worried About Programme researcher, broadcasting/film/video in the Last Year: Not on file  . Ran Out of Food in the Last Year: Not on file  Transportation Needs:   . Lack of Transportation (Medical): Not on file  . Lack of Transportation (Non-Medical): Not on file  Physical Activity:   .  Days of Exercise per Week: Not on file  . Minutes of Exercise per Session: Not on file  Stress:   . Feeling of Stress : Not on file  Social Connections:   . Frequency of Communication with Friends and Family: Not on file  . Frequency of Social Gatherings with Friends and Family: Not on file  . Attends Religious Services: Not on file  . Active Member of Clubs or Organizations: Not on file  . Attends Banker Meetings: Not on file  . Marital Status: Not on file     Family History:  The patient's ***family history includes Hypertension in his father.   ROS:     Please see the history of present illness.    ROS All other systems reviewed and are negative.   PHYSICAL EXAM:   VS:  There were no vitals taken for this visit.  Physical Exam  GEN: Well nourished, well developed, in no acute distress  HEENT: normal  Neck: no JVD, carotid bruits, or masses Cardiac:RRR; no murmurs, rubs, or gallops  Respiratory:  clear to auscultation bilaterally, normal work of breathing GI: soft, nontender, nondistended, + BS Ext: without cyanosis, clubbing, or edema, Good distal pulses bilaterally MS: no deformity or atrophy  Skin: warm and dry, no rash Neuro:  Alert and Oriented x 3, Strength and sensation are intact Psych: euthymic mood, full affect  Wt Readings from Last 3 Encounters:  03/16/19 185 lb 3.2 oz (84 kg)  12/24/18 186 lb (84.4 kg)  12/14/18 183 lb 12.8 oz (83.4 kg)      Studies/Labs Reviewed:   EKG:  EKG is*** ordered today.  The ekg ordered today demonstrates ***  Recent Labs: No results found for requested labs within last 8760 hours.   Lipid Panel    Component Value Date/Time   CHOL 116 01/25/2019 0803   TRIG 132 01/25/2019 0803   HDL 37 (L) 01/25/2019 0803   CHOLHDL 3.1 01/25/2019 0803   LDLCALC 56 01/25/2019 0803    Additional studies/ records that were reviewed today include:  Echo 11/22/18:  1. The left ventricle has normal systolic function with an ejection fraction of 60-65%. The cavity size was normal. Left ventricular diastolic parameters were normal.  2. The right ventricle has normal systolic function. The cavity was normal. There is no increase   Cardiac cath 11/21/18:  Mid RCA lesion is 40% stenosed.  Prox Cx lesion is 99% stenosed.  1st Mrg lesion is 99% stenosed.  2nd Diag lesion is 90% stenosed.  Ost LM to Dist LM lesion is 50% stenosed.  Prox RCA lesion is 50% stenosed.  There is mild left ventricular systolic dysfunction.  LV end diastolic pressure is normal.  The left ventricular ejection  fraction is 45-50% by visual estimate.  There is no mitral valve regurgitation.  Mid LAD lesion is 95% stenosed.   1. Moderate distal left main stenosis 2. Severe mid LAD stenosis 3. Severe mid Circumflex stenosis involving the ostium of a very large obtuse marginal branch 4. Moderate mid RCA stenosis 5. Mild segmental LV systolic dysfunction (LVEF=45-50%).  6. Post cardiac arrest (ventricular fibrillation) 7. Rapid atrial fibrillation       ASSESSMENT:    No diagnosis found.   PLAN:  In order of problems listed above:  1. CAD s/p CABG: He underwent CABG July 2020. He is doing well. Will continue ASA, Plavix, statin and beta blocker.    2. Atrial fibrillation, paroxysmal: Atrial fib following cardiac arrest in  July 202 prior to CABG. Left atrial appendage clipping July 2020. No recurrence of atrial fib noted on 30 day event monitor August 2020. Continue beta blocker.    3. HTN: BP controlled.    4. Tobacco abuse: Smoking cessation advised. Will try Chantix.        Medication Adjustments/Labs and Tests Ordered: Current medicines are reviewed at length with the patient today.  Concerns regarding medicines are outlined above.  Medication changes, Labs and Tests ordered today are listed in the Patient Instructions below. There are no Patient Instructions on file for this visit.   Elson Clan, PA-C  02/08/2020 3:56 PM    [facility] [address] [place], [place], [place]  [address] Phone: [phone]; Fax: [phone]

## 2020-02-08 NOTE — Telephone Encounter (Signed)
*  STAT* If patient is at the pharmacy, call can be transferred to refill team.   1. Which medications need to be refilled? (please list name of each medication and dose if known) pantoprazole, metoprolol,   2. Which pharmacy/location (including street and city if local pharmacy) is medication to be sent to? [facility]   3. Do they need a 30 day or 90 day supply? Not sure

## 2020-02-08 NOTE — Telephone Encounter (Signed)
Pt's medication was sent to pt's pharmacy as requested. Confirmation received.  °

## 2020-02-09 LAB — NOVEL CORONAVIRUS, NAA: SARS-CoV-2, NAA: NOT DETECTED

## 2020-02-09 LAB — SARS-COV-2, NAA 2 DAY TAT

## 2020-02-10 ENCOUNTER — Telehealth: Payer: Self-pay | Admitting: *Deleted

## 2020-02-10 NOTE — Telephone Encounter (Signed)
Pt is aware covid 19 test is neg on 02/10/2020 

## 2020-02-14 ENCOUNTER — Ambulatory Visit: Payer: Medicaid Other | Admitting: Physician Assistant

## 2020-02-28 DIAGNOSIS — F410 Panic disorder [episodic paroxysmal anxiety] without agoraphobia: Secondary | ICD-10-CM | POA: Diagnosis not present

## 2020-02-28 DIAGNOSIS — M25562 Pain in left knee: Secondary | ICD-10-CM | POA: Diagnosis not present

## 2020-02-28 DIAGNOSIS — M25561 Pain in right knee: Secondary | ICD-10-CM | POA: Diagnosis not present

## 2020-02-28 DIAGNOSIS — E785 Hyperlipidemia, unspecified: Secondary | ICD-10-CM | POA: Diagnosis not present

## 2020-02-28 DIAGNOSIS — F419 Anxiety disorder, unspecified: Secondary | ICD-10-CM | POA: Diagnosis not present

## 2020-04-17 ENCOUNTER — Other Ambulatory Visit: Payer: Self-pay | Admitting: *Deleted

## 2020-04-17 ENCOUNTER — Telehealth: Payer: Self-pay | Admitting: Cardiovascular Disease

## 2020-04-17 MED ORDER — PANTOPRAZOLE SODIUM 40 MG PO TBEC
40.0000 mg | DELAYED_RELEASE_TABLET | Freq: Every day | ORAL | 1 refills | Status: DC
Start: 2020-04-17 — End: 2020-05-16

## 2020-04-17 MED ORDER — CLOPIDOGREL BISULFATE 75 MG PO TABS
75.0000 mg | ORAL_TABLET | Freq: Every day | ORAL | 1 refills | Status: DC
Start: 2020-04-17 — End: 2020-06-04

## 2020-04-17 MED ORDER — ATORVASTATIN CALCIUM 80 MG PO TABS
80.0000 mg | ORAL_TABLET | Freq: Every day | ORAL | 1 refills | Status: DC
Start: 2020-04-17 — End: 2020-05-16

## 2020-04-17 NOTE — Telephone Encounter (Signed)
°*  STAT* If patient is at the pharmacy, call can be transferred to refill team.   1. Which medications need to be refilled? (please list name of each medication and dose if known)   clopidogrel (PLAVIX) 75 MG tablet [[id number] atorvastatin (LIPITOR) 80 MG tablet [[id number]]  pantoprazole (PROTONIX) 40 MG tablet [[id number]]    2. Which pharmacy/location (including street and city if local pharmacy) is medication to be sent to?   [facility] [address] ([address] [place] - [address]  [address] [place] ([place]) [place] [address]  Phone:  [phone] Fax:  [phone]   3. Do they need a 30 day or 90 day supply? 90

## 2020-05-05 ENCOUNTER — Other Ambulatory Visit: Payer: Self-pay

## 2020-05-05 DIAGNOSIS — R110 Nausea: Secondary | ICD-10-CM | POA: Diagnosis not present

## 2020-05-05 DIAGNOSIS — U071 COVID-19: Secondary | ICD-10-CM | POA: Insufficient documentation

## 2020-05-05 DIAGNOSIS — Z5321 Procedure and treatment not carried out due to patient leaving prior to being seen by health care provider: Secondary | ICD-10-CM | POA: Diagnosis not present

## 2020-05-05 DIAGNOSIS — I219 Acute myocardial infarction, unspecified: Secondary | ICD-10-CM | POA: Diagnosis not present

## 2020-05-05 DIAGNOSIS — R0789 Other chest pain: Secondary | ICD-10-CM | POA: Diagnosis present

## 2020-05-05 DIAGNOSIS — R079 Chest pain, unspecified: Secondary | ICD-10-CM | POA: Diagnosis not present

## 2020-05-05 DIAGNOSIS — J9811 Atelectasis: Secondary | ICD-10-CM | POA: Diagnosis not present

## 2020-05-05 DIAGNOSIS — J90 Pleural effusion, not elsewhere classified: Secondary | ICD-10-CM | POA: Diagnosis not present

## 2020-05-06 ENCOUNTER — Emergency Department (HOSPITAL_COMMUNITY)
Admission: EM | Admit: 2020-05-06 | Discharge: 2020-05-06 | Disposition: A | Discharge status: Home / Self Care | Payer: Medicaid Other | Attending: Emergency Medicine | Admitting: Emergency Medicine

## 2020-05-06 ENCOUNTER — Emergency Department (HOSPITAL_COMMUNITY): Payer: Medicaid Other

## 2020-05-06 DIAGNOSIS — J9811 Atelectasis: Secondary | ICD-10-CM | POA: Diagnosis not present

## 2020-05-06 DIAGNOSIS — J90 Pleural effusion, not elsewhere classified: Secondary | ICD-10-CM | POA: Diagnosis not present

## 2020-05-06 DIAGNOSIS — R079 Chest pain, unspecified: Secondary | ICD-10-CM | POA: Diagnosis not present

## 2020-05-06 LAB — CBC
HCT: 47.5 % (ref 39.0–52.0)
Hemoglobin: 15.8 g/dL (ref 13.0–17.0)
MCH: 29.5 pg (ref 26.0–34.0)
MCHC: 33.3 g/dL (ref 30.0–36.0)
MCV: 88.6 fL (ref 80.0–100.0)
Platelets: 267 10*3/uL (ref 150–400)
RBC: 5.36 MIL/uL (ref 4.22–5.81)
RDW: 14.2 % (ref 11.5–15.5)
WBC: 8.6 10*3/uL (ref 4.0–10.5)
nRBC: 0 % (ref 0.0–0.2)

## 2020-05-06 LAB — COMPREHENSIVE METABOLIC PANEL
ALT: 33 U/L (ref 0–44)
AST: 29 U/L (ref 15–41)
Albumin: 3.9 g/dL (ref 3.5–5.0)
Alkaline Phosphatase: 82 U/L (ref 38–126)
Anion gap: 9 (ref 5–15)
BUN: 13 mg/dL (ref 6–20)
CO2: 22 mmol/L (ref 22–32)
Calcium: 9 mg/dL (ref 8.9–10.3)
Chloride: 103 mmol/L (ref 98–111)
Creatinine, Ser: 1.13 mg/dL (ref 0.61–1.24)
GFR, Estimated: >60 mL/min (ref >60)
Glucose, Bld: 104 mg/dL — ABNORMAL HIGH (ref 70–99)
Potassium: 4.4 mmol/L (ref 3.5–5.1)
Sodium: 134 mmol/L — ABNORMAL LOW (ref 135–145)
Total Bilirubin: 0.6 mg/dL (ref 0.3–1.2)
Total Protein: 7.3 g/dL (ref 6.5–8.1)

## 2020-05-06 LAB — TROPONIN I (HIGH SENSITIVITY)
Troponin I (High Sensitivity): <2 ng/L (ref <18)
Troponin I (High Sensitivity): 2 ng/L (ref <18)

## 2020-05-06 LAB — RESP PANEL BY RT-PCR (FLU A&B, COVID) ARPGX2
Influenza A by PCR: NEGATIVE
Influenza B by PCR: NEGATIVE
SARS Coronavirus 2 by RT PCR: POSITIVE — AB

## 2020-05-06 MED ORDER — ACETAMINOPHEN 325 MG PO TABS: 650.0000 mg | ORAL_TABLET | Freq: Once | ORAL | Status: DC

## 2020-05-06 NOTE — ED Triage Notes (Signed)
Pt initially came in with main c/o chest pain, but with speaking to pt he states that he is having R sided chest pain, a headache and nausea. Pt has fever of 100.24f. Pt does have hx of MI.

## 2020-05-06 NOTE — ED Notes (Signed)
Pt was feeling faint and had to get out of the wheelchair to sit on the ground. Vital signs were assessed and the delay for a room was explained to the patient.

## 2020-05-07 ENCOUNTER — Telehealth: Payer: Self-pay

## 2020-05-07 NOTE — Telephone Encounter (Signed)
Attempted to reach pt. In regard to further treatment for COVID 19. Message left with Infusion hotline number [phone].

## 2020-05-15 ENCOUNTER — Telehealth: Payer: Self-pay | Admitting: Cardiovascular Disease

## 2020-05-15 NOTE — Telephone Encounter (Signed)
°*  STAT* If patient is at the pharmacy, call can be transferred to refill team.   1. Which medications need to be refilled? (please list name of each medication and dose if known)  atorvastatin (LIPITOR) 80 MG tablet pantoprazole (PROTONIX) 40 MG tablet FLUoxetine (PROZAC) 10 MG tablet   2. Which pharmacy/location (including street and city if local pharmacy) is medication to be sent to? [id number] - [place] ([place]), [place] - [address]  3. Do they need a 30 day or 90 day supply? 90    Patient is out of all of his mediations

## 2020-05-16 MED ORDER — ATORVASTATIN CALCIUM 80 MG PO TABS: 80.0000 mg | ORAL_TABLET | Freq: Every day | ORAL | 0 refills | Status: DC

## 2020-05-16 MED ORDER — PANTOPRAZOLE SODIUM 40 MG PO TBEC: 40.0000 mg | DELAYED_RELEASE_TABLET | Freq: Every day | ORAL | 0 refills | Status: DC

## 2020-05-16 NOTE — Telephone Encounter (Signed)
Pt's medication was sent to pt's pharmacy as requested. Confirmation received.  °

## 2020-06-03 ENCOUNTER — Other Ambulatory Visit: Payer: Self-pay | Admitting: Cardiovascular Disease

## 2020-06-10 ENCOUNTER — Other Ambulatory Visit: Payer: Self-pay | Admitting: Cardiovascular Disease

## 2020-06-18 ENCOUNTER — Ambulatory Visit: Payer: Medicaid Other | Admitting: Cardiovascular Disease

## 2020-06-18 ENCOUNTER — Other Ambulatory Visit: Payer: Self-pay

## 2020-06-18 ENCOUNTER — Encounter: Payer: Self-pay | Admitting: Cardiovascular Disease

## 2020-06-18 VITALS — BP 126/80 | HR 89 | Ht 70.0 in | Wt 206.4 lb

## 2020-06-18 DIAGNOSIS — I10 Essential (primary) hypertension: Secondary | ICD-10-CM

## 2020-06-18 DIAGNOSIS — I2510 Atherosclerotic heart disease of native coronary artery without angina pectoris: Secondary | ICD-10-CM

## 2020-06-18 DIAGNOSIS — I480 Paroxysmal atrial fibrillation: Secondary | ICD-10-CM

## 2020-06-18 DIAGNOSIS — E7800 Pure hypercholesterolemia, unspecified: Secondary | ICD-10-CM

## 2020-06-18 LAB — BASIC METABOLIC PANEL
BUN/Creatinine Ratio: 13 (ref 9–20)
BUN: 14 mg/dL (ref 6–24)
CO2: 25 mmol/L (ref 20–29)
Calcium: 9.2 mg/dL (ref 8.7–10.2)
Chloride: 100 mmol/L (ref 96–106)
Creatinine, Ser: 1.1 mg/dL (ref 0.76–1.27)
GFR calc Af Amer: 90 mL/min/{1.73_m2} (ref >59)
GFR calc non Af Amer: 78 mL/min/{1.73_m2} (ref >59)
Glucose: 95 mg/dL (ref 65–99)
Potassium: 4.4 mmol/L (ref 3.5–5.2)
Sodium: 140 mmol/L (ref 134–144)

## 2020-06-18 LAB — HEPATIC FUNCTION PANEL
ALT: 19 IU/L (ref 0–44)
AST: 15 IU/L (ref 0–40)
Albumin: 4.4 g/dL (ref 4.0–5.0)
Alkaline Phosphatase: 100 IU/L (ref 44–121)
Bilirubin Total: 0.4 mg/dL (ref 0.0–1.2)
Bilirubin, Direct: 0.13 mg/dL (ref 0.00–0.40)
Total Protein: 6.6 g/dL (ref 6.0–8.5)

## 2020-06-18 LAB — LIPID PANEL
Chol/HDL Ratio: 5.5 ratio — ABNORMAL HIGH (ref 0.0–5.0)
Cholesterol, Total: 214 mg/dL — ABNORMAL HIGH (ref 100–199)
HDL: 39 mg/dL — ABNORMAL LOW (ref >39)
LDL Chol Calc (NIH): 138 mg/dL — ABNORMAL HIGH (ref 0–99)
Triglycerides: 204 mg/dL — ABNORMAL HIGH (ref 0–149)
VLDL Cholesterol Cal: 37 mg/dL (ref 5–40)

## 2020-06-18 MED ORDER — ATORVASTATIN CALCIUM 80 MG PO TABS: 80.0000 mg | ORAL_TABLET | Freq: Every day | ORAL | 3 refills | Status: DC

## 2020-06-18 NOTE — Patient Instructions (Signed)
Medication Instructions:  Your physician has recommended you make the following change in your medication:   STOP taking Clopidogrel (PLAVIX) STOP taking metoprolol    *If you need a refill on your cardiac medications before your next appointment, please call your pharmacy*   Lab Work: Today: BMET, LFTs, LIPIDS If you have labs (blood work) drawn today and your tests are completely normal, you will receive your results only by: Marland Kitchen MyChart Message (if you have MyChart) OR . A paper copy in the mail If you have any lab test that is abnormal or we need to change your treatment, we will call you to review the results.   Testing/Procedures: none   Follow-Up: At [facility], you and your health needs are our priority.  As part of our continuing mission to provide you with exceptional heart care, we have created designated Provider Care Teams.  These Care Teams include your primary Cardiologist (physician) and Advanced Practice Providers (APPs -  Physician Assistants and Nurse Practitioners) who all work together to provide you with the care you need, when you need it.  Your next appointment:   1 year(s)  The format for your next appointment:   In Person  Provider:   You may see Verne Carrow, MD or one of the following Advanced Practice Providers on your designated Care Team:    Ronie Spies, PA-C  Jacolyn Reedy, PA-C

## 2020-06-18 NOTE — Progress Notes (Signed)
Chief Complaint  Patient presents with  . Follow-up    CAD    History of Present Illness: 50 yo male with history of CAD s/p CABG following cardiac arrest July 2020, PAF, hyperlipidemia and tobacco abuse who is here today for cardiac follow up. He was admitted to [facility] 11/21/18 following a cardiac arrest. He was found to have distal left main and triple vessel CAD and underwent 3V CABG on 11/25/18 (LIMA to LAD, SVG to DIag, SVG to OM) with left atrial clip placement. Echo July 2020 with LVEF=60-65% and no valve disease. Cardiac monitor August 2020 with sinus, no arrhythmias. He works at [employer].   He is here today for follow up. The patient denies any chest pain, dyspnea, palpitations, lower extremity edema, orthopnea, PND, dizziness, near syncope or syncope. He feels fatigued and becomes dizzy when he really pushes himself with extreme exertion. No syncope. He thinks the beta blocker may be making him feel poorly.   Primary Care Physician: [facility]   Past Medical History:  Diagnosis Date  . Anxiety 11/23/2018  . CAD (coronary artery disease)    S/P cardiac arrest 11/21/18, Cath-MVD, 11/25/18-3V CABG and LA clipping  . Hyperlipidemia LDL goal <70   . Restless leg   . Tobacco abuse     Past Surgical History:  Procedure Laterality Date  . CLIPPING OF ATRIAL APPENDAGE N/A 11/25/2018   Procedure: CLIPPING OF ATRIAL APPENDAGE;  Surgeon: Corliss Skains, MD;  Location: [facility] OR;  Service: Open Heart Surgery;  Laterality: N/A;  FLOW TRACK  . CORONARY ARTERY BYPASS GRAFT N/A 11/25/2018   Procedure: CORONARY ARTERY BYPASS GRAFTING (CABG) times three, using left mammary artery and right greater saphenous vein harvested endoscopically;  Surgeon: Corliss Skains, MD;  Location: [facility] OR;  Service: Open Heart Surgery;  Laterality: N/A;  . LEFT HEART CATH AND CORONARY ANGIOGRAPHY N/A 11/21/2018   Procedure: LEFT HEART CATH AND CORONARY ANGIOGRAPHY;  Surgeon: Kathleene Hazel, MD;  Location: [facility] INVASIVE CV LAB;  Service: Cardiovascular;  Laterality: N/A;  . TEE WITHOUT CARDIOVERSION N/A 11/25/2018   Procedure: TRANSESOPHAGEAL ECHOCARDIOGRAM (TEE);  Surgeon: Corliss Skains, MD;  Location: [facility] OR;  Service: Open Heart Surgery;  Laterality: N/A;    Current Outpatient Medications  Medication Sig Dispense Refill  . acetaminophen (TYLENOL) 500 MG tablet Take 1,000 mg by mouth every 6 (six) hours as needed for mild pain or headache.    Marland Kitchen aspirin EC 81 MG tablet Take 1 tablet (81 mg total) by mouth daily.    Marland Kitchen FLUoxetine (PROZAC) 40 MG capsule Take 40 mg by mouth daily.    . Multiple Vitamin (MULTIVITAMIN) tablet Take 1 tablet by mouth daily.    . NON FORMULARY Take 1 tablet by mouth as needed. Hylands Restless leg tablets    . omega-3 acid ethyl esters (LOVAZA) 1 g capsule Take 1 g by mouth daily.    . pantoprazole (PROTONIX) 40 MG tablet Take 1 tablet (40 mg total) by mouth daily. Please keep upcoming appointment in Feb 2022 for future refills. 2nd attempt. Thank you 30 tablet 0  . atorvastatin (LIPITOR) 80 MG tablet Take 1 tablet (80 mg total) by mouth daily. 90 tablet 3  . gabapentin (NEURONTIN) 300 MG capsule Take 300 mg by mouth as needed.     No current facility-administered medications for this visit.    No Known Allergies  Social History   Socioeconomic History  . Marital status: Single  Spouse name: Not on file  . Number of children: Not on file  . Years of education: Not on file  . Highest education level: Not on file  Occupational History  . Not on file  Tobacco Use  . Smoking status: Current Every Day Smoker    Packs/day: 1.00    Types: Cigarettes  . Smokeless tobacco: Never Used  Substance and Sexual Activity  . Alcohol use: Not Currently  . Drug use: Never  . Sexual activity: Not on file  Other Topics Concern  . Not on file  Social History Narrative  . Not on file   Social Determinants of Health   Financial  Resource Strain: Not on file  Food Insecurity: Not on file  Transportation Needs: Not on file  Physical Activity: Not on file  Stress: Not on file  Social Connections: Not on file  Intimate Partner Violence: Not on file    Family History  Problem Relation Age of Onset  . Hypertension Father     Review of Systems:  As stated in the HPI and otherwise negative.   BP 126/80   Pulse 89   Ht 5\' 10"  (1.778 m)   Wt 206 lb 6.4 oz (93.6 kg)   SpO2 96%   BMI 29.62 kg/m   Physical Examination: General: Well developed, well nourished, NAD  HEENT: OP clear, mucus membranes moist  SKIN: warm, dry. No rashes. Neuro: No focal deficits  Musculoskeletal: Muscle strength 5/5 all ext  Psychiatric: Mood and affect normal  Neck: No JVD, no carotid bruits, no thyromegaly, no lymphadenopathy.  Lungs:Clear bilaterally, no wheezes, rhonci, crackles Cardiovascular: Regular rate and rhythm. No murmurs, gallops or rubs. Abdomen:Soft. Bowel sounds present. Non-tender.  Extremities: No lower extremity edema. Pulses are 2 + in the bilateral DP/PT.  EKG:  EKG is not  ordered today. The ekg ordered today demonstrates   Echo 11/22/18:  1. The left ventricle has normal systolic function with an ejection fraction of 60-65%. The cavity size was normal. Left ventricular diastolic parameters were normal.  2. The right ventricle has normal systolic function. The cavity was normal. There is no increase  Cardiac cath 11/21/18:  Mid RCA lesion is 40% stenosed.  Prox Cx lesion is 99% stenosed.  1st Mrg lesion is 99% stenosed.  2nd Diag lesion is 90% stenosed.  Ost LM to Dist LM lesion is 50% stenosed.  Prox RCA lesion is 50% stenosed.  There is mild left ventricular systolic dysfunction.  LV end diastolic pressure is normal.  The left ventricular ejection fraction is 45-50% by visual estimate.  There is no mitral valve regurgitation.  Mid LAD lesion is 95% stenosed.   1. Moderate distal left main  stenosis 2. Severe mid LAD stenosis 3. Severe mid Circumflex stenosis involving the ostium of a very large obtuse marginal branch 4. Moderate mid RCA stenosis 5. Mild segmental LV systolic dysfunction (LVEF=45-50%).  6. Post cardiac arrest (ventricular fibrillation) 7. Rapid atrial fibrillation  Recent Labs: 05/06/2020: ALT 33; BUN 13; Creatinine, Ser 1.13; Hemoglobin 15.8; Platelets 267; Potassium 4.4; Sodium 134   Lipid Panel    Component Value Date/Time   CHOL 116 01/25/2019 0803   TRIG 132 01/25/2019 0803   HDL 37 (L) 01/25/2019 0803   CHOLHDL 3.1 01/25/2019 0803   LDLCALC 56 01/25/2019 0803     Wt Readings from Last 3 Encounters:  06/18/20 206 lb 6.4 oz (93.6 kg)  05/05/20 185 lb (83.9 kg)  03/16/19 185 lb 3.2 oz (84  kg)     Assessment and Plan:   1. CAD s/p CABG: He underwent CABG July 2020. No chest pain. Continue ASA, statin and beta blocker. Will stop Plavix.  Will stop metoprolol given fatigue. BMET today  2. Atrial fibrillation, paroxysmal: Atrial fib following cardiac arrest in July 2020 prior to CABG. Left atrial appendage clipping July 2020. No recurrence of atrial fib noted on 30 day event monitor August 2020. He does not wish to keep taking the beta blocker due to fatigue.   3. HTN: BP is well controlled.   4. Tobacco abuse: Smoking cessation advised.   5. Hyperlipidemia: Will check lipids and LFTs today. Continue statin  Current medicines are reviewed at length with the patient today.  The patient does not have concerns regarding medicines.  The following changes have been made:  no change  Labs/ tests ordered today include:   Orders Placed This Encounter  Procedures  . Basic metabolic panel  . Hepatic function panel  . Lipid panel     Disposition:   FU with me in 12 months.    Signed, Verne Carrow, MD 06/18/2020 10:33 AM    [facility] [address] [place], [place], [place]  [address] Phone: [phone]; Fax: [phone]

## 2020-06-19 ENCOUNTER — Telehealth: Payer: Self-pay | Admitting: Cardiovascular Disease

## 2020-06-19 DIAGNOSIS — E7800 Pure hypercholesterolemia, unspecified: Secondary | ICD-10-CM

## 2020-06-19 DIAGNOSIS — I2510 Atherosclerotic heart disease of native coronary artery without angina pectoris: Secondary | ICD-10-CM

## 2020-06-19 NOTE — Telephone Encounter (Signed)
    [name] is returning call to get pt's lab result

## 2020-06-20 ENCOUNTER — Other Ambulatory Visit: Payer: Self-pay | Admitting: Cardiovascular Disease

## 2020-06-20 MED ORDER — ROSUVASTATIN CALCIUM 40 MG PO TABS: 40.0000 mg | ORAL_TABLET | Freq: Every day | ORAL | 3 refills | Status: DC

## 2020-06-20 NOTE — Telephone Encounter (Signed)
During the previous call George Munoz had to hang up so I called her back at this time.  Got VM.  Left detailed message of all that we discussed as well as the appointment on 5/18 to return for fasting L/L.

## 2020-06-20 NOTE — Telephone Encounter (Signed)
-----   Message from Kathleene Hazel, MD sent at 06/19/2020  9:58 AM EST ----- His LDL is double what it was a year ago. It looks like he has not been taking Lipitor 80 mg consistently. Can we let him know that his LFTs are normal and renal function/electrolytes are normal. If he has not been doing so, will need to take Lipitor 80 mg daily and repeat lipids/LFTS in 12 weeks. If he has been taking Lipitor 80 mg daily every day, will need to consider changing the Lipitor to Crestor 40 mg daily for better control. Thayer Ohm

## 2020-06-20 NOTE — Telephone Encounter (Signed)
I spoke w Virl Cagey, (DPR), reviewed results.  She reports pt is taking lipitor 80 mg daily.  I have adv that Dr. Clifton James recommends he switch to Crestor 40 mg daily. Repeat Lipids and LFTs in 12 weeks.  Lab appointment scheduled for 09/12/20.

## 2020-07-02 ENCOUNTER — Other Ambulatory Visit: Payer: Self-pay | Admitting: Cardiovascular Disease

## 2020-07-26 ENCOUNTER — Encounter: Payer: Self-pay | Admitting: *Deleted

## 2020-07-26 ENCOUNTER — Telehealth: Payer: Self-pay | Admitting: Cardiovascular Disease

## 2020-07-26 DIAGNOSIS — N492 Inflammatory disorders of scrotum: Secondary | ICD-10-CM | POA: Diagnosis not present

## 2020-07-26 NOTE — Telephone Encounter (Signed)
Spoke with patient and [name]. Letter written and placed at [employer].  He will pick up tomorrow.

## 2020-07-26 NOTE — Telephone Encounter (Signed)
    Pt's wife calling, she said pt had a new job and they are asking for a new return to work letter from Dr. Clifton James, also indicating that pt allowed to lift 50 lbs

## 2020-07-30 DIAGNOSIS — N503 Cyst of epididymis: Secondary | ICD-10-CM | POA: Diagnosis not present

## 2020-07-30 DIAGNOSIS — I861 Scrotal varices: Secondary | ICD-10-CM | POA: Diagnosis not present

## 2020-07-30 DIAGNOSIS — N433 Hydrocele, unspecified: Secondary | ICD-10-CM | POA: Diagnosis not present

## 2020-07-30 DIAGNOSIS — N5089 Other specified disorders of the male genital organs: Secondary | ICD-10-CM | POA: Diagnosis not present

## 2020-07-31 ENCOUNTER — Encounter: Payer: Self-pay | Admitting: Urology

## 2020-07-31 ENCOUNTER — Ambulatory Visit (INDEPENDENT_AMBULATORY_CARE_PROVIDER_SITE_OTHER): Payer: Medicaid Other | Admitting: Urology

## 2020-07-31 ENCOUNTER — Other Ambulatory Visit: Payer: Self-pay

## 2020-07-31 DIAGNOSIS — R3129 Other microscopic hematuria: Secondary | ICD-10-CM | POA: Diagnosis not present

## 2020-07-31 DIAGNOSIS — N433 Hydrocele, unspecified: Secondary | ICD-10-CM

## 2020-07-31 DIAGNOSIS — R319 Hematuria, unspecified: Secondary | ICD-10-CM

## 2020-07-31 LAB — URINALYSIS, COMPLETE
Bilirubin, UA: NEGATIVE
Glucose, UA: NEGATIVE
Ketones, UA: NEGATIVE
Leukocytes,UA: NEGATIVE
Nitrite, UA: NEGATIVE
Specific Gravity, UA: >1.03 — ABNORMAL HIGH (ref 1.005–1.030)
Urobilinogen, Ur: 1 mg/dL (ref 0.2–1.0)
pH, UA: 5.5 (ref 5.0–7.5)

## 2020-07-31 LAB — MICROSCOPIC EXAMINATION

## 2020-07-31 NOTE — Progress Notes (Signed)
07/31/2020 7:30 AM   George Munoz 12/07/1970 [id number]  Referring provider: Dema Severin, NP [address] [place],  [place] [address]  Chief Complaint  Patient presents with  . Hematuria  . Groin Swelling    New Patient    HPI: George Munoz is a 50 y.o. male referred for evaluation of hematuria and scrotal swelling   Reason DOT physical with findings of hematuria and left hemiscrotal swelling  No bothersome LUTS  Denies gross hematuria  Unknown if UA result was dipstick or complete urinalysis  Significant tobacco history; presently smoking 0.75 packs/day; has smoked 1-1.5 pack/day x 34 years  Denies flank, abdominal or pelvic pain   Left hemiscrotal swelling present x10 years  No pain or discomfort  Had scrotal ultrasound performed at [facility] yesterday, no results available   PMH: Past Medical History:  Diagnosis Date  . Anxiety 11/23/2018  . CAD (coronary artery disease)    S/P cardiac arrest 11/21/18, Cath-MVD, 11/25/18-3V CABG and LA clipping  . Hyperlipidemia LDL goal <70   . Restless leg   . Tobacco abuse     Surgical History: Past Surgical History:  Procedure Laterality Date  . CLIPPING OF ATRIAL APPENDAGE N/A 11/25/2018   Procedure: CLIPPING OF ATRIAL APPENDAGE;  Surgeon: Corliss Skains, MD;  Location: [facility] OR;  Service: Open Heart Surgery;  Laterality: N/A;  FLOW TRACK  . CORONARY ARTERY BYPASS GRAFT N/A 11/25/2018   Procedure: CORONARY ARTERY BYPASS GRAFTING (CABG) times three, using left mammary artery and right greater saphenous vein harvested endoscopically;  Surgeon: Corliss Skains, MD;  Location: [facility] OR;  Service: Open Heart Surgery;  Laterality: N/A;  . LEFT HEART CATH AND CORONARY ANGIOGRAPHY N/A 11/21/2018   Procedure: LEFT HEART CATH AND CORONARY ANGIOGRAPHY;  Surgeon: Kathleene Hazel, MD;  Location: [facility] INVASIVE CV LAB;  Service: Cardiovascular;  Laterality: N/A;  . TEE WITHOUT CARDIOVERSION N/A 11/25/2018    Procedure: TRANSESOPHAGEAL ECHOCARDIOGRAM (TEE);  Surgeon: Corliss Skains, MD;  Location[facility];  Service: Open Heart Surgery;  Laterality: N/A;    Home Medications:  Allergies as of 07/31/2020   No Known Allergies     Medication List       Accurate as of July 31, 2020 11:59 PM. If you have any questions, ask your nurse or doctor.        STOP taking these medications   acetaminophen 500 MG tablet Commonly known as: TYLENOL Stopped by: Riki Altes, MD   NON FORMULARY Stopped by: Riki Altes, MD     TAKE these medications   aspirin EC 81 MG tablet Take 1 tablet (81 mg total) by mouth daily.   FLUoxetine 40 MG capsule Commonly known as: PROZAC Take 40 mg by mouth daily.   gabapentin 300 MG capsule Commonly known as: NEURONTIN Take 300 mg by mouth as needed.   multivitamin tablet Take 1 tablet by mouth daily.   omega-3 acid ethyl esters 1 g capsule Commonly known as: LOVAZA Take 1 g by mouth daily.   pantoprazole 40 MG tablet Commonly known as: PROTONIX TAKE 1 TABLET BY MOUTH EVERY DAY (PLS KEEP APPT FOR FEB 2022 FOR FUTURE REFILLS)   rosuvastatin 40 MG tablet Commonly known as: CRESTOR Take 1 tablet (40 mg total) by mouth daily.       Allergies: No Known Allergies  Family History: Family History  Problem Relation Age of Onset  . Hypertension Father   . Prostate cancer Neg Hx   .  Bladder Cancer Neg Hx   . Kidney cancer Neg Hx     Social History:  reports that he has been smoking cigarettes. He has been smoking about 0.75 packs per day. He has never used smokeless tobacco. He reports previous alcohol use. He reports that he does not use drugs.   Physical Exam: Constitutional:  Alert and oriented, No acute distress. HEENT: Needles AT, moist mucus membranes.  Trachea midline, no masses. Cardiovascular: No clubbing, cyanosis, or edema. Respiratory: Normal respiratory effort, no increased work of breathing. GU: Large left hemiscrotal mass  consistent with hydrocele.  Left testis not palpable.  Due to mass size some penile retraction and displacement right testis. Skin: No rashes, bruises or suspicious lesions. Neurologic: Grossly intact, no focal deficits, moving all 4 extremities. Psychiatric: Normal mood and affect.  Laboratory Data:  Urinalysis Dipstick 2+ blood Microscopy 3-10 RBC  Assessment & Plan:    1. Microscopic hematuria  Based on tobacco history AUA hematuria risk stratification: High  We discussed the recommended evaluation of high risk hematuria to include CT urogram and cystoscopy  Potential causes of microhematuria were discussed including inflammation, urinary tract calculi and urinary tract malignancy  CT urogram ordered and he was scheduled for cystoscopy/CT follow-up  2.  Left hydrocele  Large hydrocele but asymptomatic  Scrotal ultrasound has been performed and will request results  We discussed the other possibility of a inguinal hernia   Riki Altes, MD  [facility] [address] [place], [place] [address] [phone]

## 2020-07-31 NOTE — Patient Instructions (Addendum)
Cystoscopy Cystoscopy is a procedure that is used to help diagnose and sometimes treat conditions that affect the lower urinary tract. The lower urinary tract includes the bladder and the urethra. The urethra is the tube that drains urine from the bladder. Cystoscopy is done using a thin, tube-shaped instrument with a light and camera at the end (cystoscope). The cystoscope may be hard or flexible, depending on the goal of the procedure. The cystoscope is inserted through the urethra, into the bladder. Cystoscopy may be recommended if you have:  Urinary tract infections that keep coming back.  Blood in the urine (hematuria).  An inability to control when you urinate (urinary incontinence) or an overactive bladder.  Unusual cells found in a urine sample.  A blockage in the urethra, such as a urinary stone.  Painful urination.  An abnormality in the bladder found during an intravenous pyelogram (IVP) or CT scan. Cystoscopy may also be done to remove a sample of tissue to be examined under a microscope (biopsy). What are the risks? Generally, this is a safe procedure. However, problems may occur, including:  Infection.  Bleeding.  What happens during the procedure?  1. You will be given one or more of the following: ? A medicine to numb the area (local anesthetic). 2. The area around the opening of your urethra will be cleaned. 3. The cystoscope will be passed through your urethra into your bladder. 4. Germ-free (sterile) fluid will flow through the cystoscope to fill your bladder. The fluid will stretch your bladder so that your health care provider can clearly examine your bladder walls. 5. Your doctor will look at the urethra and bladder. 6. The cystoscope will be removed The procedure may vary among health care providers  What can I expect after the procedure? After the procedure, it is common to have: 1. Some soreness or pain in your abdomen and urethra. 2. Urinary symptoms.  These include: ? Mild pain or burning when you urinate. Pain should stop within a few minutes after you urinate. This may last for up to 1 week. ? A small amount of blood in your urine for several days. ? Feeling like you need to urinate but producing only a small amount of urine. Follow these instructions at home: General instructions  Return to your normal activities as told by your health care provider.   Do not drive for 24 hours if you were given a sedative during your procedure.  Watch for any blood in your urine. If the amount of blood in your urine increases, call your health care provider.  If a tissue sample was removed for testing (biopsy) during your procedure, it is up to you to get your test results. Ask your health care provider, or the department that is doing the test, when your results will be ready.  Drink enough fluid to keep your urine pale yellow.  Keep all follow-up visits as told by your health care provider. This is important. Contact a health care provider if you:  Have pain that gets worse or does not get better with medicine, especially pain when you urinate.  Have trouble urinating.  Have more blood in your urine. Get help right away if you:  Have blood clots in your urine.  Have abdominal pain.  Have a fever or chills.  Are unable to urinate. Summary  Cystoscopy is a procedure that is used to help diagnose and sometimes treat conditions that affect the lower urinary tract.  Cystoscopy is done using   a thin, tube-shaped instrument with a light and camera at the end.  After the procedure, it is common to have some soreness or pain in your abdomen and urethra.  Watch for any blood in your urine. If the amount of blood in your urine increases, call your health care provider.  If you were prescribed an antibiotic medicine, take it as told by your health care provider. Do not stop taking the antibiotic even if you start to feel better. This  information is not intended to replace advice given to you by your health care provider. Make sure you discuss any questions you have with your health care provider. Document Revised: 04/06/2018 Document Reviewed: 04/06/2018 Elsevier Patient Education  2020 [employer]  Hydrocele, Adult A hydrocele is a collection of fluid in the loose pouch of skin that holds the testicles (scrotum). This may happen because:  The amount of fluid produced in the scrotum is not absorbed by the rest of the body.  Fluid from the abdomen fills the scrotum. Normally, the testicles develop in the abdomen then drop into to the scrotum before birth. The tube that the testicles travel through usually closes after the testicles drop. If the tube does not close, fluid from the abdomen can fill the scrotum. This is not very common in adults. What are the causes? A hydrocele may be caused by:  An injury to the scrotum.  An infection.  Decreased blood flow to the scrotum.  Twisting of a testicle (testicular torsion).  A birth defect.  A tumor or cancer of the testicle. Sometimes, the cause is not known. What are the signs or symptoms? A hydrocele feels like a water-filled balloon. It may also feel heavy. Other symptoms include:  Swelling of the scrotum. The swelling may decrease when you lie down. You may also notice more swelling at night than in the morning. This is called a communicating hydrocele, in which the fluid in the scrotum goes back into the abdominal cavity when the position of the scrotum changes.  Swelling of the groin.  Mild discomfort in the scrotum.  Pain. This can develop if the hydrocele was caused by infection or twisting. The larger the hydrocele, the more likely you are to have pain. Swelling may also cause pain. How is this diagnosed? This condition may be diagnosed based on a physical exam and your medical history. You may also have tests, including:  Imaging tests, such as  ultrasound.  A transillumination test. This test takes place in a dark room; a light is placed on the skin of the scrotum. Clear liquid will not impede the light and the scrotum will be illuminated. This helps a health care provider distinguish a hydrocele from a tumor.  Blood or urine tests. How is this treated? Most hydroceles go away on their own. If you have no discomfort or pain, your health care provider may suggest close monitoring of your condition (called watch and wait or watchful waiting) until the condition goes away or symptoms develop. If treatment is needed, it may include:  Treating an underlying condition. This may include taking an antibiotic medicine to treat an infection.  Having surgery to stop fluid from collecting in the scrotum.  Having surgery to drain the fluid. Surgery may include: ? Hydrocelectomy. For this procedure, an incision is made in the scrotum to remove the fluid sac. ? Needle aspiration. A needle is used to drain fluid. However, the fluid buildup will come back quickly and may lead to  an infection of the scrotum. This treatment is rarely used. Follow these instructions at home: Medicines  Take over-the-counter and prescription medicines only as told by your health care provider.  If you were prescribed an antibiotic medicine, take it as told by your health care provider. Do not stop taking the antibiotic even if you start to feel better. General instructions  Watch the hydrocele for any changes.  Keep all follow-up visits as told by your health care provider. This is important. Contact a health care provider if:  You notice any changes in the hydrocele.  The swelling in your scrotum or groin gets worse.  The hydrocele becomes red, firm, painful, or tender to the touch.  You have a fever. Get help right away if you:  Develop a lot of pain or your pain becomes worse.  Have shaking chills.  Have a high fever. Summary  A hydrocele is a  collection of fluid in the loose pouch of skin that holds the testicles (scrotum).  A hydrocele can cause swelling, discomfort, and pain.  In adults, the cause of a hydrocele may not be known. However, it is sometimes caused by an infection or a rotation and twisting of the scrotum.  Treatment may not be needed. Hydroceles often go away on their own. If a hydrocele causes pain, treatment may be given to ease the pain. This information is not intended to replace advice given to you by your health care provider. Make sure you discuss any questions you have with your health care provider. Document Revised: 02/09/2019 Document Reviewed: 02/09/2019 Elsevier Patient Education  2021 [employer].

## 2020-08-01 ENCOUNTER — Encounter: Payer: Self-pay | Admitting: Urology

## 2020-08-07 ENCOUNTER — Ambulatory Visit
Admission: RE | Admit: 2020-08-07 | Discharge: 2020-08-07 | Disposition: A | Discharge status: Home / Self Care | Payer: Medicaid Other | Source: Ambulatory Visit | Attending: Urology | Admitting: Urology

## 2020-08-07 ENCOUNTER — Other Ambulatory Visit: Payer: Self-pay

## 2020-08-07 DIAGNOSIS — R3129 Other microscopic hematuria: Secondary | ICD-10-CM | POA: Diagnosis not present

## 2020-08-07 DIAGNOSIS — Z87438 Personal history of other diseases of male genital organs: Secondary | ICD-10-CM | POA: Diagnosis not present

## 2020-08-07 DIAGNOSIS — K4090 Unilateral inguinal hernia, without obstruction or gangrene, not specified as recurrent: Secondary | ICD-10-CM | POA: Diagnosis not present

## 2020-08-07 DIAGNOSIS — M7989 Other specified soft tissue disorders: Secondary | ICD-10-CM | POA: Diagnosis not present

## 2020-08-07 MED ORDER — IOHEXOL 300 MG/ML  SOLN
125.0000 mL | Freq: Once | INTRAMUSCULAR | Status: AC | PRN
  Administered 2020-08-07: 125 mL via INTRAVENOUS

## 2020-08-08 ENCOUNTER — Other Ambulatory Visit: Payer: Medicaid Other | Admitting: Urology

## 2020-08-23 ENCOUNTER — Other Ambulatory Visit: Payer: Medicaid Other | Admitting: Urology

## 2020-08-24 ENCOUNTER — Encounter: Payer: Self-pay | Admitting: Urology

## 2020-09-12 ENCOUNTER — Other Ambulatory Visit: Payer: Medicaid Other

## 2021-01-16 IMAGING — CR DG CHEST 2V
2 series · 2 of 2 positions shown · non-contrast
Comparison: December 16, 2018

CLINICAL DATA: RIGHT-sided chest pain

EXAM:
CHEST - 2 VIEW

[w chest pa]
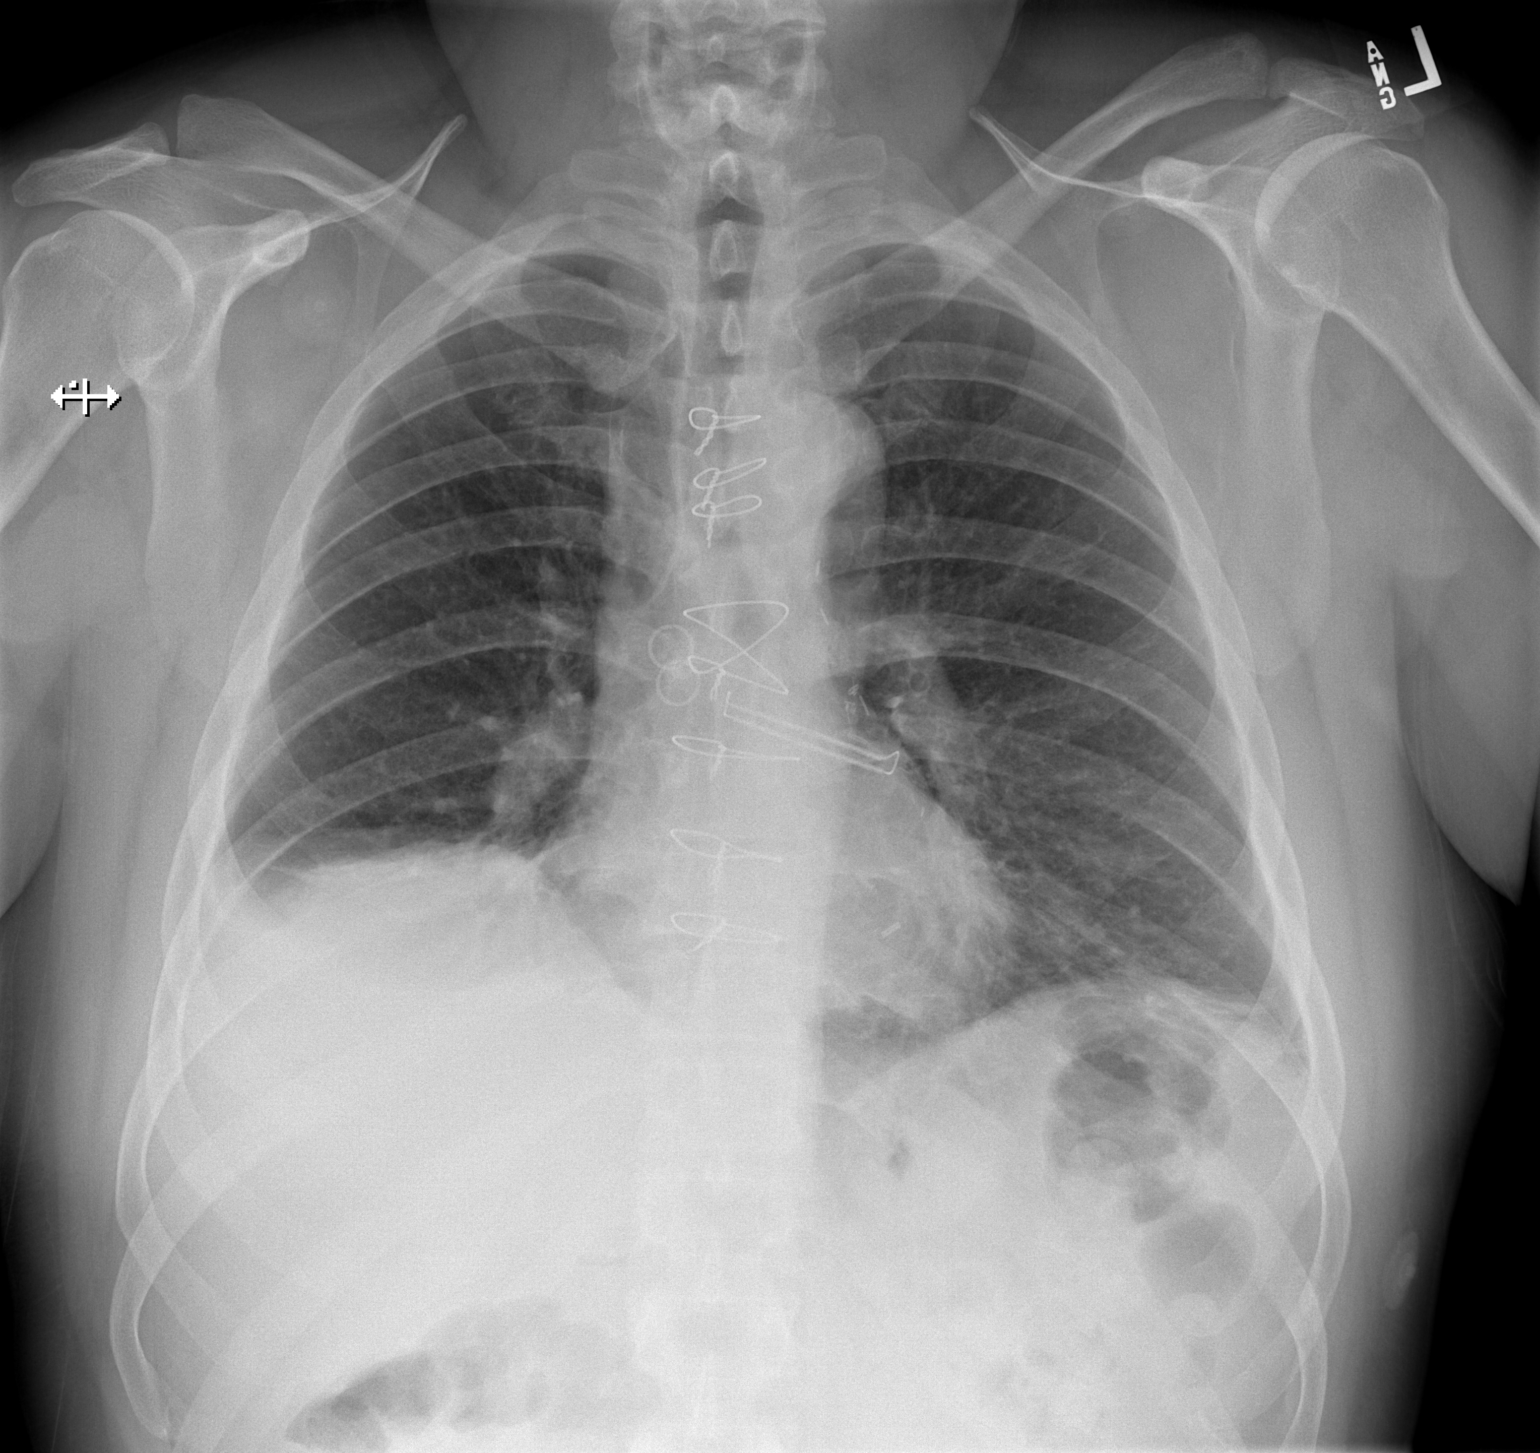

[w chest lat]
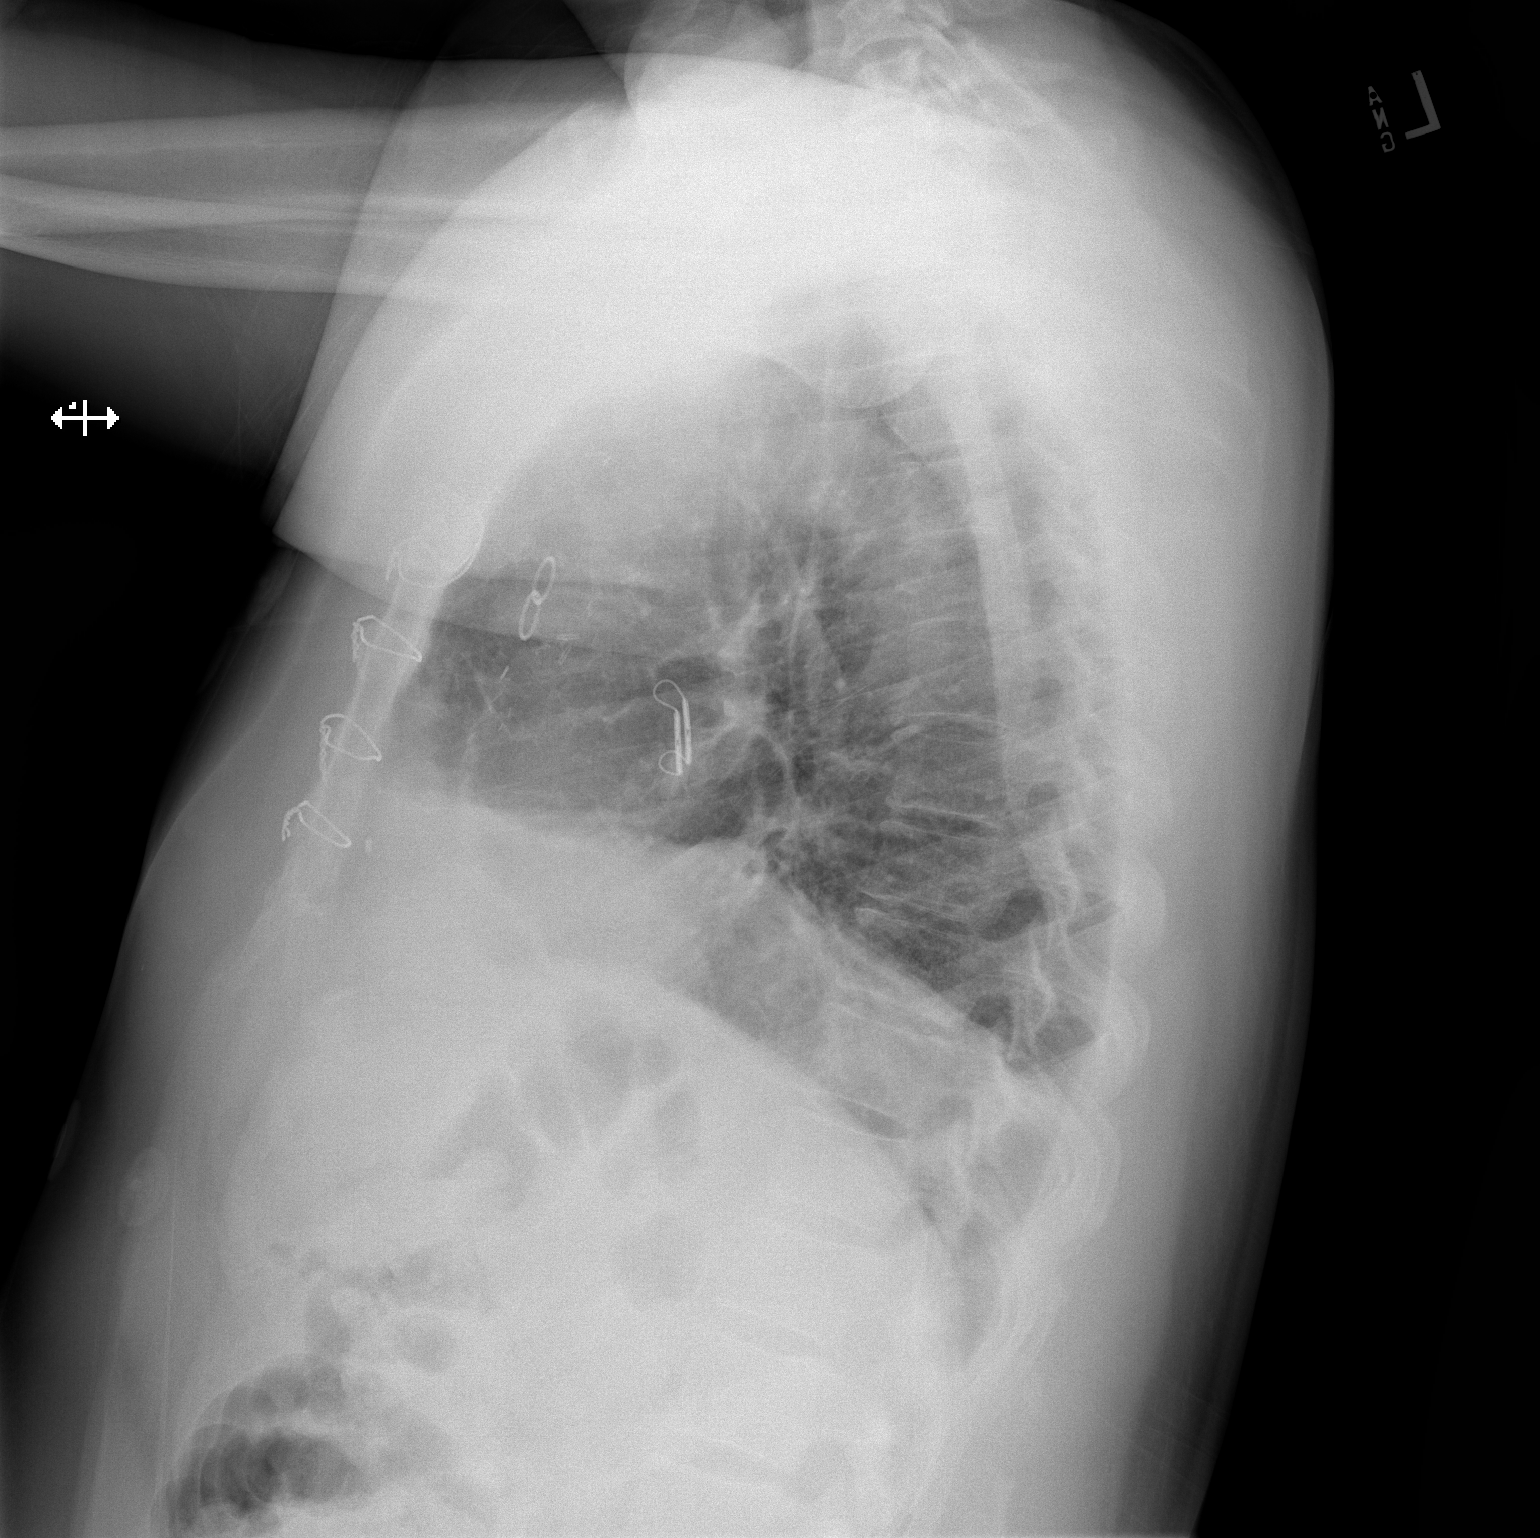

[2 of 2 positions shown; findings below may reference images not displayed]

FINDINGS: The cardiomediastinal silhouette is unchanged in contour.Status post
median sternotomy, CABG and atrial appendage clip. Trace RIGHT
pleural effusion. No pneumothorax. New RIGHT basilar platelike
opacity. New LEFT basilar linear opacities. Visualized abdomen is
unremarkable. No acute osseous abnormality.
IMPRESSION: 1. New bibasilar platelike opacities, likely atelectasis. Infection
or infarction could present similarly.
2. Trace RIGHT pleural effusion.

## 2021-04-19 IMAGING — CT CT ABD-PEL WO/W CM
3 of 12 series · 11 of 46 positions shown, 17 images · IV contrast (omnipaque)
Comparison: None.

CLINICAL DATA: Microscopic hematuria, scrotal swelling

EXAM:
CT ABDOMEN AND PELVIS WITHOUT AND WITH CONTRAST
TECHNIQUE: Multidetector CT imaging of the abdomen and pelvis was performed
following the standard protocol before and following the bolus
administration of intravenous contrast.
CONTRAST:  125mL OMNIPAQUE IOHEXOL 300 MG/ML  SOLN

[Series 5: cor without without pre 2.00 cor · coronal · non-contrast · 0.71mm/px · 2 of 147 slices shown, 3 images]
[im 49/147  soft-tissue]
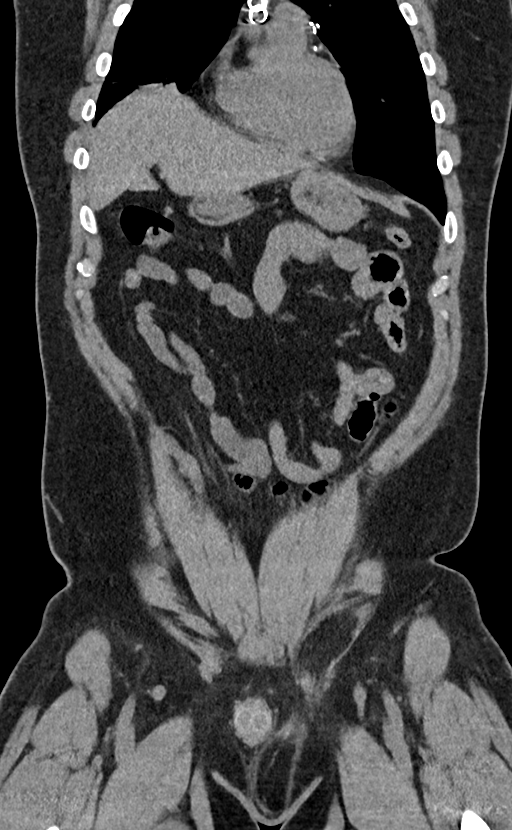
[im 49/147  bone]
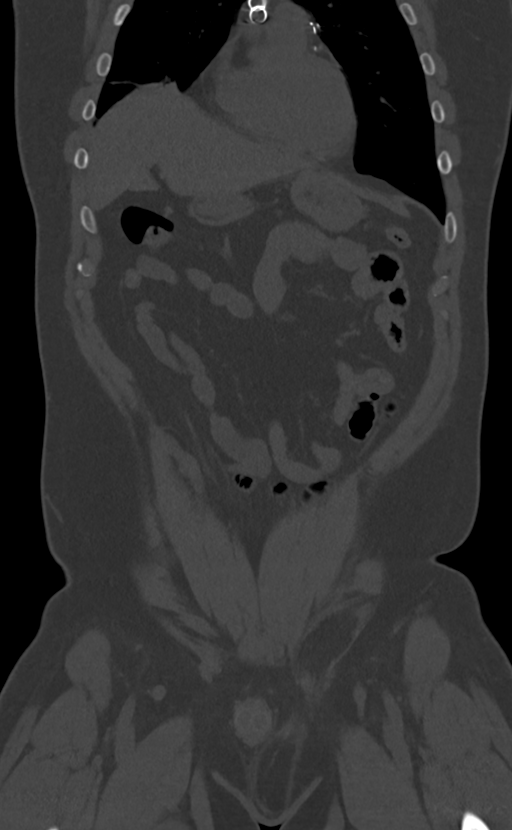
[im 98/147  soft-tissue]
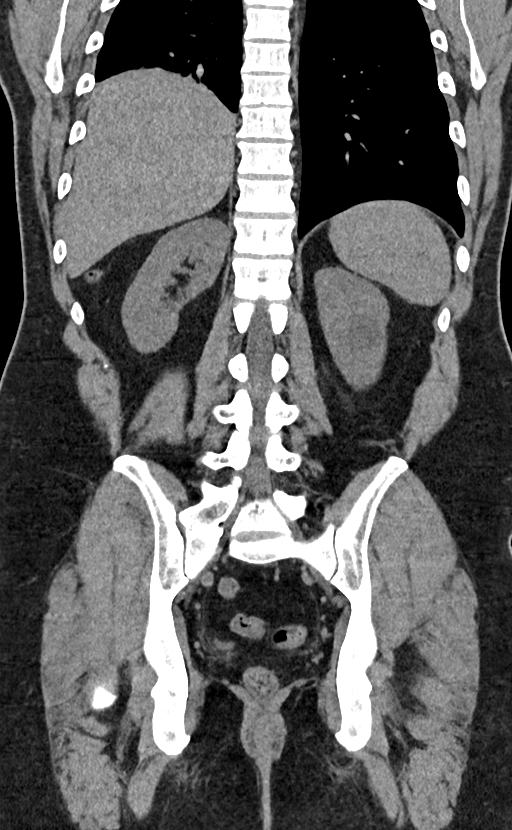

[Series 9: axial with hematuria with 5.00 · axial · 0.71mm/px · z∈[-1531,-1116]mm · 6 of 117 slices shown, 11 images]
[im 17/117  soft-tissue]
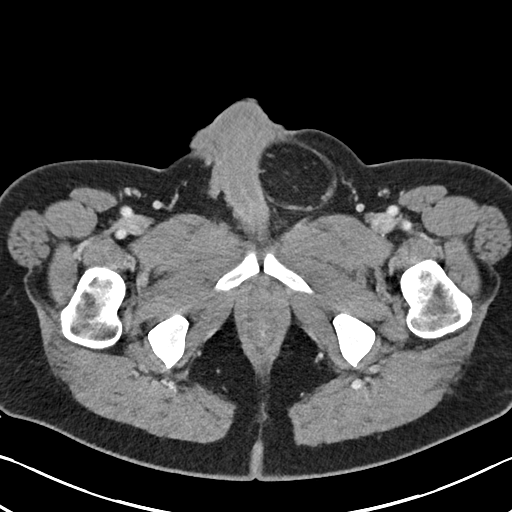
[im 17/117  bone]
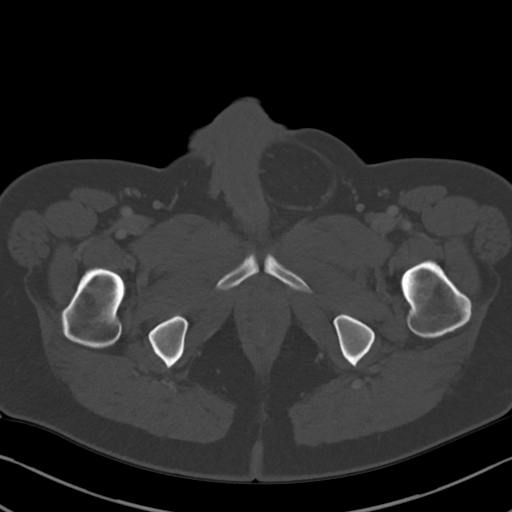
[im 34/117  soft-tissue]
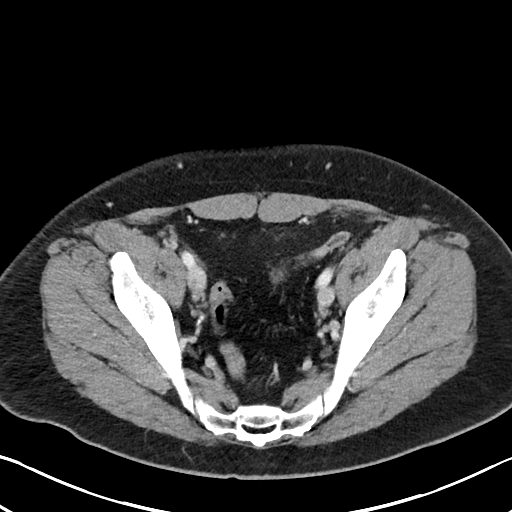
[im 50/117  soft-tissue]
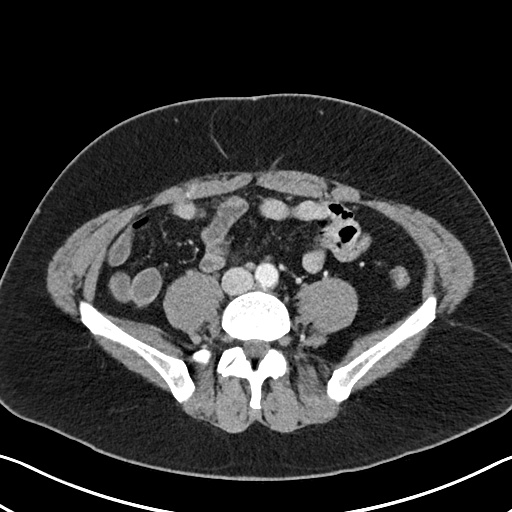
[im 50/117  lung]
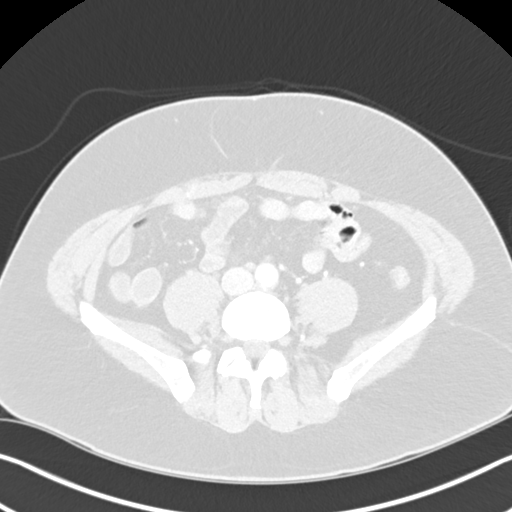
[im 67/117  soft-tissue]
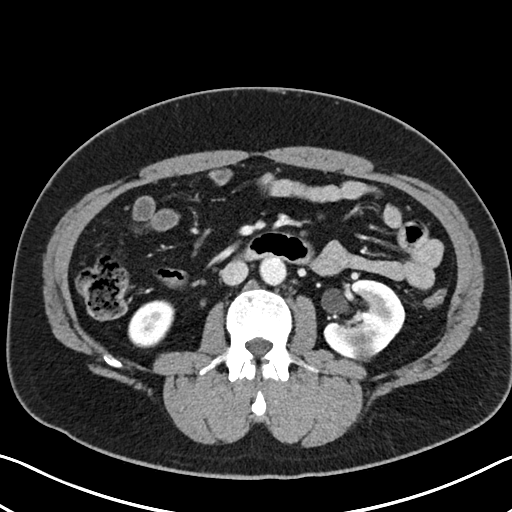
[im 67/117  lung]
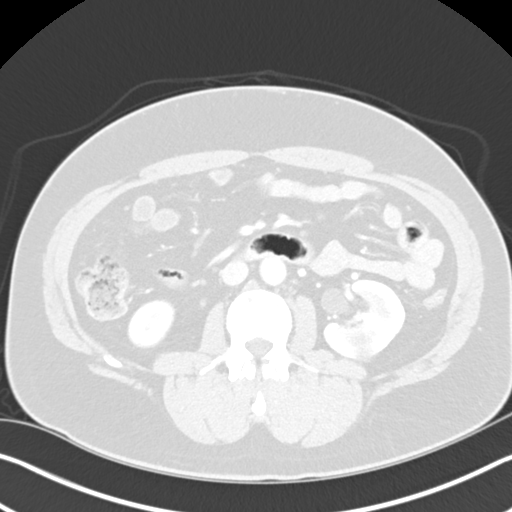
[im 83/117  soft-tissue]
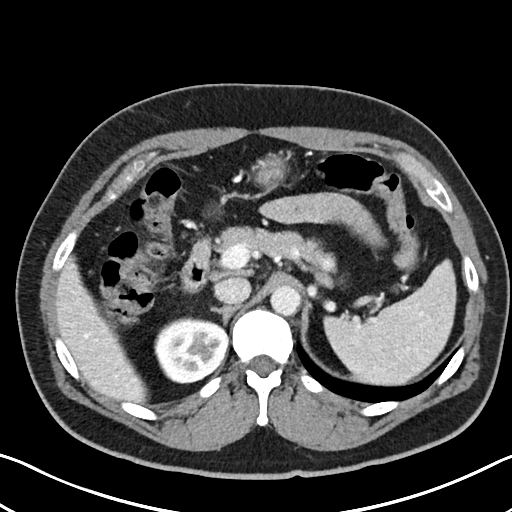
[im 83/117  lung]
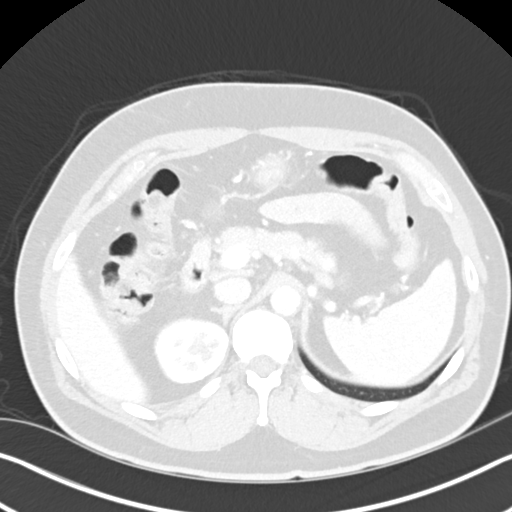
[im 100/117  soft-tissue]
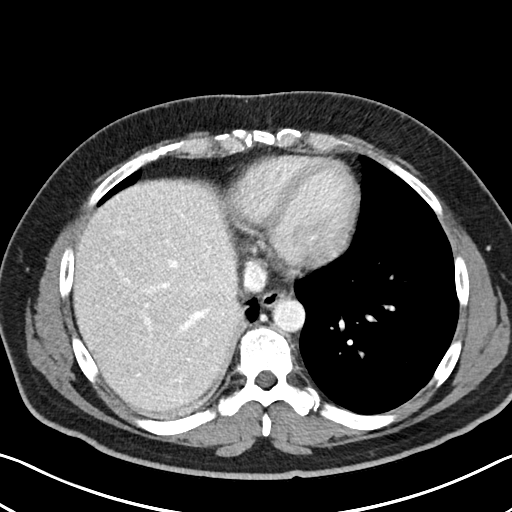
[im 100/117  lung]
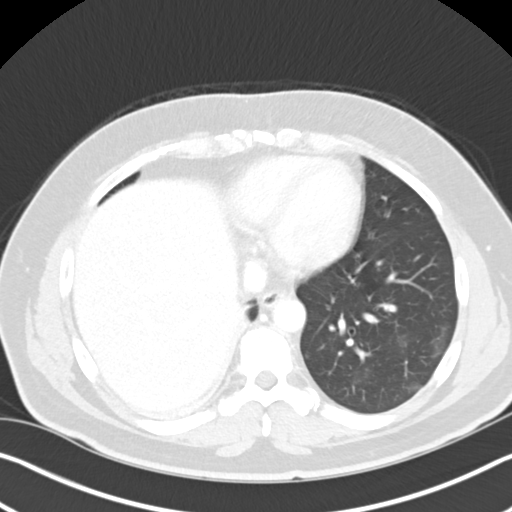

[Series 17: axial delay delay prone 5.00 · axial · delayed · 0.71mm/px · z∈[-1420,-1260]mm · 3 of 113 slices shown]
[im 17/113  soft-tissue]
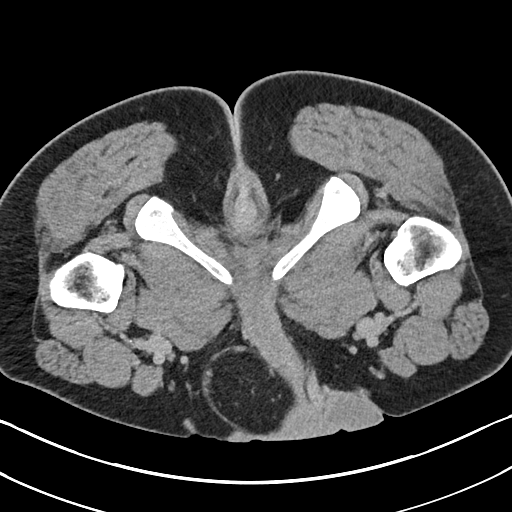
[im 33/113  soft-tissue]
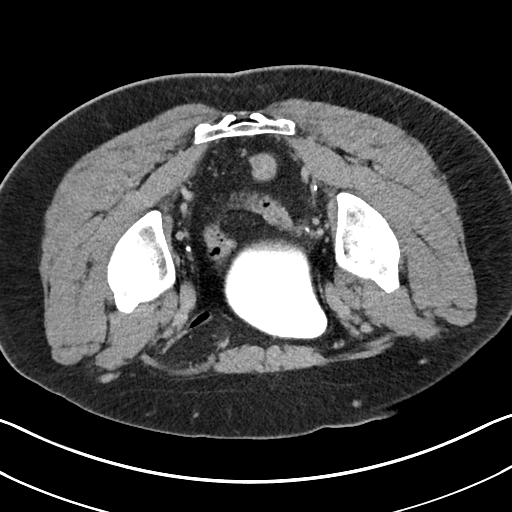
[im 49/113  soft-tissue]
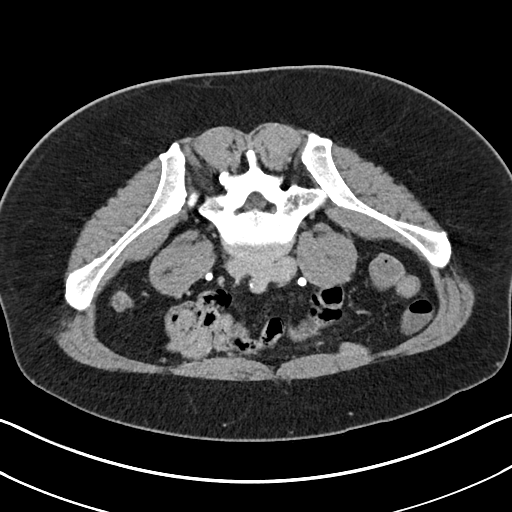

[11 of 46 positions shown; findings below may reference images not displayed]

FINDINGS: Lower chest: No acute abnormality. Elevation of the right
hemidiaphragm with associated scarring and or atelectasis of the
right lung base. Coronary artery calcifications and/or stents.
Status post median sternotomy and CABG. Scattered ground-glass
airspace opacities throughout the included lower lungs.

Hepatobiliary: No solid liver abnormality is seen. No gallstones,
gallbladder wall thickening, or biliary dilatation.

Pancreas: Unremarkable. No pancreatic ductal dilatation or
surrounding inflammatory changes.

Spleen: Normal in size without significant abnormality.

Adrenals/Urinary Tract: Adrenal glands are unremarkable. Benign
simple cyst of the posterior midportion of the left kidney kidneys
are otherwise normal, without renal calculi, solid lesion, or
hydronephrosis. No urinary tract filling defect on delayed phase
imaging. Bladder is unremarkable.

Stomach/Bowel: Stomach is within normal limits. Appendix appears
normal. No evidence of bowel wall thickening, distention, or
inflammatory changes.

Vascular/Lymphatic: Aortic atherosclerosis. No enlarged abdominal or
pelvic lymph nodes.

Reproductive: Mild prostatomegaly.

Other: Large, fat containing left inguinal hernia. No abdominopelvic
ascites.

Musculoskeletal: No acute or significant osseous findings.
IMPRESSION: 1. No CT findings to explain hematuria. No evidence of urinary tract
mass, calculus or hydronephrosis. No urinary tract filling defect on
delayed phase imaging.
2. Mild prostatomegaly.
3. Large, fat containing left inguinal hernia.
4. Scattered ground-glass airspace opacities throughout the included
lower lungs, nonspecific and infectious or inflammatory.
5. Elevation of the right hemidiaphragm with scarring and or
atelectasis of the right lung base.
6. Coronary artery disease.

Aortic Atherosclerosis ([id number]).

## 2021-05-21 ENCOUNTER — Telehealth: Payer: Self-pay

## 2021-05-21 DIAGNOSIS — Z006 Encounter for examination for normal comparison and control in clinical research program: Secondary | ICD-10-CM

## 2021-05-21 NOTE — Telephone Encounter (Signed)
Tried to contact patient via phone to discuss Prev[employer] trial. Numbers not working. Email sent to one on file

## 2021-06-14 ENCOUNTER — Emergency Department (HOSPITAL_COMMUNITY): Payer: Medicaid Other

## 2021-06-14 ENCOUNTER — Emergency Department (HOSPITAL_COMMUNITY)
Admission: EM | Admit: 2021-06-14 | Discharge: 2021-06-14 | Disposition: A | Discharge status: Home / Self Care | Payer: Medicaid Other | Attending: Emergency Medicine | Admitting: Emergency Medicine

## 2021-06-14 ENCOUNTER — Other Ambulatory Visit: Payer: Self-pay

## 2021-06-14 ENCOUNTER — Encounter (HOSPITAL_COMMUNITY): Payer: Self-pay

## 2021-06-14 DIAGNOSIS — N503 Cyst of epididymis: Secondary | ICD-10-CM | POA: Insufficient documentation

## 2021-06-14 DIAGNOSIS — I861 Scrotal varices: Secondary | ICD-10-CM | POA: Diagnosis not present

## 2021-06-14 DIAGNOSIS — Z7982 Long term (current) use of aspirin: Secondary | ICD-10-CM | POA: Insufficient documentation

## 2021-06-14 DIAGNOSIS — N433 Hydrocele, unspecified: Secondary | ICD-10-CM | POA: Diagnosis not present

## 2021-06-14 DIAGNOSIS — R1032 Left lower quadrant pain: Secondary | ICD-10-CM | POA: Diagnosis present

## 2021-06-14 DIAGNOSIS — Z20822 Contact with and (suspected) exposure to covid-19: Secondary | ICD-10-CM | POA: Diagnosis not present

## 2021-06-14 DIAGNOSIS — K4090 Unilateral inguinal hernia, without obstruction or gangrene, not specified as recurrent: Secondary | ICD-10-CM | POA: Insufficient documentation

## 2021-06-14 LAB — COMPREHENSIVE METABOLIC PANEL
ALT: 14 U/L (ref 0–44)
AST: 15 U/L (ref 15–41)
Albumin: 3.7 g/dL (ref 3.5–5.0)
Alkaline Phosphatase: 61 U/L (ref 38–126)
Anion gap: 8 (ref 5–15)
BUN: 17 mg/dL (ref 6–20)
CO2: 25 mmol/L (ref 22–32)
Calcium: 9 mg/dL (ref 8.9–10.3)
Chloride: 104 mmol/L (ref 98–111)
Creatinine, Ser: 1.08 mg/dL (ref 0.61–1.24)
GFR, Estimated: >60 mL/min (ref >60)
Glucose, Bld: 106 mg/dL — ABNORMAL HIGH (ref 70–99)
Potassium: 3.8 mmol/L (ref 3.5–5.1)
Sodium: 137 mmol/L (ref 135–145)
Total Bilirubin: 0.6 mg/dL (ref 0.3–1.2)
Total Protein: 6.6 g/dL (ref 6.5–8.1)

## 2021-06-14 LAB — CBC
HCT: 46.3 % (ref 39.0–52.0)
Hemoglobin: 15.1 g/dL (ref 13.0–17.0)
MCH: 29.2 pg (ref 26.0–34.0)
MCHC: 32.6 g/dL (ref 30.0–36.0)
MCV: 89.4 fL (ref 80.0–100.0)
Platelets: 226 10*3/uL (ref 150–400)
RBC: 5.18 MIL/uL (ref 4.22–5.81)
RDW: 14 % (ref 11.5–15.5)
WBC: 10.4 10*3/uL (ref 4.0–10.5)
nRBC: 0 % (ref 0.0–0.2)

## 2021-06-14 LAB — URINALYSIS, ROUTINE W REFLEX MICROSCOPIC
Bilirubin Urine: NEGATIVE
Glucose, UA: NEGATIVE mg/dL
Hgb urine dipstick: NEGATIVE
Ketones, ur: NEGATIVE mg/dL
Leukocytes,Ua: NEGATIVE
Nitrite: NEGATIVE
Protein, ur: NEGATIVE mg/dL
Specific Gravity, Urine: 1.029 (ref 1.005–1.030)
pH: 6 (ref 5.0–8.0)

## 2021-06-14 LAB — LIPASE, BLOOD: Lipase: 29 U/L (ref 11–51)

## 2021-06-14 LAB — RESP PANEL BY RT-PCR (FLU A&B, COVID) ARPGX2
Influenza A by PCR: NEGATIVE
Influenza B by PCR: NEGATIVE
SARS Coronavirus 2 by RT PCR: NEGATIVE

## 2021-06-14 MED ORDER — OXYCODONE-ACETAMINOPHEN 5-325 MG PO TABS: 1.0000 | ORAL_TABLET | ORAL | 0 refills | Status: DC | PRN

## 2021-06-14 MED ORDER — DOCUSATE SODIUM 100 MG PO CAPS: 100.0000 mg | ORAL_CAPSULE | Freq: Two times a day (BID) | ORAL | 0 refills | Status: DC

## 2021-06-14 MED ORDER — OXYCODONE-ACETAMINOPHEN 5-325 MG PO TABS
2.0000 | ORAL_TABLET | Freq: Once | ORAL | Status: AC
  Administered 2021-06-14: 2 via ORAL
  Filled 2021-06-14: qty 2

## 2021-06-14 NOTE — ED Triage Notes (Signed)
Pt here POV d/t left groin pain and testicle swelling. Pt works on a [employer] un[employer]. Caught approx 250lb clutch . This am pt states pain had increased to 7-8/10 and left testicle is swollen.  Pt endorses extensive cardiac history.

## 2021-06-14 NOTE — ED Provider Notes (Signed)
[facility] EMERGENCY DEPARTMENT Provider Note   CSN: [id number] Arrival date & time: 06/14/21  1410     History  Chief Complaint  Patient presents with   Groin Swelling    George Munoz is a 51 y.o. male.  HPI 51 year old male with left lower abdominal pain/groin pain as well as scrotal swelling.  He states that he caught a 200+ pound piece of equipment 2 days ago at work out of instinct.  Later that day he noticed pain in his left groin.  Yesterday noticed some swelling to his scrotum.  The pain is worse with certain movements like coughing, sitting or standing.  No urinary symptoms.  Took Tylenol with no relief.  Home Medications Prior to Admission medications   Medication Sig Start Date End Date Taking? Authorizing Provider  docusate sodium (COLACE) 100 MG capsule Take 1 capsule (100 mg total) by mouth every 12 (twelve) hours. 06/14/21  Yes Pricilla Loveless, MD  oxyCODONE-acetaminophen (PERCOCET) 5-325 MG tablet Take 1 tablet by mouth every 4 (four) hours as needed for severe pain. 06/14/21  Yes Pricilla Loveless, MD  aspirin EC 81 MG tablet Take 1 tablet (81 mg total) by mouth daily. 11/29/18   Ardelle Balls, PA-C  FLUoxetine (PROZAC) 40 MG capsule Take 40 mg by mouth daily.    [provider]  gabapentin (NEURONTIN) 300 MG capsule Take 300 mg by mouth as needed. 06/04/20   [provider]  Multiple Vitamin (MULTIVITAMIN) tablet Take 1 tablet by mouth daily.    [provider]  omega-3 acid ethyl esters (LOVAZA) 1 g capsule Take 1 g by mouth daily.    [provider]  pantoprazole (PROTONIX) 40 MG tablet TAKE 1 TABLET BY MOUTH EVERY DAY (PLS KEEP APPT FOR FEB 2022 FOR FUTURE REFILLS) 06/20/20   Kathleene Hazel, MD  rosuvastatin (CRESTOR) 40 MG tablet Take 1 tablet (40 mg total) by mouth daily. 06/20/20   Kathleene Hazel, MD      Allergies    Patient has no known allergies.    Review of Systems   Review  of Systems  Gastrointestinal:  Positive for abdominal pain. Negative for vomiting.  Genitourinary:  Positive for scrotal swelling. Negative for dysuria.   Physical Exam Updated Vital Signs BP (!) 139/91    Pulse 78    Temp 98.4 F (36.9 C) (Oral)    Resp 17    Ht 5\' 10"  (1.778 m)    Wt 90.7 kg    SpO2 95%    BMI 28.70 kg/m  Physical Exam Vitals and nursing note reviewed.  Constitutional:      Appearance: He is well-developed.  HENT:     Head: Normocephalic and atraumatic.  Pulmonary:     Effort: Pulmonary effort is normal.  Abdominal:     Palpations: Abdomen is soft.     Tenderness: There is no abdominal tenderness.     Hernia: A hernia is present. Hernia is present in the left inguinal area.  Genitourinary:    Testes:        Right: Swelling present.        Left: Swelling present.     Comments: Diffuse scrotal swelling.  Mild tenderness. Skin:    General: Skin is warm and dry.  Neurological:     Mental Status: He is alert.    ED Results / Procedures / Treatments   Labs (all labs ordered are listed, but only abnormal results are displayed) Labs Reviewed  COMPREHENSIVE METABOLIC PANEL - Abnormal; Notable for the following components:      Result Value   Glucose, Bld 106 (*)    All other components within normal limits  URINALYSIS, ROUTINE W REFLEX MICROSCOPIC - Abnormal; Notable for the following components:   Color, Urine AMBER (*)    All other components within normal limits  RESP PANEL BY RT-PCR (FLU A&B, COVID) ARPGX2  CBC  LIPASE, BLOOD    EKG None  Radiology US SCROTUM W/DOPPLER  Result Date: 06/14/2021 CLINICAL DATA:  LEFT testicular pain and swelling after heavy lifting at work EXAM: SCROTAL ULTRASOUND DOPPLER ULTRASOUND OF THE TESTICLES TECHNIQUE: Complete ultrasound examination of the testicles, epididymis, and other scrotal structures was performed. Color and spectral Doppler ultrasound were also utilized to evaluate blood flow to the testicles.  COMPARISON:  07/30/2020 FINDINGS: Right testicle Measurements: 4.3 x 2.5 x 2.9 cm. Normal echogenicity without mass or calcification. Internal blood flow present on color Doppler imaging. Left testicle Measurements: 4.3 x 2.4 x 3.3 cm. Normal echogenicity without mass or calcification. Internal blood flow present on color Doppler imaging. Right epididymis: Small cyst at head of epididymis measuring 3 x 2 x 4 mm Left epididymis: Cyst at head of LEFT epididymis 12 x 6 x 7 mm question epididymal cyst versus spermatocele. Hydrocele:  Trace BILATERAL hydroceles Varicocele:  Absent bilaterally Pulsed Doppler interrogation of both testes demonstrates normal low resistance arterial and venous waveforms bilaterally. LEFT inguinal hernia identified containing fat, extending into LEFT scrotum. IMPRESSION: Normal appearing testes without evidence of mass or torsion. Small cysts at the head of the epididymi bilaterally, 12 mm greatest size on LEFT, question epididymal cyst versus spermatocele. LEFT inguinal hernia containing fat, extending into the LEFT scrotum. Electronically Signed   By: Ulyses Southward M.D.   On: 06/14/2021 15:48    Procedures Procedures    Medications Ordered in ED Medications  oxyCODONE-acetaminophen (PERCOCET/ROXICET) 5-325 MG per tablet 2 tablet (2 tablets Oral Given 06/14/21 1741)    ED Course/ Medical Decision Making/ A&P                            Patient presents with a left inguinal hernia.  Unfortunately despite good pain control with oral Percocet given here, I cannot reduce his hernia.  He was left in Trendelenburg with some ice applied.  I consulted with Dr. Derrell Lolling who was able to come down and reduce it.  Given this, I think he is stable for an outpatient surgical follow-up and repair.  He seemed to understand return precautions discussed this with his significant other at the bedside as well.  Labs have been personally reviewed/interpreted and these show a normal WBC and benign  electrolytes.  Urinalysis is unremarkable.  Ultrasound images viewed by myself as well and there is no obvious torsion.  The swelling appears to have been from the hernia contents.  We will discharge with a prescription for oxycodone for breakthrough pain as well as Colace to help prevent constipation.        Final Clinical Impression(s) / ED Diagnoses Final diagnoses:  Left inguinal hernia    Rx / DC Orders ED Discharge Orders          Ordered    oxyCODONE-acetaminophen (PERCOCET) 5-325 MG tablet  Every 4 hours PRN        06/14/21 1916    docusate sodium (COLACE) 100 MG capsule  Every 12 hours  06/14/21 1916              Pricilla Loveless, MD 06/14/21 2010

## 2021-06-14 NOTE — ED Provider Triage Note (Signed)
Emergency Medicine Provider Triage Evaluation Note  George Munoz , a 51 y.o. male  was evaluated in triage.  Pt complains of left testicle swelling.  States that on Wednesday at work he caught a 250 pound clutch.  No injuries associated with that that he knew of, however that night he developed left groin pain that he thought might of been attributed to pulling a muscle.  Awoke Thursday morning with left testicular swelling that has been worsening.  States that his left testicle is now the size of a baseball.  He still has pain on the left side of his groin as well.  Denies any shortness of breath, abdominal swelling, or leg swelling.  Endorses some nausea but no vomiting or diarrhea.  Review of Systems  Positive: Testicular swelling, groin pain Negative: Fever, chills, chest pain, shortness of breath  Physical Exam  BP (!) 161/111 (BP Location: Right Arm)    Pulse 85    Temp 98.5 F (36.9 C) (Oral)    Resp 18    SpO2 100%  Gen:   Awake, no distress   Resp:  Normal effort  MSK:   Moves extremities without difficulty  Other:    Medical Decision Making  Medically screening exam initiated at 2:44 PM.  Appropriate orders placed.  Azariah Bonura was informed that the remainder of the evaluation will be completed by another provider, this initial triage assessment does not replace that evaluation, and the importance of remaining in the ED until their evaluation is complete.     Silva Bandy, PA-C 06/14/21 1447

## 2021-06-14 NOTE — Discharge Instructions (Addendum)
If you develop uncontrolled pain, swelling that you cannot push back in, vomiting, fever, skin color changes in your groin/testicles, or any other new/concerning symptoms or return to the ER for evaluation.

## 2021-07-01 ENCOUNTER — Telehealth: Payer: Self-pay | Admitting: *Deleted

## 2021-07-01 DIAGNOSIS — Z006 Encounter for examination for normal comparison and control in clinical research program: Secondary | ICD-10-CM

## 2021-07-01 NOTE — Telephone Encounter (Signed)
I called patient about the Prevail Study. I talked to patient about the study. He wants me to e-mail the consent. Patient is having hernia surgery now. Patient will let me know if he has any interest in the study. ?

## 2021-07-05 ENCOUNTER — Ambulatory Visit: Payer: Self-pay | Admitting: General Surgery

## 2021-07-05 NOTE — Progress Notes (Signed)
Sent message, via epic in basket, requesting orders in epic from surgeon.  

## 2021-07-08 NOTE — Progress Notes (Signed)
DUE TO COVID-19 ONLY ONE VISITOR IS ALLOWED TO COME WITH YOU AND STAY IN THE WAITING ROOM ONLY DURING PRE OP AND PROCEDURE DAY OF SURGERY.  2 VISITOR  MAY VISIT WITH YOU AFTER SURGERY IN YOUR PRIVATE ROOM DURING VISITING HOURS ONLY! ?YOU MAY HAVE ONE PERSON SPEND THE NITE WITH YOU IN YOUR ROOM AFTER SURGERY.   ? ? ? Your procedure is scheduled on:  ?    07/15/2021  ? Report to [facility] Main  Entrance ? ? Report to admitting at    100 pm ?DO NOT BRING INSURANCE CARD, PICTURE ID OR WALLET DAY OF SURGERY.  ?  ? ? Call this number if you have problems the morning of surgery [phone]  ? ? REMEMBER: NO  SOLID FOODS , CANDY, GUM OR MINTS AFTER MIDNITE THE NITE BEFORE SURGERY .       Marland Kitchen CLEAR LIQUIDS UNTIL    1215 pm             DAY OF SURGERY.      PLEASE FINISH ENSURE DRINK PER SURGEON ORDER  WHICH NEEDS TO BE COMPLETED AT    1215 pm  day  OF SURGERY.   ? ? ? ? ?CLEAR LIQUID DIET ? ? ?Foods Allowed      ?WATER ?BLACK COFFEE ( SUGAR OK, NO MILK, CREAM OR CREAMER) REGULAR AND DECAF  ?TEA ( SUGAR OK NO MILK, CREAM, OR CREAMER) REGULAR AND DECAF  ?PLAIN JELLO ( NO RED)  ?FRUIT ICES ( NO RED, NO FRUIT PULP)  ?POPSICLES ( NO RED)  ?JUICE- APPLE, WHITE GRAPE AND WHITE CRANBERRY  ?SPORT DRINK LIKE GATORADE ( NO RED)  ?CLEAR BROTH ( VEGETABLE , CHICKEN OR BEEF)                                                               ? ?    ? ?BRUSH YOUR TEETH MORNING OF SURGERY AND RINSE YOUR MOUTH OUT, NO CHEWING GUM CANDY OR MINTS. ?  ? ? Take these medicines the morning of surgery with A SIP OF WATER:  protonix  ? ? ?DO NOT TAKE ANY DIABETIC MEDICATIONS DAY OF YOUR SURGERY ?                  ?            You may not have any metal on your body including hair pins and  ?            piercings  Do not wear jewelry, make-up, lotions, powders or perfumes, deodorant ?            Do not wear nail polish on your fingernails.   ?           IF YOU ARE A MALE AND WANT TO SHAVE UNDER ARMS OR LEGS PRIOR TO SURGERY YOU MUST DO SO AT  LEAST 48 HOURS PRIOR TO SURGERY.  ?            Men may shave face and neck. ? ? Do not bring valuables to the hospital. [facility] IS NOT ?            RESPONSIBLE   FOR VALUABLES. ? Contacts, dentures or bridgework may not be worn into surgery. ?  Leave suitcase in the car. After surgery it may be brought to your room. ? ?  ? Patients discharged the day of surgery will not be allowed to drive home. IF YOU ARE HAVING SURGERY AND GOING HOME THE SAME DAY, YOU MUST HAVE AN ADULT TO DRIVE YOU HOME AND BE WITH YOU FOR 24 HOURS. YOU MAY GO HOME BY TAXI OR UBER OR ORTHERWISE, BUT AN ADULT MUST ACCOMPANY YOU HOME AND STAY WITH YOU FOR 24 HOURS. ?  ? ?            Please read over the following fact sheets you were given: ?_____________________________________________________________________ ? ?[facility] - Preparing for Surgery ?Before surgery, you can play an important role.  Because skin is not sterile, your skin needs to be as free of germs as possible.  You can reduce the number of germs on your skin by washing with CHG (chlorahexidine gluconate) soap before surgery.  CHG is an antiseptic cleaner which kills germs and bonds with the skin to continue killing germs even after washing. ?Please DO NOT use if you have an allergy to CHG or antibacterial soaps.  If your skin becomes reddened/irritated stop using the CHG and inform your nurse when you arrive at Short Stay. ?Do not shave (including legs and underarms) for at least 48 hours prior to the first CHG shower.  You may shave your face/neck. ?Please follow these instructions carefully: ? 1.  Shower with CHG Soap the night before surgery and the  morning of Surgery. ? 2.  If you choose to wash your hair, wash your hair first as usual with your  normal  shampoo. ? 3.  After you shampoo, rinse your hair and body thoroughly to remove the  shampoo.                           4.  Use CHG as you would any other liquid soap.  You can apply chg directly  to the skin and wash  ?                      Gently with a scrungie or clean washcloth. ? 5.  Apply the CHG Soap to your body ONLY FROM THE NECK DOWN.   Do not use on face/ open      ?                     Wound or open sores. Avoid contact with eyes, ears mouth and genitals (private parts).  ?                     Engineering geologist,  Genitals (private parts) with your normal soap. ?            6.  Wash thoroughly, paying special attention to the area where your surgery  will be performed. ? 7.  Thoroughly rinse your body with warm water from the neck down. ? 8.  DO NOT shower/wash with your normal soap after using and rinsing off  the CHG Soap. ?               9.  Pat yourself dry with a clean towel. ?           10.  Wear clean pajamas. ?           11.  Place clean sheets on your bed the night of your first  shower and do not  sleep with pets. ?Day of Surgery : ?Do not apply any lotions/deodorants the morning of surgery.  Please wear clean clothes to the hospital/surgery center. ? ?FAILURE TO FOLLOW THESE INSTRUCTIONS MAY RESULT IN THE CANCELLATION OF YOUR SURGERY ?PATIENT SIGNATURE_________________________________ ? ?NURSE SIGNATURE__________________________________ ? ?________________________________________________________________________  ? ? ?           ?

## 2021-07-09 ENCOUNTER — Encounter (HOSPITAL_COMMUNITY): Payer: Self-pay

## 2021-07-09 ENCOUNTER — Encounter (HOSPITAL_COMMUNITY)
Admission: RE | Admit: 2021-07-09 | Discharge: 2021-07-09 | Disposition: A | Discharge status: Home / Self Care | Payer: Medicaid Other | Source: Ambulatory Visit | Attending: General Surgery | Admitting: General Surgery

## 2021-07-09 ENCOUNTER — Other Ambulatory Visit: Payer: Self-pay

## 2021-07-09 VITALS — BP 145/97 | HR 73 | Temp 97.9°F | Resp 16 | Ht 70.0 in | Wt 185.0 lb

## 2021-07-09 DIAGNOSIS — K4090 Unilateral inguinal hernia, without obstruction or gangrene, not specified as recurrent: Secondary | ICD-10-CM | POA: Insufficient documentation

## 2021-07-09 DIAGNOSIS — I10 Essential (primary) hypertension: Secondary | ICD-10-CM | POA: Diagnosis not present

## 2021-07-09 DIAGNOSIS — I2510 Atherosclerotic heart disease of native coronary artery without angina pectoris: Secondary | ICD-10-CM | POA: Insufficient documentation

## 2021-07-09 DIAGNOSIS — Z01818 Encounter for other preprocedural examination: Secondary | ICD-10-CM | POA: Diagnosis not present

## 2021-07-09 DIAGNOSIS — I4891 Unspecified atrial fibrillation: Secondary | ICD-10-CM | POA: Diagnosis not present

## 2021-07-09 DIAGNOSIS — F17210 Nicotine dependence, cigarettes, uncomplicated: Secondary | ICD-10-CM | POA: Insufficient documentation

## 2021-07-09 DIAGNOSIS — Z951 Presence of aortocoronary bypass graft: Secondary | ICD-10-CM | POA: Diagnosis not present

## 2021-07-09 HISTORY — DX: Essential (primary) hypertension: I10

## 2021-07-09 HISTORY — DX: Acute myocardial infarction, unspecified: I21.9

## 2021-07-09 LAB — CBC
HCT: 50.7 % (ref 39.0–52.0)
Hemoglobin: 16.5 g/dL (ref 13.0–17.0)
MCH: 29 pg (ref 26.0–34.0)
MCHC: 32.5 g/dL (ref 30.0–36.0)
MCV: 89.1 fL (ref 80.0–100.0)
Platelets: 250 10*3/uL (ref 150–400)
RBC: 5.69 MIL/uL (ref 4.22–5.81)
RDW: 13.9 % (ref 11.5–15.5)
WBC: 8.7 10*3/uL (ref 4.0–10.5)
nRBC: 0 % (ref 0.0–0.2)

## 2021-07-09 LAB — BASIC METABOLIC PANEL
Anion gap: 8 (ref 5–15)
BUN: 14 mg/dL (ref 6–20)
CO2: 26 mmol/L (ref 22–32)
Calcium: 9 mg/dL (ref 8.9–10.3)
Chloride: 102 mmol/L (ref 98–111)
Creatinine, Ser: 0.77 mg/dL (ref 0.61–1.24)
GFR, Estimated: >60 mL/min (ref >60)
Glucose, Bld: 90 mg/dL (ref 70–99)
Potassium: 4.2 mmol/L (ref 3.5–5.1)
Sodium: 136 mmol/L (ref 135–145)

## 2021-07-09 NOTE — Progress Notes (Addendum)
Anesthesia Review: ? ?PCP: [facility]  ?Cardiologist : DR Clifton James LOV 06/18/20  ?Called [facility] for Clearance and spoke with Willaim Rayas .  She stated she would send urgent request to DR [facility] office for clearance.  ?DR Cliffton Asters- Cardiac surgeon  ?Chest x-ray : ?EKG : 07/09/21  ?Echo : ?Stress test: ?Cardiac Cath :  ?Activity level: can do  a flight of stairs without difficulty  ?Sleep Study/ CPAP : none  ?Fasting Blood Sugar :      / Checks Blood Sugar -- times a day:   ?Blood Thinner/ Instructions /Last Dose: ?ASA / Instructions/ Last Dose :  ?81 mg Aspirin- pt to call Dr Sheliah Hatch office in regards to Aspirin preop.   PT voiced understanding.  ?PT with hx of  cardiac arrest on 10/2018 followed by CABG x 3 10/2018 and hx of PAF .  ?

## 2021-07-10 NOTE — Progress Notes (Addendum)
Anesthesia Chart Review ? ? Case: [id number] Date/Time: 07/15/21 1500  ? Procedure: OPEN LEFT INGUINAL HERNIA REPAIR WITH MESH (Left)  ? Anesthesia type: General  ? Pre-op diagnosis: left inguinal hernia  ? Location: [facility] ROOM 02 / [facility]  ? Surgeons: Kinsinger, De Blanch, MD  ? ?  ? ? ?DISCUSSION:51 y.o. smoker with h/o HTN, CAD (CABG 2020), left inguinal hernia scheduled for above procedure 07/15/2021 with Dr. Feliciana Rossetti.  ? ?Pt last seen by cardiology 06/18/2020.  ? ?Cardiac clearance requested.  ? ?Addendum 07/12/2021:  ?Pt seen by cardiology 07/12/2021. Per OV note, "Pre-operative cardiac risk assessment: He has no signs or symptoms or angina, CHF or arrhythmias. He is very active and can achieve 4 METS without any difficulty. He can proceed with his planned surgical procedure without further cardiac workup."  ?  ?VS: BP (!) 145/97   Pulse 73   Temp 36.6 ?C (Oral)   Resp 16   Ht 5\' 10"  (1.778 m)   Wt 83.9 kg   SpO2 99%   BMI 26.54 kg/m?  ? ?PROVIDERS: ?[facility] ? ? ?LABS: Labs reviewed: Acceptable for surgery. ?(all labs ordered are listed, but only abnormal results are displayed) ? ?Labs Reviewed  ?CBC  ?BASIC METABOLIC PANEL  ? ? ? ?IMAGES: ? ? ?EKG: ?07/09/2021 ?Rate 77 bpm  ?NSR ?Nonspecific ST abnormality ? ? ?CV: ?Echo 11/22/2018 ? 1. The left ventricle has normal systolic function with an ejection  ?fraction of 60-65%. The cavity size was normal. Left ventricular diastolic  ?parameters were normal.  ? 2. The right ventricle has normal systolic function. The cavity was  ?normal. There is no increase  ?Past Medical History:  ?Diagnosis Date  ? Anxiety 11/23/2018  ? CAD (coronary artery disease)   ? S/P cardiac arrest 11/21/18, Cath-MVD, 11/25/18-3V CABG and LA clipping  ? Hyperlipidemia LDL goal <70   ? Hypertension   ? Myocardial infarction [facility])   ? Restless leg   ? Tobacco abuse   ? ? ?Past Surgical History:  ?Procedure Laterality Date  ? CLIPPING OF ATRIAL APPENDAGE N/A 11/25/2018   ? Procedure: CLIPPING OF ATRIAL APPENDAGE;  Surgeon: 11/27/2018, MD;  Location: [facility] OR;  Service: Open Heart Surgery;  Laterality: N/A;  FLOW TRACK  ? CORONARY ARTERY BYPASS GRAFT N/A 11/25/2018  ? Procedure: CORONARY ARTERY BYPASS GRAFTING (CABG) times three, using left mammary artery and right greater saphenous vein harvested endoscopically;  Surgeon: 11/27/2018, MD;  Location: [facility] OR;  Service: Open Heart Surgery;  Laterality: N/A;  ? LEFT HEART CATH AND CORONARY ANGIOGRAPHY N/A 11/21/2018  ? Procedure: LEFT HEART CATH AND CORONARY ANGIOGRAPHY;  Surgeon: 11/23/2018, MD;  Location: [facility] INVASIVE CV LAB;  Service: Cardiovascular;  Laterality: N/A;  ? TEE WITHOUT CARDIOVERSION N/A 11/25/2018  ? Procedure: TRANSESOPHAGEAL ECHOCARDIOGRAM (TEE);  Surgeon: 11/27/2018, MD;  Location[facility] OR;  Service: Open Heart Surgery;  Laterality: N/A;  ? ? ?MEDICATIONS: ? acetaminophen (TYLENOL) 500 MG tablet  ? aspirin EC 81 MG tablet  ? docusate sodium (COLACE) 100 MG capsule  ? omega-3 acid ethyl esters (LOVAZA) 1 g capsule  ? oxyCODONE-acetaminophen (PERCOCET) 5-325 MG tablet  ? pantoprazole (PROTONIX) 40 MG tablet  ? rosuvastatin (CRESTOR) 40 MG tablet  ? ?No current facility-administered medications for this encounter.  ? ? ? ?[facility] Ward, PA-C ?[facility] Pre-Surgical Testing ?[phone] ? ? ? ? ? ?

## 2021-07-12 ENCOUNTER — Other Ambulatory Visit: Payer: Self-pay

## 2021-07-12 ENCOUNTER — Encounter: Payer: Self-pay | Admitting: Cardiovascular Disease

## 2021-07-12 ENCOUNTER — Telehealth: Payer: Self-pay | Admitting: *Deleted

## 2021-07-12 ENCOUNTER — Encounter (HOSPITAL_COMMUNITY): Payer: Self-pay | Admitting: General Surgery

## 2021-07-12 ENCOUNTER — Ambulatory Visit: Payer: Medicaid Other | Admitting: Cardiovascular Disease

## 2021-07-12 VITALS — BP 140/90 | HR 96 | Ht 69.0 in | Wt 195.0 lb

## 2021-07-12 DIAGNOSIS — I2510 Atherosclerotic heart disease of native coronary artery without angina pectoris: Secondary | ICD-10-CM

## 2021-07-12 DIAGNOSIS — E7800 Pure hypercholesterolemia, unspecified: Secondary | ICD-10-CM | POA: Diagnosis not present

## 2021-07-12 DIAGNOSIS — Z01810 Encounter for preprocedural cardiovascular examination: Secondary | ICD-10-CM

## 2021-07-12 DIAGNOSIS — I10 Essential (primary) hypertension: Secondary | ICD-10-CM

## 2021-07-12 MED ORDER — AMLODIPINE BESYLATE 5 MG PO TABS: 5.0000 mg | ORAL_TABLET | Freq: Every day | ORAL | 3 refills | Status: DC

## 2021-07-12 NOTE — Progress Notes (Signed)
? ?Chief Complaint  ?Patient presents with  ? Follow-up  ?  CAD ?Pre-operative cardiac risk assessment  ? ?History of Present Illness: 51 yo male with history of CAD s/p CABG following cardiac arrest July 2020, PAF, hyperlipidemia and tobacco abuse who is here today for cardiac follow up. He was admitted to [facility] 11/21/18 following a cardiac arrest. He was found to have distal left main and triple vessel CAD and underwent 3V CABG on 11/25/18 (LIMA to LAD, SVG to DIag, SVG to OM) with left atrial clip placement. Echo July 2020 with LVEF=60-65% and no valve disease. Cardiac monitor August 2020 with sinus, no arrhythmias. He did not tolerate beta blocker due to fatigue.  ? ?He is here today for follow up. The patient denies any chest pain, dyspnea, palpitations, lower extremity edema, orthopnea, PND, dizziness, near syncope or syncope. Inguinal hernia repair is planned in 3 days. He needs pre-operative cardiac risk assessment.  ? ?Primary Care Physician: [place] ? ?Past Medical History:  ?Diagnosis Date  ? Anxiety 11/23/2018  ? CAD (coronary artery disease)   ? S/P cardiac arrest 11/21/18, Cath-MVD, 11/25/18-3V CABG and LA clipping  ? Hyperlipidemia LDL goal <70   ? Hypertension   ? Myocardial infarction [facility])   ? Restless leg   ? Tobacco abuse   ? ? ?Past Surgical History:  ?Procedure Laterality Date  ? CLIPPING OF ATRIAL APPENDAGE N/A 11/25/2018  ? Procedure: CLIPPING OF ATRIAL APPENDAGE;  Surgeon: Lajuana Matte, MD;  Location: [place];  Service: Open Heart Surgery;  Laterality: N/A;  FLOW TRACK  ? CORONARY ARTERY BYPASS GRAFT N/A 11/25/2018  ? Procedure: CORONARY ARTERY BYPASS GRAFTING (CABG) times three, using left mammary artery and right greater saphenous vein harvested endoscopically;  Surgeon: Lajuana Matte, MD;  Location: [place];  Service: Open Heart Surgery;  Laterality: N/A;  ? LEFT HEART CATH AND CORONARY ANGIOGRAPHY N/A 11/21/2018  ? Procedure: LEFT HEART CATH AND CORONARY  ANGIOGRAPHY;  Surgeon: Burnell Blanks, MD;  Location: [place] [facility];  Service: Cardiovascular;  Laterality: N/A;  ? TEE WITHOUT CARDIOVERSION N/A 11/25/2018  ? Procedure: TRANSESOPHAGEAL ECHOCARDIOGRAM (TEE);  Surgeon: Lajuana Matte, MD;  Location: [place];  Service: Open Heart Surgery;  Laterality: N/A;  ? ? ?Current Outpatient Medications  ?Medication Sig Dispense Refill  ? acetaminophen (TYLENOL) 500 MG tablet Take 1,000 mg by mouth every 6 (six) hours as needed for moderate pain.    ? aspirin EC 81 MG tablet Take 1 tablet (81 mg total) by mouth daily.    ? omega-3 acid ethyl esters (LOVAZA) 1 g capsule Take 1 g by mouth daily.    ? pantoprazole (PROTONIX) 40 MG tablet TAKE 1 TABLET BY MOUTH EVERY DAY (PLS KEEP APPT FOR FEB 2022 FOR FUTURE REFILLS) 90 tablet 3  ? rosuvastatin (CRESTOR) 40 MG tablet Take 1 tablet (40 mg total) by mouth daily. 90 tablet 3  ? docusate sodium (COLACE) 100 MG capsule Take 1 capsule (100 mg total) by mouth every 12 (twelve) hours. (Patient not taking: Reported on 07/12/2021) 60 capsule 0  ? oxyCODONE-acetaminophen (PERCOCET) 5-325 MG tablet Take 1 tablet by mouth every 4 (four) hours as needed for severe pain. (Patient not taking: Reported on 07/04/2021) 15 tablet 0  ? ?No current facility-administered medications for this visit.  ? ? ?No Known Allergies ? ?Social History  ? ?Socioeconomic History  ? Marital status: Married  ?  Spouse name: Not on file  ? Number  of children: Not on file  ? Years of education: Not on file  ? Highest education level: Not on file  ?Occupational History  ? Not on file  ?Tobacco Use  ? Smoking status: Every Day  ?  Packs/day: 0.75  ?  Types: Cigarettes  ? Smokeless tobacco: Never  ?Vaping Use  ? Vaping Use: Never used  ?Substance and Sexual Activity  ? Alcohol use: Not Currently  ? Drug use: Never  ? Sexual activity: Not on file  ?Other Topics Concern  ? Not on file  ?Social History Narrative  ? Not on file  ? ?Social Determinants of Health   ? ?Financial Resource Strain: Not on file  ?Food Insecurity: Not on file  ?Transportation Needs: Not on file  ?Physical Activity: Not on file  ?Stress: Not on file  ?Social Connections: Not on file  ?Intimate Partner Violence: Not on file  ? ? ?Family History  ?Problem Relation Age of Onset  ? Hypertension Father   ? Prostate cancer Neg Hx   ? Bladder Cancer Neg Hx   ? Kidney cancer Neg Hx   ? ? ?Review of Systems:  As stated in the HPI and otherwise negative.  ? ?BP 140/90   Pulse 96   Ht 5\' 9"  (1.753 m)   Wt 195 lb (88.5 kg)   SpO2 98%   BMI 28.80 kg/m?  ? ?Physical Examination: ?General: Well developed, well nourished, NAD  ?HEENT: OP clear, mucus membranes moist  ?SKIN: warm, dry. No rashes. ?Neuro: No focal deficits  ?Musculoskeletal: Muscle strength 5/5 all ext  ?Psychiatric: Mood and affect normal  ?Neck: No JVD, no carotid bruits, no thyromegaly, no lymphadenopathy.  ?Lungs:Clear bilaterally, no wheezes, rhonci, crackles ?Cardiovascular: Regular rate and rhythm. No murmurs, gallops or rubs. ?Abdomen:Soft. Bowel sounds present. Non-tender.  ?Extremities: No lower extremity edema. Pulses are 2 + in the bilateral DP/PT. ? ?EKG:  EKG is not ordered today. ?The ekg ordered today demonstrates  ? ?Echo 11/22/18: ? 1. The left ventricle has normal systolic function with an ejection fraction of 60-65%. The cavity size was normal. Left ventricular diastolic parameters were normal. ? 2. The right ventricle has normal systolic function. The cavity was normal. There is no increase ? ?Cardiac cath 11/21/18: ?Mid RCA lesion is 40% stenosed. ?Prox Cx lesion is 99% stenosed. ?1st Mrg lesion is 99% stenosed. ?2nd Diag lesion is 90% stenosed. ?Ost LM to Dist LM lesion is 50% stenosed. ?Prox RCA lesion is 50% stenosed. ?There is mild left ventricular systolic dysfunction. ?LV end diastolic pressure is normal. ?The left ventricular ejection fraction is 45-50% by visual estimate. ?There is no mitral valve regurgitation. ?Mid  LAD lesion is 95% stenosed. ?  ?1. Moderate distal left main stenosis ?2. Severe mid LAD stenosis ?3. Severe mid Circumflex stenosis involving the ostium of a very large obtuse marginal branch ?4. Moderate mid RCA stenosis ?5. Mild segmental LV systolic dysfunction ([id number]).  ?6. Post cardiac arrest (ventricular fibrillation) ?7. Rapid atrial fibrillation ? ?Recent Labs: ?06/14/2021: ALT 14 ?07/09/2021: BUN 14; Creatinine, Ser 0.77; Hemoglobin 16.5; Platelets 250; Potassium 4.2; Sodium 136  ? ?Lipid Panel ?   ?Component Value Date/Time  ? CHOL 214 (H) 06/18/2020 1031  ? TRIG 204 (H) 06/18/2020 1031  ? HDL 39 (L) 06/18/2020 1031  ? CHOLHDL 5.5 (H) 06/18/2020 1031  ? LDLCALC 138 (H) 06/18/2020 1031  ? ?  ?Wt Readings from Last 3 Encounters:  ?07/12/21 195 lb (88.5 kg)  ?07/09/21 185 lb (83.9  kg)  ?06/14/21 200 lb (90.7 kg)  ?  ? ?Assessment and Plan:  ? ?1. CAD s/p CABG: He underwent CABG July 2020. He is active and has no chest pain. Will continue ASA and statin. No beta blocker secondary to fatigue.  ? ?2. Atrial fibrillation, paroxysmal: Atrial fib following cardiac arrest in July 2020 prior to CABG. Left atrial appendage clipping July 2020. No recurrence of atrial fib noted on 30 day event monitor August 2020. No known recurrence.  ? ?3. HTN: BP has been elevated. We discussed starting Norvasc 5 mg daily. No changes today ? ?4. Tobacco abuse: Smoking cessation advised.  ? ?5. Hyperlipidemia: LDL not at goal in 2022 and he was changed from Lipitor to Crestor. He did not come in for follow up labs. He needs repeat lipids. LFTS normal in February 2023. Continue statin.  ? ?6. Pre-operative cardiac risk assessment: He has no signs or symptoms or angina, CHF or arrhythmias. He is very active and can achieve 4 METS without any difficulty. He can proceed with his planned surgical procedure without further cardiac workup.  ? ?Current medicines are reviewed at length with the patient today.  The patient does not have  concerns regarding medicines. ? ?The following changes have been made:  no change ? ?Labs/ tests ordered today include:  ? ?No orders of the defined types were placed in this encounter. ? ? ?Disposition:   FU with

## 2021-07-12 NOTE — Telephone Encounter (Signed)
Patient scheduled for this afternoon.

## 2021-07-12 NOTE — Progress Notes (Signed)
PCP -  ?Cardiologist - Dr. Clifton James ?EKG - 07/09/21 ?Chest x-ray -  ?ECHO - 11/25/18 ?Cardiac Cath - 11/21/18 ? ?Aspirin Instructions: Follow your surgeon's instructions on when to stop Aspirin.  If no instructions were given by your surgeon then you will need to call the office to get those instructions.   ?(Pt is seeing Dr. Clifton James at this moment - will ask MD about ASA)  ?ERAS Protcol - 1000 ?COVID TEST- n/a ? ?Anesthesia review: yes ? ?------------- ? ?SDW INSTRUCTIONS: ? ?Your procedure is scheduled on Monday 3/20. Please report to [facility] Main Entrance "A" at 1030 A.M., and check in at the Admitting office. Call this number if you have problems the morning of surgery: [phone] ? ? ?Remember: Do not eat after midnight the night before your surgery ? ?You may drink clear liquids until 10:00 AM the morning of your surgery.   ?Clear liquids allowed are: Water, Non-Citrus Juices (without pulp), Carbonated Beverages, Clear Tea, Black Coffee Only, and Gatorade (do not add anything to drinks)  ?  ?Medications to take morning of surgery with a sip of water include: ?acetaminophen (TYLENOL) if needed ?oxyCODONE-acetaminophen (PERCOCET) if needed ? ?pantoprazole (PROTONIX)  ?rosuvastatin (CRESTOR)  ? ?Follow your surgeon's instructions on when to stop Aspirin.  If no instructions were given by your surgeon then you will need to call the office to get those instructions.   ? ?As of today, STOP taking any Aleve, Naproxen, Ibuprofen, Motrin, Advil, Goody's, BC's, all herbal medications, fish oil, and all vitamins. ? ?  ?The Morning of Surgery ?Do not wear jewelry ?Do not wear lotions, powders, colognes, or deodorant ?Do not bring valuables to the hospital. ?[facility] is not responsible for any belongings or valuables. ? ?If you are a smoker, DO NOT Smoke 24 hours prior to surgery ? ?If you wear a CPAP at night please bring your mask the morning of surgery  ? ?Remember that you must have someone to transport you home  after your surgery, and remain with you for 24 hours if you are discharged the same day. ? ?Please bring cases for contacts, glasses, hearing aids, dentures or bridgework because it cannot be worn into surgery.  ? ?Patients discharged the day of surgery will not be allowed to drive home.  ? ?Please shower the NIGHT BEFORE/MORNING OF SURGERY (use antibacterial soap like DIAL soap if possible). Wear comfortable clothes the morning of surgery. Oral Hygiene is also important to reduce your risk of infection.  Remember - BRUSH YOUR TEETH THE MORNING OF SURGERY WITH YOUR REGULAR TOOTHPASTE ? ?Patient denies shortness of breath, fever, cough and chest pain.  ? ? ?   ? ?

## 2021-07-12 NOTE — Patient Instructions (Signed)
Medication Instructions:  ?Your physician has recommended you make the following change in your medication:  ?1.) start amlodipine (Norvasc) 5 mg - take one tablet daily ? ?*If you need a refill on your cardiac medications before your next appointment, please call your pharmacy* ? ? ?Lab Work: ?Please return in about 2-3 weeks for fasting lipids ? ?If you have labs (blood work) drawn today and your tests are completely normal, you will receive your results only by: ?MyChart Message (if you have MyChart) OR ?A paper copy in the mail ?If you have any lab test that is abnormal or we need to change your treatment, we will call you to review the results. ? ? ?Testing/Procedures: ?none ? ? ?Follow-Up: ?At [facility], you and your health needs are our priority.  As part of our continuing mission to provide you with exceptional heart care, we have created designated Provider Care Teams.  These Care Teams include your primary Cardiologist (physician) and Advanced Practice Providers (APPs -  Physician Assistants and Nurse Practitioners) who all work together to provide you with the care you need, when you need it. ? ? ?Your next appointment:   ?12 month(s) ? ?The format for your next appointment:   ?In Person ? ?Provider:   ?Lauree Chandler, MD   ? ? ?

## 2021-07-12 NOTE — Telephone Encounter (Signed)
Office was returning call to say that they have reach out to the patient to let him know bout the appt. Patient is supposed to call our office to confirm appt.  ?

## 2021-07-12 NOTE — Telephone Encounter (Signed)
Left message for patient to call back re: clearance request.  Called [facility] and advised Selena Batten that pre op clearance form was received for patient for left Inguinal Hernia Repair surgery scheduled for 07/15/21.  The patient has not been seen since 05/2020.  Possibly can see patient this afternoon.  Kim at [facility] will ask their triage to follow up with patient.   ? ? ?

## 2021-07-15 ENCOUNTER — Encounter (HOSPITAL_COMMUNITY): Payer: Self-pay | Admitting: General Surgery

## 2021-07-15 ENCOUNTER — Other Ambulatory Visit: Payer: Self-pay

## 2021-07-15 ENCOUNTER — Ambulatory Visit (HOSPITAL_BASED_OUTPATIENT_CLINIC_OR_DEPARTMENT_OTHER): Payer: Medicaid Other | Admitting: Anesthesiology

## 2021-07-15 ENCOUNTER — Ambulatory Visit (HOSPITAL_COMMUNITY)
Admission: RE | Admit: 2021-07-15 | Discharge: 2021-07-15 | Disposition: A | Discharge status: Home / Self Care | Payer: Medicaid Other | Source: Ambulatory Visit | Attending: General Surgery | Admitting: General Surgery

## 2021-07-15 ENCOUNTER — Ambulatory Visit (HOSPITAL_COMMUNITY): Payer: Medicaid Other | Admitting: Physician Assistant

## 2021-07-15 ENCOUNTER — Encounter (HOSPITAL_COMMUNITY)
Admission: RE | Disposition: A | Discharge status: Home / Self Care | Payer: Self-pay | Source: Ambulatory Visit | Attending: General Surgery

## 2021-07-15 DIAGNOSIS — Z951 Presence of aortocoronary bypass graft: Secondary | ICD-10-CM | POA: Diagnosis not present

## 2021-07-15 DIAGNOSIS — I2510 Atherosclerotic heart disease of native coronary artery without angina pectoris: Secondary | ICD-10-CM | POA: Diagnosis not present

## 2021-07-15 DIAGNOSIS — I10 Essential (primary) hypertension: Secondary | ICD-10-CM

## 2021-07-15 DIAGNOSIS — K219 Gastro-esophageal reflux disease without esophagitis: Secondary | ICD-10-CM | POA: Insufficient documentation

## 2021-07-15 DIAGNOSIS — F17200 Nicotine dependence, unspecified, uncomplicated: Secondary | ICD-10-CM | POA: Insufficient documentation

## 2021-07-15 DIAGNOSIS — I252 Old myocardial infarction: Secondary | ICD-10-CM | POA: Diagnosis not present

## 2021-07-15 DIAGNOSIS — I4891 Unspecified atrial fibrillation: Secondary | ICD-10-CM | POA: Insufficient documentation

## 2021-07-15 DIAGNOSIS — G8918 Other acute postprocedural pain: Secondary | ICD-10-CM | POA: Diagnosis not present

## 2021-07-15 DIAGNOSIS — K4090 Unilateral inguinal hernia, without obstruction or gangrene, not specified as recurrent: Secondary | ICD-10-CM | POA: Insufficient documentation

## 2021-07-15 HISTORY — PX: INGUINAL HERNIA REPAIR: SHX194

## 2021-07-15 SURGERY — REPAIR, HERNIA, INGUINAL, ADULT
Anesthesia: Regional | Site: Abdomen | Laterality: Left

## 2021-07-15 MED ORDER — FENTANYL CITRATE (PF) 100 MCG/2ML IJ SOLN
INTRAMUSCULAR | Status: AC
  Administered 2021-07-15: 100 ug via INTRAVENOUS
  Filled 2021-07-15: qty 2

## 2021-07-15 MED ORDER — BUPIVACAINE-EPINEPHRINE 0.25% -1:200000 IJ SOLN
INTRAMUSCULAR | Status: DC | PRN
  Administered 2021-07-15: 20 mL

## 2021-07-15 MED ORDER — FENTANYL CITRATE (PF) 250 MCG/5ML IJ SOLN
INTRAMUSCULAR | Status: AC
  Filled 2021-07-15: qty 5

## 2021-07-15 MED ORDER — AMISULPRIDE (ANTIEMETIC) 5 MG/2ML IV SOLN
10.0000 mg | Freq: Once | INTRAVENOUS | Status: AC
  Administered 2021-07-15: 10 mg via INTRAVENOUS

## 2021-07-15 MED ORDER — ONDANSETRON HCL 4 MG/2ML IJ SOLN
INTRAMUSCULAR | Status: AC
  Filled 2021-07-15: qty 2

## 2021-07-15 MED ORDER — FENTANYL CITRATE (PF) 100 MCG/2ML IJ SOLN
INTRAMUSCULAR | Status: AC
  Filled 2021-07-15: qty 2

## 2021-07-15 MED ORDER — DEXAMETHASONE SODIUM PHOSPHATE 10 MG/ML IJ SOLN
INTRAMUSCULAR | Status: AC
  Filled 2021-07-15: qty 1

## 2021-07-15 MED ORDER — ONDANSETRON HCL 4 MG/2ML IJ SOLN
INTRAMUSCULAR | Status: DC | PRN
  Administered 2021-07-15: 4 mg via INTRAVENOUS

## 2021-07-15 MED ORDER — CELECOXIB 200 MG PO CAPS
400.0000 mg | ORAL_CAPSULE | ORAL | Status: AC
Start: 2021-07-15 — End: 2021-07-15

## 2021-07-15 MED ORDER — MIDAZOLAM HCL 2 MG/2ML IJ SOLN
INTRAMUSCULAR | Status: AC
  Administered 2021-07-15: 2 mg via INTRAVENOUS
  Filled 2021-07-15: qty 2

## 2021-07-15 MED ORDER — CEFAZOLIN SODIUM-DEXTROSE 2-4 GM/100ML-% IV SOLN
2.0000 g | INTRAVENOUS | Status: AC
  Administered 2021-07-15: 2 g via INTRAVENOUS

## 2021-07-15 MED ORDER — CHLORHEXIDINE GLUCONATE CLOTH 2 % EX PADS: 6.0000 | MEDICATED_PAD | Freq: Once | CUTANEOUS | Status: DC

## 2021-07-15 MED ORDER — 0.9 % SODIUM CHLORIDE (POUR BTL) OPTIME
TOPICAL | Status: DC | PRN
  Administered 2021-07-15: 1000 mL

## 2021-07-15 MED ORDER — ACETAMINOPHEN 500 MG PO TABS: 1000.0000 mg | ORAL_TABLET | Freq: Once | ORAL | Status: AC

## 2021-07-15 MED ORDER — BUPIVACAINE-EPINEPHRINE (PF) 0.25% -1:200000 IJ SOLN
INTRAMUSCULAR | Status: AC
  Filled 2021-07-15: qty 30

## 2021-07-15 MED ORDER — PROPOFOL 10 MG/ML IV BOLUS
INTRAVENOUS | Status: AC
  Filled 2021-07-15: qty 20

## 2021-07-15 MED ORDER — PHENYLEPHRINE HCL (PRESSORS) 10 MG/ML IV SOLN
INTRAVENOUS | Status: DC | PRN
  Administered 2021-07-15 (×2): 40 ug via INTRAVENOUS
  Administered 2021-07-15 (×2): 80 ug via INTRAVENOUS

## 2021-07-15 MED ORDER — ORAL CARE MOUTH RINSE: 15.0000 mL | Freq: Once | OROMUCOSAL | Status: AC

## 2021-07-15 MED ORDER — MIDAZOLAM HCL 2 MG/2ML IJ SOLN: 2.0000 mg | Freq: Once | INTRAMUSCULAR | Status: AC

## 2021-07-15 MED ORDER — CELECOXIB 200 MG PO CAPS
ORAL_CAPSULE | ORAL | Status: AC
  Administered 2021-07-15: 400 mg via ORAL
  Filled 2021-07-15: qty 1

## 2021-07-15 MED ORDER — OXYCODONE HCL 5 MG PO TABS: 5.0000 mg | ORAL_TABLET | Freq: Four times a day (QID) | ORAL | 0 refills | Status: DC | PRN

## 2021-07-15 MED ORDER — IBUPROFEN 800 MG PO TABS: 800.0000 mg | ORAL_TABLET | Freq: Three times a day (TID) | ORAL | 0 refills | Status: DC | PRN

## 2021-07-15 MED ORDER — PROPOFOL 10 MG/ML IV BOLUS
INTRAVENOUS | Status: DC | PRN
  Administered 2021-07-15 (×2): 50 mg via INTRAVENOUS
  Administered 2021-07-15: 100 mg via INTRAVENOUS

## 2021-07-15 MED ORDER — FENTANYL CITRATE (PF) 100 MCG/2ML IJ SOLN: 100.0000 ug | Freq: Once | INTRAMUSCULAR | Status: AC

## 2021-07-15 MED ORDER — CHLORHEXIDINE GLUCONATE 0.12 % MT SOLN
15.0000 mL | Freq: Once | OROMUCOSAL | Status: AC
  Administered 2021-07-15: 15 mL via OROMUCOSAL
  Filled 2021-07-15: qty 15

## 2021-07-15 MED ORDER — CELECOXIB 200 MG PO CAPS: 200.0000 mg | ORAL_CAPSULE | Freq: Once | ORAL | Status: DC

## 2021-07-15 MED ORDER — LIDOCAINE 2% (20 MG/ML) 5 ML SYRINGE
INTRAMUSCULAR | Status: DC | PRN
  Administered 2021-07-15: 60 mg via INTRAVENOUS

## 2021-07-15 MED ORDER — ENSURE PRE-SURGERY PO LIQD: 296.0000 mL | Freq: Once | ORAL | Status: DC

## 2021-07-15 MED ORDER — AMISULPRIDE (ANTIEMETIC) 5 MG/2ML IV SOLN
INTRAVENOUS | Status: AC
  Filled 2021-07-15: qty 4

## 2021-07-15 MED ORDER — ROPIVACAINE HCL 5 MG/ML IJ SOLN
INTRAMUSCULAR | Status: DC | PRN
  Administered 2021-07-15: 30 mL

## 2021-07-15 MED ORDER — CEFAZOLIN SODIUM-DEXTROSE 2-4 GM/100ML-% IV SOLN
INTRAVENOUS | Status: AC
  Filled 2021-07-15: qty 100

## 2021-07-15 MED ORDER — LIDOCAINE 2% (20 MG/ML) 5 ML SYRINGE
INTRAMUSCULAR | Status: AC
  Filled 2021-07-15: qty 5

## 2021-07-15 MED ORDER — FENTANYL CITRATE (PF) 250 MCG/5ML IJ SOLN
INTRAMUSCULAR | Status: DC | PRN
  Administered 2021-07-15: 25 ug via INTRAVENOUS
  Administered 2021-07-15: 50 ug via INTRAVENOUS
  Administered 2021-07-15: 25 ug via INTRAVENOUS

## 2021-07-15 MED ORDER — LACTATED RINGERS IV SOLN: INTRAVENOUS | Status: DC

## 2021-07-15 MED ORDER — MIDAZOLAM HCL 2 MG/2ML IJ SOLN
INTRAMUSCULAR | Status: AC
  Filled 2021-07-15: qty 2

## 2021-07-15 MED ORDER — ACETAMINOPHEN 500 MG PO TABS: 1000.0000 mg | ORAL_TABLET | ORAL | Status: DC

## 2021-07-15 MED ORDER — ACETAMINOPHEN 500 MG PO TABS
ORAL_TABLET | ORAL | Status: AC
  Administered 2021-07-15: 1000 mg via ORAL
  Filled 2021-07-15: qty 1

## 2021-07-15 MED ORDER — CHLORHEXIDINE GLUCONATE CLOTH 2 % EX PADS
6.0000 | MEDICATED_PAD | Freq: Once | CUTANEOUS | Status: AC
  Administered 2021-07-15: 6 via TOPICAL

## 2021-07-15 MED ORDER — FENTANYL CITRATE (PF) 100 MCG/2ML IJ SOLN
25.0000 ug | INTRAMUSCULAR | Status: DC | PRN
  Administered 2021-07-15 (×3): 50 ug via INTRAVENOUS

## 2021-07-15 MED ORDER — DEXAMETHASONE SODIUM PHOSPHATE 10 MG/ML IJ SOLN
INTRAMUSCULAR | Status: DC | PRN
  Administered 2021-07-15: 10 mg via INTRAVENOUS

## 2021-07-15 SURGICAL SUPPLY — 40 items
BAG COUNTER SPONGE SURGICOUNT (BAG) ×2 IMPLANT
BENZOIN TINCTURE PRP APPL 2/3 (GAUZE/BANDAGES/DRESSINGS) IMPLANT
BLADE SURG 15 STRL LF DISP TIS (BLADE) ×1 IMPLANT
BLADE SURG 15 STRL SS (BLADE) ×1
CHLORAPREP W/TINT 26 (MISCELLANEOUS) ×2 IMPLANT
COVER SURGICAL LIGHT HANDLE (MISCELLANEOUS) ×2 IMPLANT
DERMABOND ADVANCED (GAUZE/BANDAGES/DRESSINGS) ×1
DERMABOND ADVANCED .7 DNX12 (GAUZE/BANDAGES/DRESSINGS) ×1 IMPLANT
DRAIN PENROSE 0.5X18 (DRAIN) IMPLANT
DRAPE LAPAROTOMY TRNSV 102X78 (DRAPES) ×2 IMPLANT
DRAPE UTILITY 15X26 TOWEL STRL (DRAPES) ×2 IMPLANT
DRSG TELFA PLUS 4X6 ADH ISLAND (GAUZE/BANDAGES/DRESSINGS) IMPLANT
ELECT REM PT RETURN 15FT ADLT (MISCELLANEOUS) ×2 IMPLANT
GAUZE SPONGE 4X4 12PLY STRL (GAUZE/BANDAGES/DRESSINGS) IMPLANT
GLOVE SURG POLYISO LF SZ7 (GLOVE) ×2 IMPLANT
GLOVE SURG UNDER POLY LF SZ7 (GLOVE) ×2 IMPLANT
GOWN STRL REUS W/ TWL LRG LVL3 (GOWN DISPOSABLE) ×1 IMPLANT
GOWN STRL REUS W/ TWL XL LVL3 (GOWN DISPOSABLE) IMPLANT
GOWN STRL REUS W/TWL LRG LVL3 (GOWN DISPOSABLE) ×1
GOWN STRL REUS W/TWL XL LVL3 (GOWN DISPOSABLE)
KIT BASIN OR (CUSTOM PROCEDURE TRAY) ×2 IMPLANT
KIT TURNOVER KIT A (KITS) IMPLANT
MARKER SKIN DUAL TIP RULER LAB (MISCELLANEOUS) ×2 IMPLANT
MESH HERNIA 3X6 (Mesh General) ×1 IMPLANT
NEEDLE HYPO 22GX1.5 SAFETY (NEEDLE) ×2 IMPLANT
PACK BASIC VI WITH GOWN DISP (CUSTOM PROCEDURE TRAY) ×2 IMPLANT
PENCIL SMOKE EVACUATOR (MISCELLANEOUS) ×2 IMPLANT
SPIKE FLUID TRANSFER (MISCELLANEOUS) ×2 IMPLANT
SPONGE T-LAP 4X18 ~~LOC~~+RFID (SPONGE) ×2 IMPLANT
STRIP CLOSURE SKIN 1/2X4 (GAUZE/BANDAGES/DRESSINGS) IMPLANT
SUT MNCRL AB 4-0 PS2 18 (SUTURE) ×2 IMPLANT
SUT PROLENE 2 0 CT2 30 (SUTURE) ×4 IMPLANT
SUT VIC AB 3-0 SH 18 (SUTURE) ×2 IMPLANT
SUT VIC AB 3-0 SH 27 (SUTURE) ×2
SUT VIC AB 3-0 SH 27XBRD (SUTURE) ×2 IMPLANT
SYR BULB IRRIG 60ML STRL (SYRINGE) ×2 IMPLANT
SYR CONTROL 10ML LL (SYRINGE) ×2 IMPLANT
TOWEL OR 17X26 10 PK STRL BLUE (TOWEL DISPOSABLE) ×2 IMPLANT
TOWEL OR NON WOVEN STRL DISP B (DISPOSABLE) ×2 IMPLANT
YANKAUER SUCT BULB TIP 10FT TU (MISCELLANEOUS) IMPLANT

## 2021-07-15 NOTE — Op Note (Signed)
Preop diagnosis: left inguinal hernia ? ?Postop diagnosis: left inguinal hernia ? ?Procedure: open Left inguinal hernia repair with mesh ? ?Surgeon: Feliciana Rossetti, M.D. ? ?Asst: none ? ?Anesthesia: Gen.  ? ?Indications for procedure: George Munoz is a 51 y.o. male with symptoms of pain and enlarging Left inguinal hernia(s). After discussing risks, alternatives and benefits he decided on open repair and was brought to day surgery for repair. ? ?Description of procedure: The patient was brought into the operative suite, placed supine. Anesthesia was administered with endotracheal tube. Patient was strapped in place. The patient was prepped and draped in the usual sterile fashion. ? ?The anterior superior iliac spine and pubic tubercle were identified on the Left side. An incision was made 1cm above the connecting line, representative of the location of the inguinal ligament. The subcutaneous tissue was bluntly dissected, scarpa's fascia was dissected away. The external abdominal oblique fascia was identified and sharply opened down to the external inguinal ring. The conjoint tendon and inguinal ligament were identified. The cord structures and sac were dissected free of the surrounding tissue in 360 degrees. A penrose drain was used to encircle the contents. The cremasteric fibers were dissected free of the contents of the cord and hernia sac. The cord structures (vessels and vas deferens) were identified and carefully dissected away from the hernia sac. The hernia sac contained colon and was opened and contents reduced into the peritoneal space. The sac was divided and closed with running 3-0 vicryl. The hernia sac was dissected down to the internal inguinal ring. Preperitoneal fat was identified showing appropriate dissection. The sac was then reduced into the preperitoneal space. The hernia was indirect. A 3x6 Bard mesh was then used to close the defect and reinforce the floor. The mesh was sutured to the  lacunar ligament and inguinal ligament using a 2-0 prolene in running fashion. Next the superior edge of the mesh was sutured to the conjoined tendon using a 2-0 running Prolene. An additional 2-0 Prolene was used to suture the tail ends of the mesh together re-creating the deep ring. Cord structures are running in a neutral position through the mesh. Next the external abdominal oblique fascia was closed with a 2-0 Vicryl in interrupted fashion to re-create the external inguinal ring. Scarpa's fascia was closed with 3-0 Vicryl in running fashion. Skin was closed with a 4-0 Monocryl subcuticular stitch in running fashion. Dermabond place for dressing. Patient woke from anesthesia and brought to PACU in stable condition. All counts are correct. ? ? ? ?Findings: left indirect inguinal hernia ? ?Specimen: none ? ?Blood loss: 30 ml ? ?Local anesthesia: 20 ml Marcaine with Epinephrine ? ?Complications: none ? ?Implant: 3 x 6 in Bard Mesh ? ?Feliciana Rossetti, M.D. ?General, Bariatric, & Minimally Invasive Surgery ?[facility], [place] ?1:57 PM ?07/15/2021 ? ?

## 2021-07-15 NOTE — Discharge Instructions (Signed)
[facility] _______[facility]  UMBILICAL OR INGUINAL HERNIA REPAIR: POST OP INSTRUCTIONS  Always review your discharge instruction sheet given to you by the facility where your surgery was performed. IF YOU HAVE DISABILITY OR FAMILY LEAVE FORMS, YOU MUST BRING THEM TO THE OFFICE FOR PROCESSING.   DO NOT GIVE THEM TO YOUR DOCTOR.  1. A  prescription for pain medication may be given to you upon discharge.  Take your pain medication as prescribed, if needed.  If narcotic pain medicine is not needed, then you may take acetaminophen (Tylenol) or ibuprofen (Advil) as needed. 2. Take your usually prescribed medications unless otherwise directed. If you need a refill on your pain medication, please contact your pharmacy.  They will contact our office to request authorization. Prescriptions will not be filled after 5 pm or on week-ends. 3. You should follow a light diet the first 24 hours after arrival home, such as soup and crackers, etc.  Be sure to include lots of fluids daily.  Resume your normal diet the day after surgery. 4.Most patients will experience some swelling and bruising around the umbilicus or in the groin and scrotum.  Ice packs and reclining will help.  Swelling and bruising can take several days to resolve.  6. It is common to experience some constipation if taking pain medication after surgery.  Increasing fluid intake and taking a stool softener (such as Colace) will usually help or prevent this problem from occurring.  A mild laxative (Milk of Magnesia or Miralax) should be taken according to package directions if there are no bowel movements after 48 hours. 7. Unless discharge instructions indicate otherwise, you may remove your bandages 24-48 hours after surgery, and you may shower at that time.  You may have steri-strips (small skin tapes) in place directly over the incision.  These strips should be left on the skin for 7-10 days.  If your surgeon used skin glue on the  incision, you may shower in 24 hours.  The glue will flake off over the next 2-3 weeks.  Any sutures or staples will be removed at the office during your follow-up visit. 8. ACTIVITIES:  You may resume regular (light) daily activities beginning the next day--such as daily self-care, walking, climbing stairs--gradually increasing activities as tolerated.  You may have sexual intercourse when it is comfortable.  Refrain from any heavy lifting or straining until approved by your doctor.  a.You may drive when you are no longer taking prescription pain medication, you can comfortably wear a seatbelt, and you can safely maneuver your car and apply brakes. b.RETURN TO WORK:   _____________________________________________  9.You should see your doctor in the office for a follow-up appointment approximately 2-3 weeks after your surgery.  Make sure that you call for this appointment within a day or two after you arrive home to insure a convenient appointment time. 10.OTHER INSTRUCTIONS: _________________________    _____________________________________  WHEN TO CALL YOUR DOCTOR: Fever over 101.0 Inability to urinate Nausea and/or vomiting Extreme swelling or bruising Continued bleeding from incision. Increased pain, redness, or drainage from the incision  The clinic staff is available to answer your questions during regular business hours.  Please don't hesitate to call and ask to speak to one of the nurses for clinical concerns.  If you have a medical emergency, go to the nearest emergency room or call 911.  A surgeon from [facility] is always on call at the hospital   [address], [address],   [place], [place]  [address] ?  [address] [address], [place],    [address] [phone] ? [phone] ? FAX [phone] Web site: [email or web address]  

## 2021-07-15 NOTE — Anesthesia Procedure Notes (Signed)
Anesthesia Regional Block: TAP block  ? ?Pre-Anesthetic Checklist: , timeout performed,  Correct Patient, Correct Site, Correct Laterality,  Correct Procedure, Correct Position, site marked,  Risks and benefits discussed,  Surgical consent,  Pre-op evaluation,  At surgeon's request and post-op pain management ? ?Laterality: Left ? ?Prep: Dura Prep     ?  ?Needles:  ?Injection technique: Single-shot ? ?Needle Type: Echogenic Stimulator Needle   ? ? ?Needle Length: 10cm  ?Needle Gauge: 20  ? ? ? ?Additional Needles: ? ? ?Procedures:,,,, ultrasound used (permanent image in chart),,    ?Narrative:  ?Start time: 07/15/2021 12:03 PM ?End time: 07/15/2021 12:07 PM ?Injection made incrementally with aspirations every 5 mL. ? ?Performed by: Personally  ?Anesthesiologist: Darral Dash, DO ? ?Additional Notes: ?Patient identified. Risks/Benefits/Options discussed with patient including but not limited to bleeding, infection, nerve damage, failed block, incomplete pain control. Patient expressed understanding and wished to proceed. All questions were answered. Sterile technique was used throughout the entire procedure. Please see nursing notes for vital signs. Aspirated in 5cc intervals with injection for negative confirmation. Patient was given instructions on fall risk and not to get out of bed. All questions and concerns addressed with instructions to call with any issues or inadequate analgesia.   ?  ? ? ? ? ?

## 2021-07-15 NOTE — H&P (Signed)
Chief Complaint: Left Inguinal Hernia ? ? ?History of Present Illness: ?George Munoz is a 51 y.o. male who is seen today as an office consultation at the request of Dr. Pasty Arch for evaluation of Left Inguinal Hernia ?.  ? ?He first noticed the hernia 1 month ago when he caught a heavy piece of equipment from falling off a truck. The area was bulged and hurt a lot. He went to the ED that night and had it reduced. Since then he reduces it at night which causes some nausea. He has pain in the area with activity. He denies other nausea or vomiting or bowel habit change. ? ?He does smoke ?He does not have diabetes ?He has no history of hernias ? ?Review of Systems: ?A complete review of systems was obtained from the patient. I have reviewed this information and discussed as appropriate with the patient. See HPI as well for other ROS. ? ?Review of Systems  ?Constitutional: Negative.  ?HENT: Negative.  ?Eyes: Negative.  ?Respiratory: Negative.  ?Cardiovascular: Negative.  ?Gastrointestinal: Negative.  ?Genitourinary: Negative.  ?Musculoskeletal: Negative.  ?Skin: Negative.  ?Neurological: Negative.  ?Endo/Heme/Allergies: Negative.  ?Psychiatric/Behavioral: Negative.  ? ? ?Medical History: ?Past Medical History:  ?Diagnosis Date  ? Anxiety  ? CHF (congestive heart failure) (CMS-HCC)  ? ?There is no problem list on file for this patient. ? ?Past Surgical History:  ?Procedure Laterality Date  ? Coronary Artery Bypass Grafting times three, using left mammary artery and right greater saphenous vein harvested endoscopically 11/25/2018  ?Dr. Kipp Brood  ? ? ?No Known Allergies ? ?Current Outpatient Medications on File Prior to Visit  ?Medication Sig Dispense Refill  ? aspirin 81 MG EC tablet Take 81 mg by mouth once daily  ? omega-3 acid ethyl esters (LOVAZA) 1 gram capsule Take by mouth  ? pantoprazole (PROTONIX) 40 MG DR tablet Take 40 mg by mouth once daily  ? rosuvastatin (CRESTOR) 40 MG tablet Take 40 mg by mouth once  daily  ? ?No current facility-administered medications on file prior to visit.  ? ?Family History  ?Problem Relation Age of Onset  ? High blood pressure (Hypertension) Mother  ? Coronary Artery Disease (Blocked arteries around heart) Mother  ? Diabetes Mother  ? Coronary Artery Disease (Blocked arteries around heart) Father  ? Obesity Sister  ? Diabetes Sister  ? ? ?Social History  ? ?Tobacco Use  ?Smoking Status Every Day  ? Types: Cigarettes  ?Smokeless Tobacco Never  ? ? ?Social History  ? ?Socioeconomic History  ? Marital status: Single  ?Tobacco Use  ? Smoking status: Every Day  ?Types: Cigarettes  ? Smokeless tobacco: Never  ?Substance and Sexual Activity  ? Alcohol use: Never  ? Drug use: Never  ? ?Objective:  ? ?Vitals:  ?06/28/21 1549  ?BP: (!) 134/90  ?Pulse: 72  ?Temp: 37 ?C (98.6 ?F)  ?SpO2: 98%  ?Weight: 88.4 kg (194 lb 12.8 oz)  ?Height: 175.3 cm (5\' 9" )  ? ?Body mass index is 28.77 kg/m?. ? ?Physical Exam ?Constitutional:  ?Appearance: Normal appearance.  ?HENT:  ?Head: Normocephalic and atraumatic.  ?Pulmonary:  ?Effort: Pulmonary effort is normal.  ?Abdominal:  ?Comments: Moderate left inguinal hernia  ?Musculoskeletal:  ?General: Normal range of motion.  ?Cervical back: Normal range of motion.  ?Neurological:  ?General: No focal deficit present.  ?Mental Status: He is alert and oriented to person, place, and time. Mental status is at baseline.  ?Psychiatric:  ?Mood and Affect: Mood normal.  ?Behavior: Behavior normal.  ?Thought  Content: Thought content normal.  ? ? ? ?Labs, Imaging and Diagnostic Testing: ?I reviewed notes by Dr. Verner Chol, I reviewed [place] images confirming left inguinal hernia ? ?Assessment and Plan:  ?There are no diagnoses linked to this encounter.  ? ?We discussed etiology of hernias and how they can cause pain. We discussed options for inguinal hernia repair vs observation. We discussed details of the surgery of general anesthesia, surgical approach and incisions, dissecting the  sack away from vas deference, testicular vessels and nerves and placement of mesh. We discussed risks of bleeding, infection, recurrence, injury to vas deference, testicular vessels, nerve injury, and chronic pain. He showed good understanding and wanted proceed with open left inguinal hernia repair as outpatient.  ? ?He had a heart bypass 2 years ago and has no angina like symptoms  ? ?

## 2021-07-15 NOTE — Anesthesia Postprocedure Evaluation (Signed)
Anesthesia Post Note ? ?Patient: George Munoz ? ?Procedure(s) Performed: OPEN LEFT INGUINAL HERNIA REPAIR WITH MESH (Left: Abdomen) ? ?  ? ?Patient location during evaluation: PACU ?Anesthesia Type: Regional and General ?Level of consciousness: awake and alert ?Pain management: pain level controlled ?Vital Signs Assessment: post-procedure vital signs reviewed and stable ?Respiratory status: spontaneous breathing, nonlabored ventilation, respiratory function stable and patient connected to nasal cannula oxygen ?Cardiovascular status: blood pressure returned to baseline and stable ?Postop Assessment: no apparent nausea or vomiting ?Anesthetic complications: no ? ? ?No notable events documented. ? ?Last Vitals:  ?Vitals:  ? 07/15/21 1455 07/15/21 1510  ?BP: 123/79 131/84  ?Pulse: 78 83  ?Resp: 16 14  ?Temp:  36.5 ?C  ?SpO2: 98% 98%  ?  ?Last Pain:  ?Vitals:  ? 07/15/21 1510  ?TempSrc:   ?PainSc: 3   ? ? ?  ?  ?  ?  ?  ?  ? ?Nelle Don Keeshia Sanderlin ? ? ? ? ?

## 2021-07-15 NOTE — Anesthesia Procedure Notes (Addendum)
Procedure Name: LMA Insertion ?Date/Time: 07/15/2021 12:44 PM ?Performed by: Shary Decamp, CRNA ?Pre-anesthesia Checklist: Patient identified, Patient being monitored, Timeout performed, Emergency Drugs available and Suction available ?Patient Re-evaluated:Patient Re-evaluated prior to induction ?Oxygen Delivery Method: Circle system utilized ?Preoxygenation: Pre-oxygenation with 100% oxygen ?Induction Type: IV induction ?Ventilation: Mask ventilation without difficulty ?LMA: LMA inserted ?LMA Size: 5.0 ?Number of attempts: 1 ?Airway Equipment and Method: Stylet ?Placement Confirmation: positive ETCO2 and breath sounds checked- equal and bilateral ?Tube secured with: Tape ?Dental Injury: Teeth and Oropharynx as per pre-operative assessment  ? ? ? ? ?

## 2021-07-15 NOTE — Anesthesia Preprocedure Evaluation (Addendum)
Anesthesia Evaluation  ?Patient identified by MRN, date of birth, ID band ?Patient awake ? ? ? ?Reviewed: ?Allergy & Precautions, NPO status , Patient's Chart, lab work & pertinent test results ? ?Airway ?Mallampati: II ? ?TM Distance: >3 FB ?Neck ROM: Full ? ? ? Dental ? ?(+) Poor Dentition ?  ?Pulmonary ?neg pulmonary ROS, Current Smoker,  ?  ?Pulmonary exam normal ? ? ? ? ? ? ? Cardiovascular ?hypertension, Pt. on medications ?+ CAD, + Past MI and + CABG (07/20)  ? ?Rhythm:Regular Rate:Normal ? ? ?  ?Neuro/Psych ?Anxiety negative neurological ROS ?   ? GI/Hepatic ?Neg liver ROS, GERD  Medicated,  ?Endo/Other  ?negative endocrine ROS ? Renal/GU ?negative Renal ROS  ?negative genitourinary ?  ?Musculoskeletal ?negative musculoskeletal ROS ?(+)  ? Abdominal ?Normal abdominal exam  (+)   ?Peds ? Hematology ?negative hematology ROS ?(+)   ?Anesthesia Other Findings ? ? Reproductive/Obstetrics ? ?  ? ? ? ? ? ? ? ? ? ? ? ? ? ?  ?  ? ? ? ? ? ? ? ?Anesthesia Physical ?Anesthesia Plan ? ?ASA: 3 ? ?Anesthesia Plan: General and Regional  ? ?Post-op Pain Management: Tylenol PO (pre-op)*, Celebrex PO (pre-op)* and Regional block*  ? ?Induction: Intravenous ? ?PONV Risk Score and Plan: Ondansetron, Dexamethasone, Midazolam and Treatment may vary due to age or medical condition ? ?Airway Management Planned: Mask and LMA ? ?Additional Equipment: None ? ?Intra-op Plan:  ? ?Post-operative Plan: Extubation in OR ? ?Informed Consent: I have reviewed the patients History and Physical, chart, labs and discussed the procedure including the risks, benefits and alternatives for the proposed anesthesia with the patient or authorized representative who has indicated his/her understanding and acceptance.  ? ? ? ?Dental advisory given ? ?Plan Discussed with: CRNA ? ?Anesthesia Plan Comments: (- DOS note: cardiac clearance obtained. S/p CABG 2020 for arrest. PAF at the time without recurrence on 30 day event  monitor. Optimized.  ?Lab Results ?     Component                Value               Date                 ?     WBC                      8.7                 07/09/2021           ?     HGB                      16.5                07/09/2021           ?     HCT                      50.7                07/09/2021           ?     MCV                      89.1                07/09/2021           ?  PLT                      250                 07/09/2021           ?Lab Results ?     Component                Value               Date                 ?     NA                       136                 07/09/2021           ?     K                        4.2                 07/09/2021           ?     CO2                      26                  07/09/2021           ?     GLUCOSE                  90                  07/09/2021           ?     BUN                      14                  07/09/2021           ?     CREATININE               0.77                07/09/2021           ?     CALCIUM                  9.0                 07/09/2021           ?     GFRNONAA                 >60                 07/09/2021          ? ?Echo 11/22/18: ??1. The left ventricle has normal systolic function with an ejection fraction of 60-65%. The cavity size was normal. Left ventricular diastolic parameters were normal. ??2. The right ventricle has normal systolic function. The cavity was normal. There is no increase ?? ?Cardiac cath 11/21/18: ?? Mid RCA lesion is 40% stenosed. ?? Prox Cx lesion is 99% stenosed. ?? 1st Mrg lesion is 99% stenosed. ?? 2nd  Diag lesion is 90% stenosed. ?? Ost LM to Dist LM lesion is 50% stenosed. ?? Prox RCA lesion is 50% stenosed. ?? There is mild left ventricular systolic dysfunction. ?? LV end diastolic pressure is normal. ?? The left ventricular ejection fraction is 45-50% by visual estimate. ?? There is no mitral valve regurgitation. ?? Mid LAD lesion is 95% stenosed. ?? ?1. Moderate distal left main  stenosis ?2. Severe mid LAD stenosis ?3. Severe mid Circumflex stenosis involving the ostium of a very large obtuse marginal branch ?4. Moderate mid RCA stenosis ?5. Mild segmental LV systolic dysfunction (LVEF=45-50%).  ?6. Post cardiac arrest (ventricular fibrillation) ?7. Rapid atrial fibrillation)  ? ? ? ? ? ?Anesthesia Quick Evaluation ? ?

## 2021-07-15 NOTE — Transfer of Care (Signed)
Immediate Anesthesia Transfer of Care Note ? ?Patient: George Munoz ? ?Procedure(s) Performed: OPEN LEFT INGUINAL HERNIA REPAIR WITH MESH (Left: Abdomen) ? ?Patient Location: PACU ? ?Anesthesia Type:General ? ?Level of Consciousness: drowsy, patient cooperative and responds to stimulation ? ?Airway & Oxygen Therapy: Patient Spontanous Breathing and Patient connected to nasal cannula oxygen ? ?Post-op Assessment: Report given to RN, Post -op Vital signs reviewed and stable and Patient moving all extremities X 4 ? ?Post vital signs: Reviewed and stable ? ?Last Vitals:  ?Vitals Value Taken Time  ?BP 131/83 07/15/21 1410  ?Temp 36.3 ?C 07/15/21 1410  ?Pulse 77 07/15/21 1411  ?Resp 15 07/15/21 1411  ?SpO2 100 % 07/15/21 1411  ?Vitals shown include unvalidated device data. ? ?Last Pain:  ?Vitals:  ? 07/15/21 1200  ?TempSrc:   ?PainSc: 0-No pain  ?   ? ?Patients Stated Pain Goal: 2 (07/15/21 1110) ? ?Complications: No notable events documented. ?

## 2021-07-16 ENCOUNTER — Encounter (HOSPITAL_COMMUNITY): Payer: Self-pay | Admitting: General Surgery

## 2021-08-01 ENCOUNTER — Other Ambulatory Visit: Payer: Medicaid Other

## 2021-08-09 ENCOUNTER — Other Ambulatory Visit: Payer: Medicaid Other | Admitting: *Deleted

## 2021-08-09 DIAGNOSIS — E7800 Pure hypercholesterolemia, unspecified: Secondary | ICD-10-CM

## 2021-08-09 DIAGNOSIS — I2510 Atherosclerotic heart disease of native coronary artery without angina pectoris: Secondary | ICD-10-CM | POA: Diagnosis not present

## 2021-08-09 LAB — LIPID PANEL
Chol/HDL Ratio: 3.3 ratio (ref 0.0–5.0)
Cholesterol, Total: 123 mg/dL (ref 100–199)
HDL: 37 mg/dL — ABNORMAL LOW (ref >39)
LDL Chol Calc (NIH): 66 mg/dL (ref 0–99)
Triglycerides: 109 mg/dL (ref 0–149)
VLDL Cholesterol Cal: 20 mg/dL (ref 5–40)

## 2021-10-20 ENCOUNTER — Other Ambulatory Visit: Payer: Self-pay | Admitting: Cardiovascular Disease

## 2022-02-24 IMAGING — US US SCROTUM W/ DOPPLER COMPLETE
1 series · 13 of 25 positions shown · non-contrast
Comparison: 07/30/2020

CLINICAL DATA: LEFT testicular pain and swelling after heavy
lifting at work

EXAM:
SCROTAL ULTRASOUND
DOPPLER ULTRASOUND OF THE TESTICLES
TECHNIQUE: Complete ultrasound examination of the testicles, epididymis, and
other scrotal structures was performed. Color and spectral Doppler
ultrasound were also utilized to evaluate blood flow to the
testicles.

[Series 1: us scrotum w/doppler · 13 of 73 slices shown]
[im 1/73]
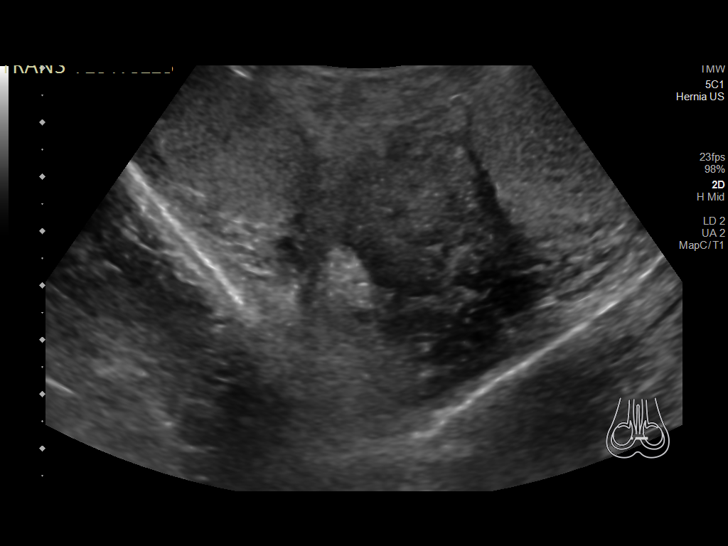
[im 7/73]
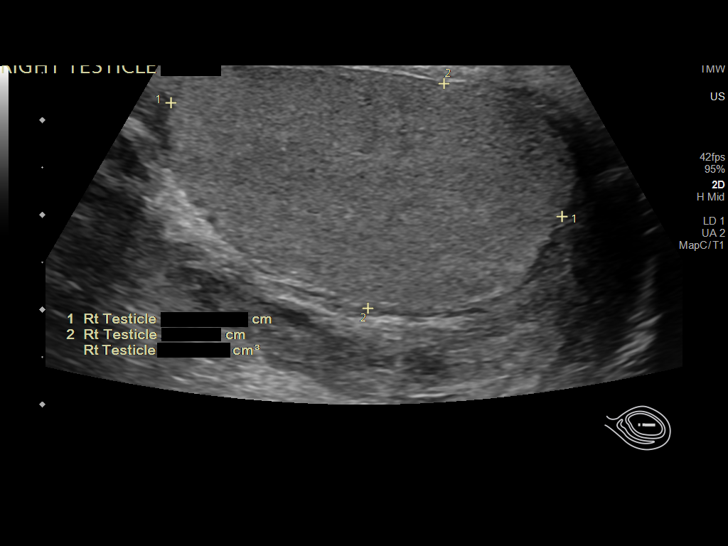
[im 13/73]
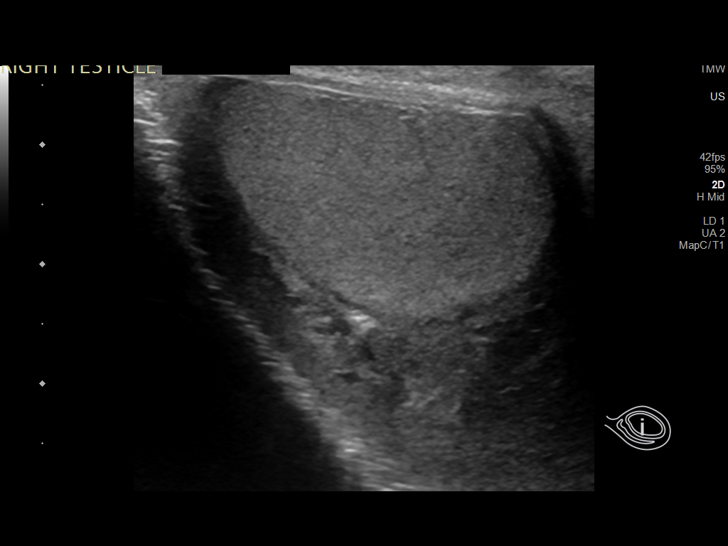
[im 19/73]
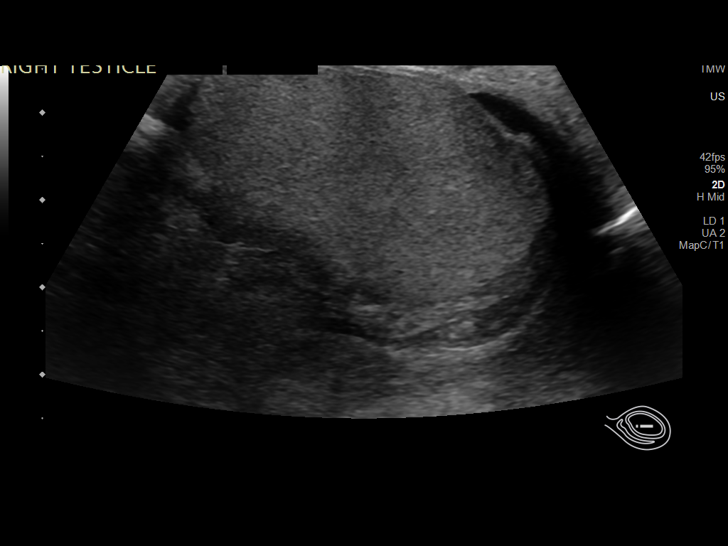
[im 25/73]
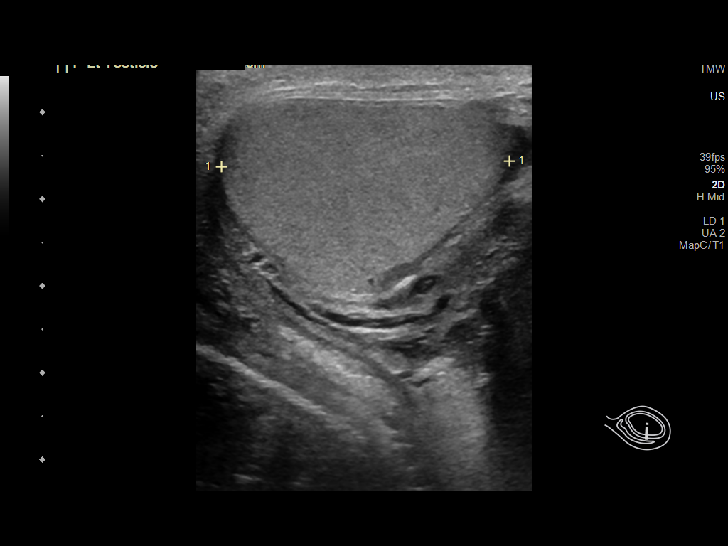
[im 31/73]
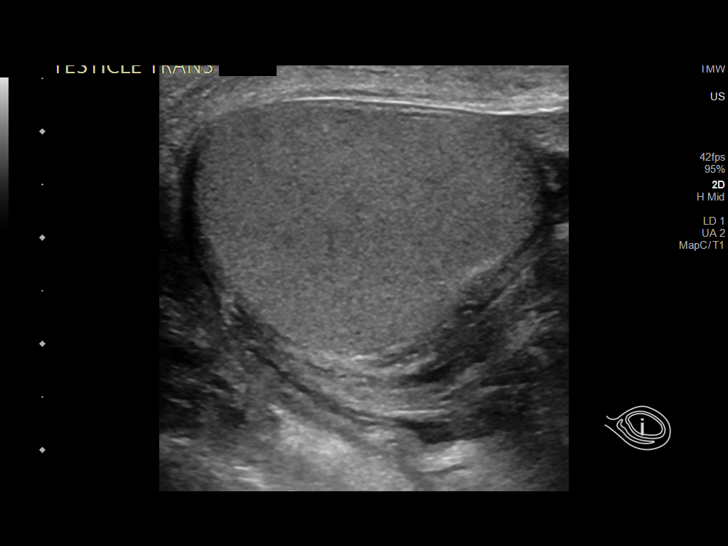
[im 37/73]
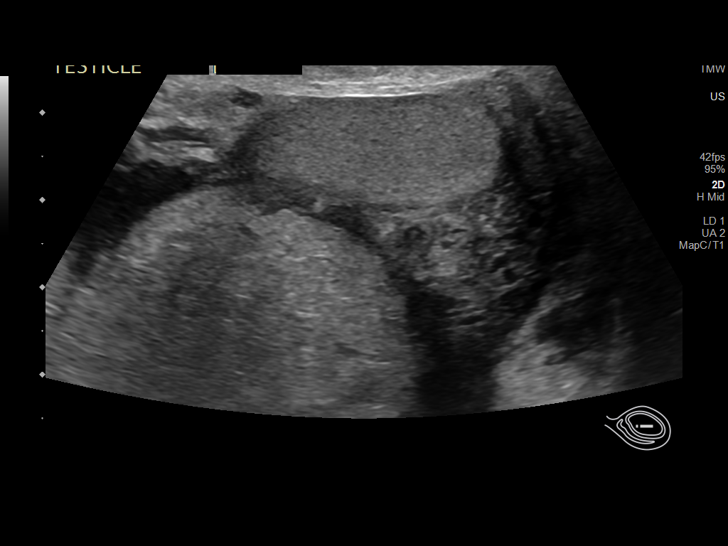
[im 43/73]
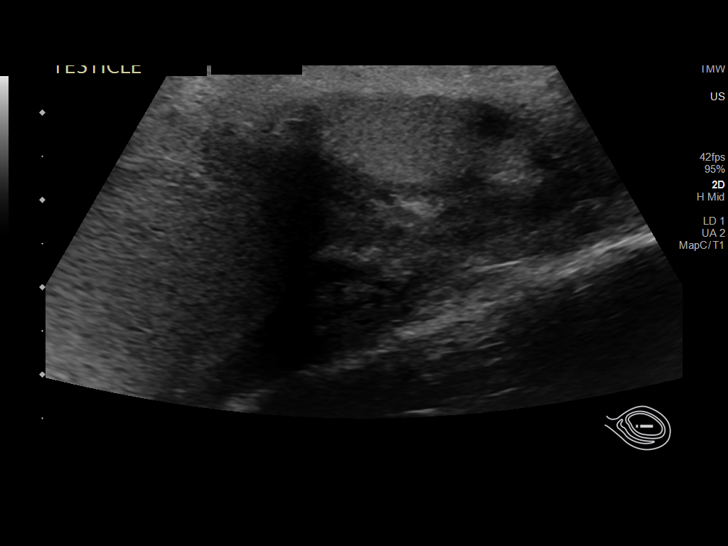
[im 49/73]
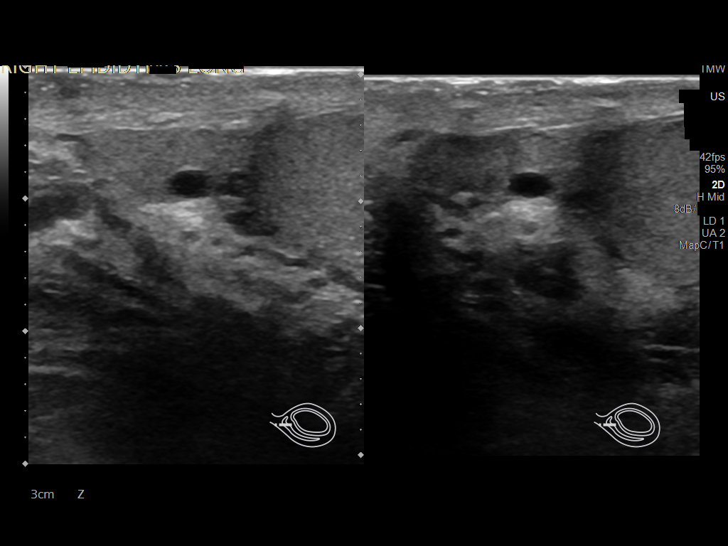
[im 55/73]
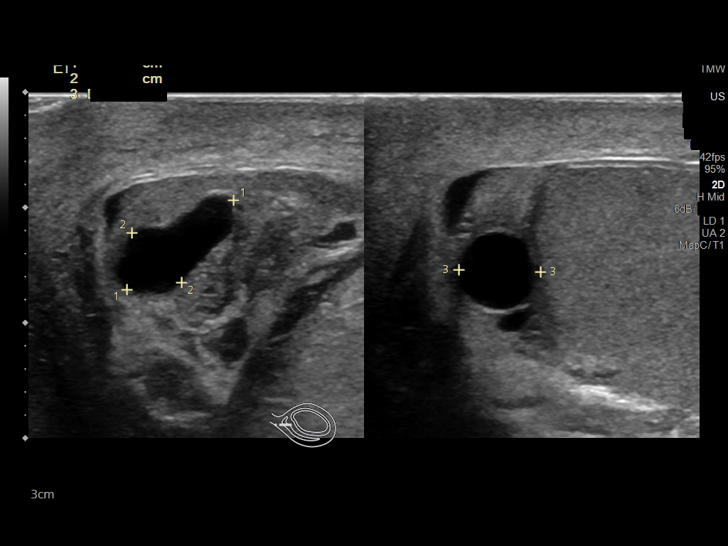
[im 61/73]
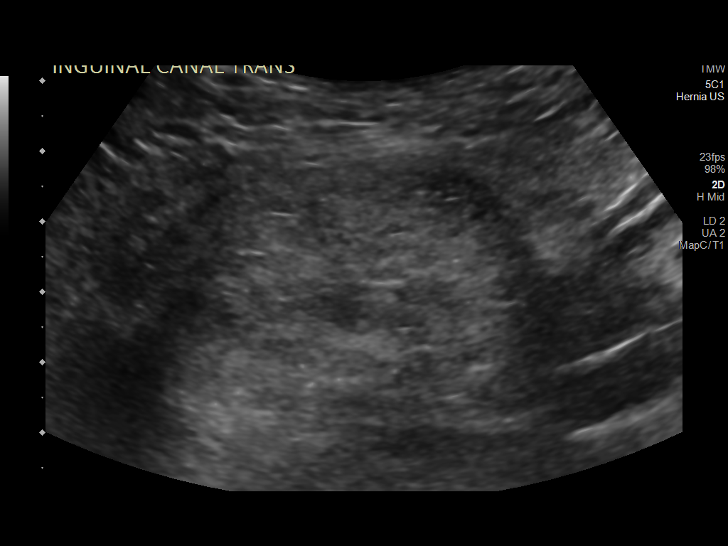
[im 67/73]
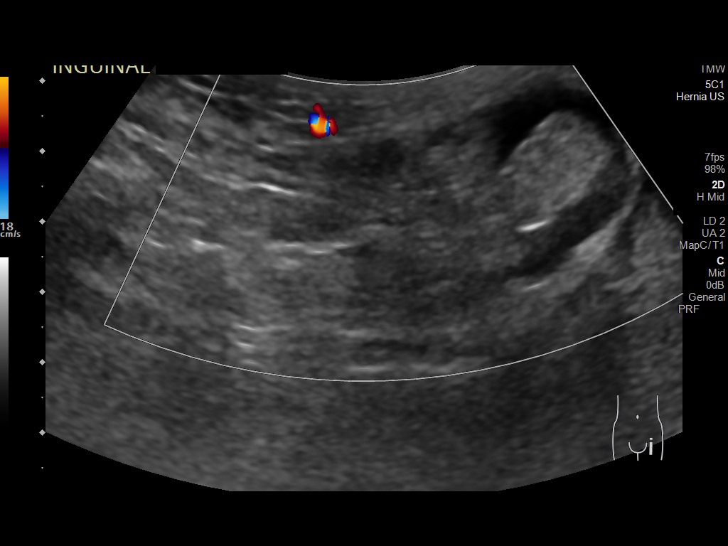
[im 73/73]
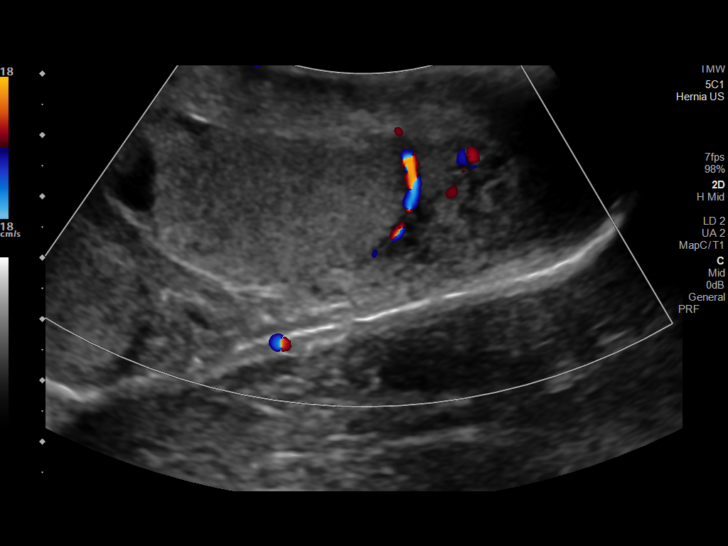

[13 of 25 positions shown; findings below may reference images not displayed]

FINDINGS: Right testicle

Measurements: 4.3 x 2.5 x 2.9 cm. Normal echogenicity without mass
or calcification. Internal blood flow present on color Doppler
imaging.

Left testicle

Measurements: 4.3 x 2.4 x 3.3 cm. Normal echogenicity without mass
or calcification. Internal blood flow present on color Doppler
imaging.

Right epididymis: Small cyst at head of epididymis measuring 3 x 2 x
4 mm

Left epididymis: Cyst at head of LEFT epididymis 12 x 6 x 7 mm
question epididymal cyst versus spermatocele.

Hydrocele:  Trace BILATERAL hydroceles

Varicocele:  Absent bilaterally

Pulsed Doppler interrogation of both testes demonstrates normal low
resistance arterial and venous waveforms bilaterally.

LEFT inguinal hernia identified containing fat, extending into LEFT
scrotum.
IMPRESSION: Normal appearing testes without evidence of mass or torsion.

Small cysts at the head of the epididymi bilaterally, 12 mm greatest
size on LEFT, question epididymal cyst versus spermatocele.

LEFT inguinal hernia containing fat, extending into the LEFT
scrotum.

## 2022-06-20 ENCOUNTER — Ambulatory Visit
Admission: EM | Admit: 2022-06-20 | Discharge: 2022-06-20 | Disposition: A | Discharge status: Home / Self Care | Payer: Medicaid Other | Attending: Physician Assistant | Admitting: Physician Assistant

## 2022-06-20 DIAGNOSIS — J069 Acute upper respiratory infection, unspecified: Secondary | ICD-10-CM | POA: Diagnosis not present

## 2022-06-20 DIAGNOSIS — J0100 Acute maxillary sinusitis, unspecified: Secondary | ICD-10-CM

## 2022-06-20 MED ORDER — AZITHROMYCIN 250 MG PO TABS: ORAL_TABLET | ORAL | 0 refills | Status: DC

## 2022-06-20 MED ORDER — FLUTICASONE PROPIONATE 50 MCG/ACT NA SUSP: 2.0000 | Freq: Every day | NASAL | 0 refills | Status: DC

## 2022-06-20 MED ORDER — PREDNISONE 10 MG PO TABS: 10.0000 mg | ORAL_TABLET | Freq: Three times a day (TID) | ORAL | 0 refills | Status: DC

## 2022-06-20 NOTE — ED Provider Notes (Signed)
[facility]    CSN: [id number] Arrival date & time: 06/20/22  1431      History   Chief Complaint Chief Complaint  Patient presents with   Sore Throat   Shortness of Breath   Cough   Otalgia    HPI George Munoz is a 52 y.o. male.   52 year old male presents with sinus congestion sore throat.  Patient indicates for the past 3 days he has been having upper respiratory congestion with rhinitis, postnasal drip, production being thick yellow-green.  Patient indicates he is also having right frontal and maxillary sinus pressure and pain.  He indicates over the past 2 days have having intermittent sore throat and painful swallowing.  He also indicates that he started with having mild congestion with chest production being yellow and thick.  He is without fever, chills, wheezing, nausea or vomiting.  He indicates he has been taking OTC medications which is given minimal relief from his symptoms.   Sore Throat Associated symptoms include shortness of breath.  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   NSTEMI (non-ST elevated myocardial infarction) ([place]) 11/21/2018   Atrial fibrillation with RVR ([place])    Acute respiratory failure ([place])    Endotracheally intubated    Cardiac arrest [facility])     Past Surgical History:  Procedure Laterality Date   CLIPPING OF ATRIAL APPENDAGE N/A 11/25/2018   Procedure: CLIPPING OF ATRIAL APPENDAGE;  Surgeon:  Lajuana Matte, MD;  Location: [place];  Service: Open Heart Surgery;  Laterality: N/A;  FLOW TRACK   CORONARY ARTERY BYPASS GRAFT N/A 11/25/2018   Procedure: CORONARY ARTERY BYPASS GRAFTING (CABG) times three, using left mammary artery and right greater saphenous vein harvested endoscopically;  Surgeon: Lajuana Matte, MD;  Location: [place];  Service: Open Heart Surgery;  Laterality: N/A;   INGUINAL HERNIA REPAIR Left 07/15/2021   Procedure: OPEN LEFT INGUINAL HERNIA REPAIR WITH MESH;  Surgeon: Kinsinger, Arta Bruce, MD;  Location: [place];  Service: General;  Laterality: Left;   LEFT HEART CATH AND CORONARY ANGIOGRAPHY N/A 11/21/2018   Procedure: LEFT HEART CATH AND CORONARY ANGIOGRAPHY;  Surgeon: Burnell Blanks, MD;  Location: [place] [facility];  Service: Cardiovascular;  Laterality: N/A;   TEE WITHOUT CARDIOVERSION N/A 11/25/2018   Procedure: TRANSESOPHAGEAL ECHOCARDIOGRAM (TEE);  Surgeon: Lajuana Matte, MD;  Location: [place];  Service: Open Heart Surgery;  Laterality: N/A;       Home Medications    Prior to Admission medications   Medication Sig Start Date End Date Taking? Authorizing Provider  azithromycin (ZITHROMAX Z-PAK) 250 MG tablet 2 tablets initially and then 1 tablet daily until completed. 06/20/22  Yes Nyoka Lint, PA-C  fluticasone [facility]) 50 MCG/ACT nasal spray Place 2 sprays into both nostrils daily. 06/20/22  Yes Nyoka Lint, PA-C  predniSONE (DELTASONE) 10 MG tablet Take 1  tablet (10 mg total) by mouth in the morning, at noon, and at bedtime. 06/20/22  Yes Nyoka Lint, PA-C  acetaminophen (TYLENOL) 500 MG tablet Take 1,000 mg by mouth every 6 (six) hours as needed for moderate pain.    [provider]  amLODipine (NORVASC) 5 MG tablet Take 1 tablet (5 mg total) by mouth daily. 07/12/21   Burnell Blanks, MD  aspirin EC 81 MG tablet Take 1 tablet (81 mg total) by mouth daily. 11/29/18   Nani Skillern, PA-C  docusate sodium (COLACE)  100 MG capsule Take 1 capsule (100 mg total) by mouth every 12 (twelve) hours. Patient not taking: Reported on 07/12/2021 06/14/21   Sherwood Gambler, MD  ibuprofen (ADVIL) 800 MG tablet Take 1 tablet (800 mg total) by mouth every 8 (eight) hours as needed. 07/15/21   Kinsinger, Arta Bruce, MD  omega-3 acid ethyl esters (LOVAZA) 1 g capsule Take 1 g by mouth daily.    [provider]  oxyCODONE (OXY IR/ROXICODONE) 5 MG immediate release tablet Take 1 tablet (5 mg total) by mouth every 6 (six) hours as needed for severe pain. 07/15/21   Kinsinger, Arta Bruce, MD  pantoprazole (PROTONIX) 40 MG tablet Take 1 tablet by mouth once daily 10/22/21   Burnell Blanks, MD  rosuvastatin (CRESTOR) 40 MG tablet Take 1 tablet by mouth once daily 10/22/21   Burnell Blanks, MD    Family History Family History  Problem Relation Age of Onset   Hypertension Father    Prostate cancer Neg Hx    Bladder Cancer Neg Hx    Kidney cancer Neg Hx     Social History Social History   Tobacco Use   Smoking status: Every Day    Packs/day: 0.75    Types: Cigarettes   Smokeless tobacco: Never  Vaping Use   Vaping Use: Never used  Substance Use Topics   Alcohol use: Not Currently   Drug use: Never     Allergies   Patient has no known allergies.   Review of Systems Review of Systems  HENT:  Positive for ear pain.   Respiratory:  Positive for cough and shortness of breath.      Physical Exam Triage Vital Signs ED Triage Vitals [06/20/22 1538]  Enc Vitals Group     BP (!) 143/87     Pulse Rate 82     Resp 16     Temp 98.1 F (36.7 C)     Temp Source Oral     SpO2 98 %     Weight      Height      Head Circumference      Peak Flow      Pain Score      Pain Loc      Pain Edu?      Excl. in [place]?    No data found.  Updated Vital Signs BP (!) 143/87 (BP Location: Left Arm)   Pulse 82   Temp 98.1 F (36.7 C) (Oral)   Resp 16   SpO2 98%   Visual Acuity Right Eye  Distance:   Left Eye Distance:   Bilateral Distance:    Right Eye Near:   Left Eye Near:    Bilateral Near:     Physical Exam Constitutional:      Appearance: He is well-developed.  HENT:     Right Ear: Ear canal normal. Tympanic membrane is injected.     Left Ear: Ear  canal normal. Tympanic membrane is injected.     Mouth/Throat:     Mouth: Mucous membranes are moist.     Pharynx: Oropharynx is clear.  Cardiovascular:     Rate and Rhythm: Normal rate and regular rhythm.     Heart sounds: Normal heart sounds.  Pulmonary:     Effort: Pulmonary effort is normal.     Breath sounds: Normal breath sounds and air entry. No wheezing, rhonchi or rales.  Lymphadenopathy:     Cervical: No cervical adenopathy.  Neurological:     Mental Status: He is alert.      UC Treatments / Results  Labs (all labs ordered are listed, but only abnormal results are displayed) Labs Reviewed - No data to display  EKG   Radiology No results found.  Procedures Procedures (including critical care time)  Medications Ordered in UC Medications - No data to display  Initial Impression / Assessment and Plan / UC Course  I have reviewed the triage vital signs and the nursing notes.  Pertinent labs & imaging results that were available during my care of the patient were reviewed by me and considered in my medical decision making (see chart for details).    Plan: The diagnosis will be treated with the following: 1.  Upper respiratory tract infection: A.  Flonase nasal spray, 2 sprays each nostril once daily to help decrease sinus congestion. 2.  Maxillary sinusitis: A.  Prednisone 10 mg 3 times a day for 5 days only to help reduce inflammatory process. B.  Zithromax 250 mg, 2 tablets today and then 1 tablet daily to treat sinus infection. 3.  Advised take Tylenol or Aleve as needed for pain relief. 4.  Advised follow-up PCP or return to urgent care as needed. Final Clinical Impressions(s) / UC  Diagnoses   Final diagnoses:  Acute upper respiratory infection  Acute non-recurrent maxillary sinusitis     Discharge Instructions      Advised to use the Flonase nasal spray, 2 sprays each nostril once daily to help decrease sinus congestion and pressure. Advised take prednisone 10 mg 3 times a day for 5 days only to help reduce the acute inflammatory process from the sinuses. Advised take Zithromax 250 mg, 2 tablets today and then 1 tablet daily for 5 days to treat sinus infection. Advised take Tylenol, ibuprofen if needed for pain relief.  Advised follow-up PCP or return to urgent care as needed.    ED Prescriptions     Medication Sig Dispense Auth. Provider   fluticasone (FLONASE) 50 MCG/ACT nasal spray Place 2 sprays into both nostrils daily. 9.9 mL Nyoka Lint, PA-C   azithromycin (ZITHROMAX Z-PAK) 250 MG tablet 2 tablets initially and then 1 tablet daily until completed. 6 each Nyoka Lint, PA-C   predniSONE (DELTASONE) 10 MG tablet Take 1 tablet (10 mg total) by mouth in the morning, at noon, and at bedtime. 15 tablet Nyoka Lint, PA-C      PDMP not reviewed this encounter.   Nyoka Lint, PA-C 06/20/22 1554

## 2022-06-20 NOTE — Discharge Instructions (Signed)
Advised to use the Flonase nasal spray, 2 sprays each nostril once daily to help decrease sinus congestion and pressure. Advised take prednisone 10 mg 3 times a day for 5 days only to help reduce the acute inflammatory process from the sinuses. Advised take Zithromax 250 mg, 2 tablets today and then 1 tablet daily for 5 days to treat sinus infection. Advised take Tylenol, ibuprofen if needed for pain relief.  Advised follow-up PCP or return to urgent care as needed.

## 2022-06-20 NOTE — ED Triage Notes (Incomplete)
Ptis here for SOB,sore throat, cough,nasal congestion runny nose, bilateral ear pain

## 2022-07-16 ENCOUNTER — Other Ambulatory Visit: Payer: Self-pay | Admitting: Cardiovascular Disease

## 2022-11-10 ENCOUNTER — Other Ambulatory Visit: Payer: Self-pay | Admitting: Cardiovascular Disease

## 2022-12-04 NOTE — Progress Notes (Signed)
Cardiology Office Note:  .   Date:  12/05/2022  ID:  George Munoz, DOB 1971-02-18, MRN [id number] PCP: [facility]  [facility] Providers Cardiologist:  Verne Carrow, MD {  History of Present Illness: .   George Munoz is a 52 y.o. male with a past medical history of CAD status post CABG following cardiac arrest July 2020, PAF, HLD, tobacco abuse here for follow-up.  Was admitted to [facility] 11/21/2018 following cardiac arrest.  Found to have distal left main and triple-vessel CAD.  Underwent three-vessel CABG 11/25/2018 (LIMA to LAD, SVG to diagonal, SVG to OM) with left atrial appendage clip placement.  Echo July 2028 with LVEF 66 5%, no valvular disease.  Cardiac monitor August 2020 with sinus rhythm, no arrhythmias.  Did not tolerate beta-blocker due to fatigue.  Was last seen 07/12/2021 and was doing well at that time.  Denies chest pain, dyspnea, palpitations, lower extremity edema, orthopnea, PND, dizziness, near-syncope, or syncope.  Inguinal hernia repair was planned and he presented for preop risk assessment.  Today, he states over the past year he had hernia surgery and surgery on his mouth.  Dentist said there was nothing they could do so they ended up cutting out his gums and taking everything out.  He then got hurt at work and got a hernia which ultimately was repaired.  He had an upper respiratory infection in February.  Nothing new with his heart.  He does tell me that he bends down and occasionally gets lightheaded or dizzy.  Maybe it was his blood pressure.  Probably some orthostatic hypotension.  Discussed proper hydration.  He is trying to find a PCP in [place] and we provided him with a list today.  He has not drank alcohol in a really long time but over the last 2 months he has developed a taste for peanut butter whiskey and has a shot or 2 on Friday and Saturday nights in his coffee.  He drinks coffee all day long and enjoys making really  good coffee and grounding his and beans.  Encouraged him to keep up with water and hydration consumption especially since he is working outside.  Sometimes he will drink 3 propel's a day but does not like the taste of regular water.  We will check electrolytes today to ensure he is not drinking too many propels.   Reports no shortness of breath nor dyspnea on exertion. Reports no chest pain, pressure, or tightness. No edema, orthopnea, PND. Reports no palpitations.    ROS: Pertinent ROS in HPI  Studies Reviewed: Marland Kitchen   EKG Interpretation Date/Time:  Friday December 05 2022 11:10:58 EDT Ventricular Rate:  79 PR Interval:  128 QRS Duration:  94 QT Interval:  376 QTC Calculation: 431 R Axis:   77  Text Interpretation: Normal sinus rhythm Right atrial enlargement When compared with ECG of 09-Jul-2021 14:38, No significant change was found Confirmed by Jari Favre [phone]) on 12/05/2022 11:39:00 AM    Echo 11/22/18:  1. The left ventricle has normal systolic function with an ejection fraction of 60-65%. The cavity size was normal. Left ventricular diastolic parameters were normal.  2. The right ventricle has normal systolic function. The cavity was normal. There is no increase   Cardiac cath 11/21/18: Mid RCA lesion is 40% stenosed. Prox Cx lesion is 99% stenosed. 1st Mrg lesion is 99% stenosed. 2nd Diag lesion is 90% stenosed. Ost LM to Dist LM lesion is 50% stenosed. Prox RCA lesion is 50%  stenosed. There is mild left ventricular systolic dysfunction. LV end diastolic pressure is normal. The left ventricular ejection fraction is 45-50% by visual estimate. There is no mitral valve regurgitation. Mid LAD lesion is 95% stenosed.   1. Moderate distal left main stenosis 2. Severe mid LAD stenosis 3. Severe mid Circumflex stenosis involving the ostium of a very large obtuse marginal branch 4. Moderate mid RCA stenosis 5. Mild segmental LV systolic dysfunction (LVEF=45-50%).  6. Post cardiac  arrest (ventricular fibrillation) 7. Rapid atrial fibrillation Risk Assessment/Calculations:    CHA2DS2-VASc Score = 2   This indicates a 2.2% annual risk of stroke. The patient's score is based upon: CHF History: 0 HTN History: 1 Diabetes History: 0 Stroke History: 0 Vascular Disease History: 1 Age Score: 0 Gender Score: 0      Physical Exam:   VS:  BP 130/76   Pulse 79   Ht 5' 8.5" (1.74 m)   Wt 202 lb (91.6 kg)   SpO2 96%   BMI 30.27 kg/m    Wt Readings from Last 3 Encounters:  12/05/22 202 lb (91.6 kg)  07/15/21 187 lb (84.8 kg)  07/12/21 195 lb (88.5 kg)    GEN: Well nourished, well developed in no acute distress NECK: No JVD; No carotid bruits CARDIAC: RRR, no murmurs, rubs, gallops RESPIRATORY:  Clear to auscultation without rales, wheezing or rhonchi  ABDOMEN: Soft, non-tender, non-distended EXTREMITIES:  No edema; No deformity   ASSESSMENT AND PLAN: .   1.  CAD status post CABG -no chest pain or SOB -Continue ASA 81 mg, amlodipine 5 mg daily, Crestor 40 mg daily  2.  Atrial fibrillation, paroxysmal -Normal sinus rhythm today -Not had any issues with his rhythm to his -Remains on an aspirin a day but no formal anticoagulation  3.  HTN -Blood pressure well-controlled today -Continue current medication regimen- -continue low-sodium, heart healthy diet  4.  Tobacco abuse -does want to quit  -patches, gum, hypnosis -two or three pills in the past and none worked -[employer], social work to discuss  5.  Hyperlipidemia -LDL 66 back in April 2023 -Due for updated lab work so we will order today since he is fasted as well as CMP        Dispo: He can follow-up in a year with myself or Dr. Clifton James  Signed, Sharlene Dory, PA-C

## 2022-12-05 ENCOUNTER — Encounter: Payer: Self-pay | Admitting: Physician Assistant

## 2022-12-05 ENCOUNTER — Ambulatory Visit: Payer: Medicaid Other | Admitting: Physician Assistant

## 2022-12-05 VITALS — BP 130/76 | HR 79 | Ht 68.5 in | Wt 202.0 lb

## 2022-12-05 DIAGNOSIS — E785 Hyperlipidemia, unspecified: Secondary | ICD-10-CM

## 2022-12-05 DIAGNOSIS — I4891 Unspecified atrial fibrillation: Secondary | ICD-10-CM | POA: Diagnosis not present

## 2022-12-05 DIAGNOSIS — Z951 Presence of aortocoronary bypass graft: Secondary | ICD-10-CM

## 2022-12-05 DIAGNOSIS — Z720 Tobacco use: Secondary | ICD-10-CM | POA: Diagnosis not present

## 2022-12-05 DIAGNOSIS — I2510 Atherosclerotic heart disease of native coronary artery without angina pectoris: Secondary | ICD-10-CM | POA: Diagnosis not present

## 2022-12-05 DIAGNOSIS — I10 Essential (primary) hypertension: Secondary | ICD-10-CM | POA: Diagnosis not present

## 2022-12-05 LAB — LIPID PANEL
Chol/HDL Ratio: 2.9 ratio (ref 0.0–5.0)
Cholesterol, Total: 134 mg/dL (ref 100–199)
HDL: 46 mg/dL (ref >39)
LDL Chol Calc (NIH): 67 mg/dL (ref 0–99)
Triglycerides: 114 mg/dL (ref 0–149)
VLDL Cholesterol Cal: 21 mg/dL (ref 5–40)

## 2022-12-05 LAB — COMPREHENSIVE METABOLIC PANEL
ALT: 16 IU/L (ref 0–44)
AST: 16 IU/L (ref 0–40)
Albumin: 4.4 g/dL (ref 3.8–4.9)
Alkaline Phosphatase: 97 IU/L (ref 44–121)
BUN/Creatinine Ratio: 13 (ref 9–20)
BUN: 14 mg/dL (ref 6–24)
Bilirubin Total: 0.3 mg/dL (ref 0.0–1.2)
CO2: 23 mmol/L (ref 20–29)
Calcium: 9.2 mg/dL (ref 8.7–10.2)
Chloride: 103 mmol/L (ref 96–106)
Creatinine, Ser: 1.07 mg/dL (ref 0.76–1.27)
Globulin, Total: 2.3 g/dL (ref 1.5–4.5)
Glucose: 91 mg/dL (ref 70–99)
Potassium: 4.7 mmol/L (ref 3.5–5.2)
Sodium: 139 mmol/L (ref 134–144)
Total Protein: 6.7 g/dL (ref 6.0–8.5)
eGFR: 83 mL/min/{1.73_m2} (ref >59)

## 2022-12-05 LAB — CBC

## 2022-12-05 MED ORDER — ROSUVASTATIN CALCIUM 40 MG PO TABS: 40.0000 mg | ORAL_TABLET | Freq: Every day | ORAL | 3 refills | Status: AC

## 2022-12-05 MED ORDER — PANTOPRAZOLE SODIUM 40 MG PO TBEC: 40.0000 mg | DELAYED_RELEASE_TABLET | Freq: Every day | ORAL | 3 refills | Status: DC

## 2022-12-05 MED ORDER — AMLODIPINE BESYLATE 5 MG PO TABS
5.0000 mg | ORAL_TABLET | Freq: Every day | ORAL | 3 refills | Status: AC
Start: 2022-12-05 — End: ?

## 2022-12-05 NOTE — Patient Instructions (Signed)
Medication Instructions:   Your physician recommends that you continue on your current medications as directed. Please refer to the Current Medication list given to you today.   *If you need a refill on your cardiac medications before your next appointment, please call your pharmacy*   Lab Work:  CMET LIPIDS AND CBC TODAY    If you have labs (blood work) drawn today and your tests are completely normal, you will receive your results only by: MyChart Message (if you have MyChart) OR A paper copy in the mail If you have any lab test that is abnormal or we need to change your treatment, we will call you to review the results.   Testing/Procedures: NONE ORDERED  TODAY   Follow-Up: At [facility], you and your health needs are our priority.  As part of our continuing mission to provide you with exceptional heart care, we have created designated Provider Care Teams.  These Care Teams include your primary Cardiologist (physician) and Advanced Practice Providers (APPs -  Physician Assistants and Nurse Practitioners) who all work together to provide you with the care you need, when you need it.  We recommend signing up for the patient portal called "MyChart".  Sign up information is provided on this After Visit Summary.  MyChart is used to connect with patients for Virtual Visits (Telemedicine).  Patients are able to view lab/test results, encounter notes, upcoming appointments, etc.  Non-urgent messages can be sent to your provider as well.   To learn more about what you can do with MyChart, go to [email or web address].    Your next appointment:   1 year(s)  Provider:   Verne Carrow, MD  or Jari Favre, PA-C       Other Instructions

## 2022-12-08 ENCOUNTER — Telehealth: Payer: Self-pay

## 2022-12-08 DIAGNOSIS — Z0000 Encounter for general adult medical examination without abnormal findings: Secondary | ICD-10-CM

## 2022-12-08 NOTE — Telephone Encounter (Signed)
Called patient to discuss health coaching referral for smoking cessation from [facility], [place]. Patient did not answer. Was not able to leave a message for patient due to mailbox being full.   Renaee Munda, MS, ERHD, [facility]  Care Guide, Health & Wellness Coach [address] [place] [address] Telephone: [phone] Email: [email or web address]

## 2022-12-16 ENCOUNTER — Telehealth: Payer: Self-pay

## 2022-12-16 DIAGNOSIS — Z0000 Encounter for general adult medical examination without abnormal findings: Secondary | ICD-10-CM

## 2022-12-16 NOTE — Telephone Encounter (Signed)
Called patient per health coaching referral for smoking cessation. Patient did not answer. Was not able to leave a message due to voicemail being full.   Renaee Munda, MS, ERHD, [facility]  Care Guide, Health & Wellness Coach [address] [place] [address] Telephone: [phone] Email: [email or web address]

## 2022-12-17 ENCOUNTER — Ambulatory Visit
Admission: EM | Admit: 2022-12-17 | Discharge: 2022-12-17 | Disposition: A | Discharge status: Home / Self Care | Payer: Medicaid Other | Attending: Physician Assistant | Admitting: Physician Assistant

## 2022-12-17 DIAGNOSIS — L309 Dermatitis, unspecified: Secondary | ICD-10-CM | POA: Diagnosis not present

## 2022-12-17 MED ORDER — PREDNISONE 20 MG PO TABS: 40.0000 mg | ORAL_TABLET | Freq: Every day | ORAL | 0 refills | Status: AC

## 2022-12-17 NOTE — ED Triage Notes (Signed)
Patient presents to [facility] for generalized rash x 1 week. Treating with benadryl spray and benadryl helps a little. States overnight it gets better but the next morning he develops new welts. States no changes in products, meds, or diet.

## 2022-12-18 NOTE — ED Provider Notes (Signed)
[facility]    CSN: [id number] Arrival date & time: 12/17/22  1548      History   Chief Complaint Chief Complaint  Patient presents with   Rash    HPI George Munoz is a 52 y.o. male.   Patient here today for evaluation of generalized itchy rash he has had for the last week.  He states he has been using Benadryl with mild relief.  He states he gets better overnight but in the morning gets worse.  He denies any changes in products, medications or diet.  He has not had any shortness of breath or trouble breathing.  The history is provided by the patient.  Rash Associated symptoms: no fever and no shortness of breath     Past Medical History:  Diagnosis Date   Anxiety 11/23/2018   CAD (coronary artery disease)    S/P cardiac arrest 11/21/18, Cath-MVD, 11/25/18-3V CABG and LA clipping   Hyperlipidemia LDL goal <70    Hypertension    Myocardial infarction [facility])    Restless leg    Tobacco abuse     Patient Active Problem List   Diagnosis Date Noted   Near syncope    Vasovagal near syncope    S/P CABG x 3 11/25/2018   NSTEMI (non-ST elevated myocardial infarction) (HCC) 11/21/2018   Atrial fibrillation with RVR (HCC)    Acute respiratory failure (HCC)    Endotracheally intubated    Cardiac arrest [facility])     Past Surgical History:  Procedure Laterality Date   CLIPPING OF ATRIAL APPENDAGE N/A 11/25/2018   Procedure: CLIPPING OF ATRIAL APPENDAGE;  Surgeon: Corliss Skains, MD;  Location: [facility] OR;  Service: Open Heart Surgery;  Laterality: N/A;  FLOW TRACK   CORONARY ARTERY BYPASS GRAFT N/A 11/25/2018   Procedure: CORONARY ARTERY BYPASS GRAFTING (CABG) times three, using left mammary artery and right greater saphenous vein harvested endoscopically;  Surgeon: Corliss Skains, MD;  Location: [facility] OR;  Service: Open Heart Surgery;  Laterality: N/A;   INGUINAL HERNIA REPAIR Left 07/15/2021   Procedure: OPEN LEFT INGUINAL HERNIA REPAIR WITH MESH;  Surgeon:  Kinsinger, De Blanch, MD;  Location: [facility] OR;  Service: General;  Laterality: Left;   LEFT HEART CATH AND CORONARY ANGIOGRAPHY N/A 11/21/2018   Procedure: LEFT HEART CATH AND CORONARY ANGIOGRAPHY;  Surgeon: Kathleene Hazel, MD;  Location: [facility] INVASIVE CV LAB;  Service: Cardiovascular;  Laterality: N/A;   TEE WITHOUT CARDIOVERSION N/A 11/25/2018   Procedure: TRANSESOPHAGEAL ECHOCARDIOGRAM (TEE);  Surgeon: Corliss Skains, MD;  Location: [facility] OR;  Service: Open Heart Surgery;  Laterality: N/A;       Home Medications    Prior to Admission medications   Medication Sig Start Date End Date Taking? Authorizing Provider  predniSONE (DELTASONE) 20 MG tablet Take 2 tablets (40 mg total) by mouth daily with breakfast for 5 days. 12/17/22 12/22/22 Yes Tomi Bamberger, PA-C  acetaminophen (TYLENOL) 500 MG tablet Take 1,000 mg by mouth every 6 (six) hours as needed for moderate pain.    [provider]  amLODipine (NORVASC) 5 MG tablet Take 1 tablet (5 mg total) by mouth daily. 12/05/22   Sharlene Dory, PA-C  aspirin EC 81 MG tablet Take 1 tablet (81 mg total) by mouth daily. 11/29/18   Ardelle Balls, PA-C  fluticasone (FLONASE) 50 MCG/ACT nasal spray Place 2 sprays into both nostrils daily as needed for allergies or rhinitis.    [provider]  Naproxen Sodium  220 MG CAPS Take 220 mg by mouth as needed.    [provider]  omega-3 acid ethyl esters (LOVAZA) 1 g capsule Take 1 g by mouth daily.    [provider]  pantoprazole (PROTONIX) 40 MG tablet Take 1 tablet (40 mg total) by mouth daily. 12/05/22   Sharlene Dory, PA-C  rosuvastatin (CRESTOR) 40 MG tablet Take 1 tablet (40 mg total) by mouth daily. 12/05/22   Sharlene Dory, PA-C    Family History Family History  Problem Relation Age of Onset   Hypertension Father    Prostate cancer Neg Hx    Bladder Cancer Neg Hx    Kidney cancer Neg Hx     Social History Social History   Tobacco Use    Smoking status: Every Day    Current packs/day: 0.75    Types: Cigarettes   Smokeless tobacco: Never  Vaping Use   Vaping status: Never Used  Substance Use Topics   Alcohol use: Not Currently   Drug use: Never     Allergies   Patient has no known allergies.   Review of Systems Review of Systems  Constitutional:  Negative for chills and fever.  HENT:  Negative for trouble swallowing.   Eyes:  Negative for discharge and redness.  Respiratory:  Negative for shortness of breath.   Skin:  Positive for rash. Negative for color change.  Neurological:  Negative for numbness.     Physical Exam Triage Vital Signs ED Triage Vitals  Encounter Vitals Group     BP 12/17/22 1625 (!) 146/83     Systolic BP Percentile --      Diastolic BP Percentile --      Pulse Rate 12/17/22 1625 79     Resp 12/17/22 1625 18     Temp 12/17/22 1625 98 F (36.7 C)     Temp Source 12/17/22 1625 Oral     SpO2 12/17/22 1625 96 %     Weight --      Height --      Head Circumference --      Peak Flow --      Pain Score 12/17/22 1624 0     Pain Loc --      Pain Education --      Exclude from Growth Chart --    No data found.  Updated Vital Signs BP (!) 146/83 (BP Location: Left Arm)   Pulse 79   Temp 98 F (36.7 C) (Oral)   Resp 18   SpO2 96%     Physical Exam Vitals and nursing note reviewed.  Constitutional:      General: He is not in acute distress.    Appearance: Normal appearance. He is not ill-appearing.  HENT:     Head: Normocephalic and atraumatic.  Eyes:     Conjunctiva/sclera: Conjunctivae normal.  Cardiovascular:     Rate and Rhythm: Normal rate.  Pulmonary:     Effort: Pulmonary effort is normal. No respiratory distress.  Skin:    Comments: Scattered erythematous wheal like lesions to bilateral arms, neck, back  Neurological:     Mental Status: He is alert.  Psychiatric:        Mood and Affect: Mood normal.        Behavior: Behavior normal.        Thought Content:  Thought content normal.      UC Treatments / Results  Labs (all labs ordered are listed, but only abnormal results  are displayed) Labs Reviewed - No data to display  EKG   Radiology No results found.  Procedures Procedures (including critical care time)  Medications Ordered in UC Medications - No data to display  Initial Impression / Assessment and Plan / UC Course  I have reviewed the triage vital signs and the nursing notes.  Pertinent labs & imaging results that were available during my care of the patient were reviewed by me and considered in my medical decision making (see chart for details).    Unclear etiology of rash.  Will treat with steroids as it does have a wheal-like appearance.  Encouraged follow-up if no gradual improvement or with any further concerns.  Patient expresses understanding.  Final Clinical Impressions(s) / UC Diagnoses   Final diagnoses:  Dermatitis   Discharge Instructions   None    ED Prescriptions     Medication Sig Dispense Auth. Provider   predniSONE (DELTASONE) 20 MG tablet Take 2 tablets (40 mg total) by mouth daily with breakfast for 5 days. 10 tablet Tomi Bamberger, PA-C      PDMP not reviewed this encounter.   Tomi Bamberger, PA-C 12/18/22 [phone]

## 2022-12-23 ENCOUNTER — Telehealth: Payer: Self-pay

## 2022-12-23 DIAGNOSIS — Z0000 Encounter for general adult medical examination without abnormal findings: Secondary | ICD-10-CM

## 2022-12-23 NOTE — Telephone Encounter (Signed)
Called patient to follow up on health coaching referral for smoking cessation from [facility], [place]. Patient did not answer. Was unable to leave message due to voicemail being full.    Renaee Munda, MS, ERHD, [facility]  Care Guide, Health & Wellness Coach [address] [place] [address] Telephone: [phone] Email: [email or web address]

## 2023-02-09 ENCOUNTER — Other Ambulatory Visit: Payer: Self-pay

## 2023-02-09 ENCOUNTER — Ambulatory Visit
Admission: EM | Admit: 2023-02-09 | Discharge: 2023-02-09 | Disposition: A | Discharge status: Home / Self Care | Payer: Medicaid Other | Attending: Internal Medicine | Admitting: Internal Medicine

## 2023-02-09 ENCOUNTER — Encounter: Payer: Self-pay | Admitting: *Deleted

## 2023-02-09 DIAGNOSIS — J069 Acute upper respiratory infection, unspecified: Secondary | ICD-10-CM | POA: Diagnosis not present

## 2023-02-09 DIAGNOSIS — Z1152 Encounter for screening for COVID-19: Secondary | ICD-10-CM | POA: Diagnosis not present

## 2023-02-09 DIAGNOSIS — B9789 Other viral agents as the cause of diseases classified elsewhere: Secondary | ICD-10-CM | POA: Insufficient documentation

## 2023-02-09 DIAGNOSIS — F17210 Nicotine dependence, cigarettes, uncomplicated: Secondary | ICD-10-CM | POA: Insufficient documentation

## 2023-02-09 LAB — POCT INFLUENZA A/B
Influenza A, POC: NEGATIVE
Influenza B, POC: NEGATIVE

## 2023-02-09 MED ORDER — FLUTICASONE PROPIONATE 50 MCG/ACT NA SUSP: 1.0000 | Freq: Every day | NASAL | 0 refills | Status: DC

## 2023-02-09 MED ORDER — BENZONATATE 100 MG PO CAPS: 100.0000 mg | ORAL_CAPSULE | Freq: Three times a day (TID) | ORAL | 0 refills | Status: DC | PRN

## 2023-02-09 NOTE — ED Triage Notes (Signed)
C/O facial pain and nasal congestion with yellow nasal discharge, poor appetite, generalized weakness, sore throat onset yesterday. This AM started with cough. Taking Sudafed and Nyquil. Unsure if fevers.

## 2023-02-09 NOTE — ED Provider Notes (Signed)
George Munoz    CSN: [id number] Arrival date & time: 02/09/23  0808      History   Chief Complaint Chief Complaint  Patient presents with   Nasal Congestion   Sore Throat    HPI George Munoz is a 52 y.o. male.   Patient presents with nasal congestion, decreased appetite, generalized bodyaches, sore throat, cough that started yesterday.  Patient has taken Sudafed and NyQuil for symptoms.  Reports his son has had similar symptoms.  Patient denies any formal diagnosis of asthma or COPD but reports that he does smoke approximately pack a day of cigarettes.  Denies chest pain or shortness of breath.   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   NSTEMI (non-ST elevated myocardial infarction) (HCC) 11/21/2018   Atrial fibrillation with RVR (HCC)    Acute respiratory failure (HCC)    Endotracheally intubated    Cardiac arrest [facility])     Past Surgical History:  Procedure Laterality Date   CLIPPING OF ATRIAL APPENDAGE N/A 11/25/2018   Procedure: CLIPPING OF ATRIAL APPENDAGE;  Surgeon: Corliss Skains, MD;  Location: [facility] OR;  Service: Open Heart Surgery;  Laterality: N/A;  FLOW TRACK   CORONARY ARTERY BYPASS GRAFT N/A 11/25/2018   Procedure: CORONARY ARTERY BYPASS GRAFTING (CABG) times three, using left mammary artery and right greater saphenous vein harvested endoscopically;  Surgeon: Corliss Skains, MD;  Location: [facility] OR;  Service: Open Heart Surgery;  Laterality: N/A;   INGUINAL HERNIA REPAIR Left 07/15/2021   Procedure: OPEN LEFT INGUINAL HERNIA REPAIR WITH MESH;  Surgeon:  Kinsinger, De Blanch, MD;  Location: [facility] OR;  Service: General;  Laterality: Left;   LEFT HEART CATH AND CORONARY ANGIOGRAPHY N/A 11/21/2018   Procedure: LEFT HEART CATH AND CORONARY ANGIOGRAPHY;  Surgeon: Kathleene Hazel, MD;  Location: [facility] INVASIVE CV LAB;  Service: Cardiovascular;  Laterality: N/A;   TEE WITHOUT CARDIOVERSION N/A 11/25/2018   Procedure: TRANSESOPHAGEAL ECHOCARDIOGRAM (TEE);  Surgeon: Corliss Skains, MD;  Location: [facility] OR;  Service: Open Heart Surgery;  Laterality: N/A;       Home Medications    Prior to Admission medications   Medication Sig Start Date End Date Taking? Authorizing Provider  amLODipine (NORVASC) 5 MG tablet Take 1 tablet (5 mg total) by mouth daily. 12/05/22  Yes Sharlene Dory, PA-C  aspirin EC 81 MG tablet Take 1 tablet (81 mg total) by mouth daily. 11/29/18  Yes Doree Fudge M, PA-C  benzonatate (TESSALON) 100 MG capsule Take 1 capsule (100 mg total) by mouth every 8 (eight) hours as needed for cough. 02/09/23  Yes Maximillion Gill, Rolly Salter E, FNP  fluticasone (FLONASE) 50 MCG/ACT nasal spray Place 1 spray into both nostrils daily. 02/09/23  Yes Vallen Calabrese, Rolly Salter E, FNP  Naproxen Sodium 220 MG CAPS Take 220 mg by mouth as needed.   Yes [provider]  omega-3 acid ethyl esters (LOVAZA) 1 g capsule Take 1 g by mouth daily.   Yes [provider]  pantoprazole (PROTONIX)  40 MG tablet Take 1 tablet (40 mg total) by mouth daily. 12/05/22  Yes Conte, Tessa N, PA-C  rosuvastatin (CRESTOR) 40 MG tablet Take 1 tablet (40 mg total) by mouth daily. 12/05/22  Yes Conte, Tessa N, PA-C  acetaminophen (TYLENOL) 500 MG tablet Take 1,000 mg by mouth every 6 (six) hours as needed for moderate pain.    [provider]    Family History Family History  Problem Relation Age of Onset   Hypertension Father    Prostate cancer Neg Hx    Bladder Cancer Neg Hx    Kidney cancer Neg Hx     Social History Social History   Tobacco Use   Smoking status:  Every Day    Current packs/day: 0.75    Types: Cigarettes   Smokeless tobacco: Never  Vaping Use   Vaping status: Never Used  Substance Use Topics   Alcohol use: Not Currently   Drug use: Never     Allergies   Patient has no known allergies.   Review of Systems Review of Systems Per HPI  Physical Exam Triage Vital Signs ED Triage Vitals  Encounter Vitals Group     BP 02/09/23 0827 133/86     Systolic BP Percentile --      Diastolic BP Percentile --      Pulse Rate 02/09/23 0827 100     Resp 02/09/23 0827 20     Temp 02/09/23 0827 97.8 F (36.6 C)     Temp Source 02/09/23 0827 Oral     SpO2 02/09/23 0827 97 %     Weight --      Height --      Head Circumference --      Peak Flow --      Pain Score 02/09/23 0830 6     Pain Loc --      Pain Education --      Exclude from Growth Chart --    No data found.  Updated Vital Signs BP 133/86 (BP Location: Right Arm)   Pulse 100   Temp 97.8 F (36.6 C) (Oral)   Resp 20   SpO2 97%   Visual Acuity Right Eye Distance:   Left Eye Distance:   Bilateral Distance:    Right Eye Near:   Left Eye Near:    Bilateral Near:     Physical Exam Constitutional:      General: He is not in acute distress.    Appearance: Normal appearance. He is not toxic-appearing or diaphoretic.  HENT:     Head: Normocephalic and atraumatic.     Right Ear: Tympanic membrane and ear canal normal.     Left Ear: Tympanic membrane and ear canal normal.     Nose: Congestion present.     Mouth/Throat:     Mouth: Mucous membranes are moist.     Pharynx: Posterior oropharyngeal erythema present.  Eyes:     Extraocular Movements: Extraocular movements intact.     Conjunctiva/sclera: Conjunctivae normal.     Pupils: Pupils are equal, round, and reactive to light.  Cardiovascular:     Rate and Rhythm: Normal rate and regular rhythm.     Pulses: Normal pulses.     Heart sounds: Normal heart sounds.  Pulmonary:     Effort: Pulmonary effort is  normal. No respiratory distress.     Breath sounds: Normal breath sounds. No stridor. No wheezing, rhonchi or rales.  Abdominal:     General: Abdomen is flat.  Bowel sounds are normal.     Palpations: Abdomen is soft.  Musculoskeletal:        General: Normal range of motion.     Cervical back: Normal range of motion.  Skin:    General: Skin is warm and dry.  Neurological:     General: No focal deficit present.     Mental Status: He is alert and oriented to person, place, and time. Mental status is at baseline.  Psychiatric:        Mood and Affect: Mood normal.        Behavior: Behavior normal.      UC Treatments / Results  Labs (all labs ordered are listed, but only abnormal results are displayed) Labs Reviewed  SARS CORONAVIRUS 2 (TAT 6-24 HRS)  POCT INFLUENZA A/B    EKG   Radiology No results found.  Procedures Procedures (including critical Munoz time)  Medications Ordered in UC Medications - No data to display  Initial Impression / Assessment and Plan / UC Course  I have reviewed the triage vital signs and the nursing notes.  Pertinent labs & imaging results that were available during my Munoz of the patient were reviewed by me and considered in my medical decision making (see chart for details).     Patient presents with symptoms likely from a viral upper respiratory infection. Do not suspect underlying cardiopulmonary process. Symptoms seem unlikely related to ACS, CHF or COPD exacerbations, pneumonia, pneumothorax. Patient is nontoxic appearing and not in need of emergent medical intervention. Rapid flu was negative. Covid test pending. Do not think strep testing is necessary given appearance of throat on exam.   Recommended symptom control with medications and supportive Munoz.  Patient was sent Flonase and benzonatate.  Denies that he uses any daily nasal sprays.  Return if symptoms fail to improve in 1-2 weeks or you develop shortness of breath, chest pain,  severe headache. Patient states understanding and is agreeable.  Discharged with PCP followup.  Final Clinical Impressions(s) / UC Diagnoses   Final diagnoses:  Viral upper respiratory tract infection with cough     Discharge Instructions      Your rapid flu is negative.  COVID test is pending.  Will call if it is positive.  As we discussed, it appears that you have a viral illness that should simply run its course.  Ensure you are drinking plenty of water and getting plenty of rest.  I have sent you 2 medications that should help with your symptoms.  Follow-up if any symptoms persist or worsen.     ED Prescriptions     Medication Sig Dispense Auth. Provider   fluticasone (FLONASE) 50 MCG/ACT nasal spray Place 1 spray into both nostrils daily. 16 g Luellen Howson, Rolly Salter E, [place]   benzonatate (TESSALON) 100 MG capsule Take 1 capsule (100 mg total) by mouth every 8 (eight) hours as needed for cough. 21 capsule [place], Acie Fredrickson, [place]      PDMP not reviewed this encounter.   Gustavus Bryant, [place] 02/09/23 [phone]

## 2023-02-09 NOTE — Discharge Instructions (Signed)
Your rapid flu is negative.  COVID test is pending.  Will call if it is positive.  As we discussed, it appears that you have a viral illness that should simply run its course.  Ensure you are drinking plenty of water and getting plenty of rest.  I have sent you 2 medications that should help with your symptoms.  Follow-up if any symptoms persist or worsen.

## 2023-02-10 ENCOUNTER — Encounter (HOSPITAL_BASED_OUTPATIENT_CLINIC_OR_DEPARTMENT_OTHER): Payer: Self-pay | Admitting: Urology

## 2023-02-10 ENCOUNTER — Emergency Department (HOSPITAL_BASED_OUTPATIENT_CLINIC_OR_DEPARTMENT_OTHER): Payer: Medicaid Other

## 2023-02-10 ENCOUNTER — Other Ambulatory Visit (HOSPITAL_BASED_OUTPATIENT_CLINIC_OR_DEPARTMENT_OTHER): Payer: Self-pay

## 2023-02-10 ENCOUNTER — Other Ambulatory Visit: Payer: Self-pay

## 2023-02-10 ENCOUNTER — Emergency Department (HOSPITAL_BASED_OUTPATIENT_CLINIC_OR_DEPARTMENT_OTHER)
Admission: EM | Admit: 2023-02-10 | Discharge: 2023-02-10 | Disposition: A | Discharge status: Home / Self Care | Payer: Medicaid Other | Attending: Emergency Medicine | Admitting: Emergency Medicine

## 2023-02-10 DIAGNOSIS — J019 Acute sinusitis, unspecified: Secondary | ICD-10-CM | POA: Diagnosis not present

## 2023-02-10 DIAGNOSIS — Z1152 Encounter for screening for COVID-19: Secondary | ICD-10-CM | POA: Insufficient documentation

## 2023-02-10 DIAGNOSIS — J329 Chronic sinusitis, unspecified: Secondary | ICD-10-CM | POA: Diagnosis not present

## 2023-02-10 DIAGNOSIS — I2510 Atherosclerotic heart disease of native coronary artery without angina pectoris: Secondary | ICD-10-CM | POA: Insufficient documentation

## 2023-02-10 DIAGNOSIS — Z7982 Long term (current) use of aspirin: Secondary | ICD-10-CM | POA: Diagnosis not present

## 2023-02-10 DIAGNOSIS — R058 Other specified cough: Secondary | ICD-10-CM | POA: Diagnosis not present

## 2023-02-10 DIAGNOSIS — J029 Acute pharyngitis, unspecified: Secondary | ICD-10-CM | POA: Diagnosis present

## 2023-02-10 LAB — RESP PANEL BY RT-PCR (RSV, FLU A&B, COVID)  RVPGX2
Influenza A by PCR: NEGATIVE
Influenza B by PCR: NEGATIVE
Resp Syncytial Virus by PCR: NEGATIVE
SARS Coronavirus 2 by RT PCR: NEGATIVE

## 2023-02-10 LAB — SARS CORONAVIRUS 2 (TAT 6-24 HRS): SARS Coronavirus 2: NEGATIVE

## 2023-02-10 MED ORDER — AMOXICILLIN-POT CLAVULANATE 875-125 MG PO TABS
1.0000 | ORAL_TABLET | Freq: Two times a day (BID) | ORAL | 0 refills | Status: AC
  Filled 2023-02-10: qty 20, 10d supply, fill #0

## 2023-02-10 MED ORDER — PREDNISONE 20 MG PO TABS
40.0000 mg | ORAL_TABLET | Freq: Every day | ORAL | 0 refills | Status: AC
  Filled 2023-02-10: qty 10, 5d supply, fill #0

## 2023-02-10 NOTE — Discharge Instructions (Addendum)
Take antibiotics and steroids as prescribed.  Follow-up with your primary care doctor.

## 2023-02-10 NOTE — ED Triage Notes (Signed)
Pt states Sunday started having nasal congestion, sore throat, runny nose, worsening yesterday  Seen at UC yesterday Flu neg, Covid neg  Worsening since yesterday, now nauseated and productive cough  States sinus pressure and headache as well

## 2023-02-10 NOTE — ED Provider Notes (Signed)
[place] [facility] Provider Note   CSN: [id number] Arrival date & time: 02/10/23  1214     History  Chief Complaint  Patient presents with   URI    George Munoz is a 52 y.o. male.  Patient here with cough congestion nasal congestion, sore throat.  Nothing makes it worse or better.  Symptoms for the last few days.  Smoking history.  History of CAD.  Had negative COVID and flu test yesterday.  He was given nasal spray.  Nothing makes it worse or better.  Denies any fever chills.  He is have a lot of sinus congestion sinus pressure.  The history is provided by the patient.       Home Medications Prior to Admission medications   Medication Sig Start Date End Date Taking? Authorizing Provider  amoxicillin-clavulanate (AUGMENTIN) 875-125 MG tablet Take 1 tablet by mouth every 12 (twelve) hours for 10 days. 02/10/23 02/20/23 Yes Jerimey Burridge, DO  predniSONE (DELTASONE) 20 MG tablet Take 2 tablets (40 mg total) by mouth daily for 5 days. 02/10/23 02/15/23 Yes Laylaa Guevarra, DO  acetaminophen (TYLENOL) 500 MG tablet Take 1,000 mg by mouth every 6 (six) hours as needed for moderate pain.    [provider]  amLODipine (NORVASC) 5 MG tablet Take 1 tablet (5 mg total) by mouth daily. 12/05/22   Sharlene Dory, PA-C  aspirin EC 81 MG tablet Take 1 tablet (81 mg total) by mouth daily. 11/29/18   Ardelle Balls, PA-C  benzonatate (TESSALON) 100 MG capsule Take 1 capsule (100 mg total) by mouth every 8 (eight) hours as needed for cough. 02/09/23   Gustavus Bryant, FNP  fluticasone (FLONASE) 50 MCG/ACT nasal spray Place 1 spray into both nostrils daily. 02/09/23   Gustavus Bryant, FNP  Naproxen Sodium 220 MG CAPS Take 220 mg by mouth as needed.    [provider]  omega-3 acid ethyl esters (LOVAZA) 1 g capsule Take 1 g by mouth daily.    [provider]  pantoprazole (PROTONIX) 40 MG tablet Take 1 tablet (40 mg total) by  mouth daily. 12/05/22   Sharlene Dory, PA-C  rosuvastatin (CRESTOR) 40 MG tablet Take 1 tablet (40 mg total) by mouth daily. 12/05/22   Sharlene Dory, PA-C      Allergies    Patient has no known allergies.    Review of Systems   Review of Systems  Physical Exam Updated Vital Signs BP (!) 144/99 (BP Location: Left Arm)   Pulse (!) 113   Temp 98.3 F (36.8 C)   Resp 18   Ht 5\' 9"  (1.753 m)   Wt 91.6 kg   SpO2 98%   BMI 29.82 kg/m  Physical Exam Vitals and nursing note reviewed.  Constitutional:      General: He is not in acute distress.    Appearance: He is well-developed. He is not ill-appearing.  HENT:     Head: Normocephalic and atraumatic.     Nose: Congestion present.     Mouth/Throat:     Pharynx: No oropharyngeal exudate or posterior oropharyngeal erythema.  Eyes:     Extraocular Movements: Extraocular movements intact.     Conjunctiva/sclera: Conjunctivae normal.     Pupils: Pupils are equal, round, and reactive to light.  Cardiovascular:     Rate and Rhythm: Normal rate and regular rhythm.     Pulses: Normal pulses.     Heart sounds: Normal heart  sounds. No murmur heard. Pulmonary:     Effort: Pulmonary effort is normal. No respiratory distress.     Breath sounds: Normal breath sounds.  Abdominal:     Palpations: Abdomen is soft.     Tenderness: There is no abdominal tenderness.  Musculoskeletal:        General: No swelling.     Cervical back: Normal range of motion and neck supple.  Skin:    General: Skin is warm and dry.     Capillary Refill: Capillary refill takes less than 2 seconds.  Neurological:     General: No focal deficit present.     Mental Status: He is alert and oriented to person, place, and time.     Cranial Nerves: No cranial nerve deficit.     Sensory: No sensory deficit.     Motor: No weakness.     Coordination: Coordination normal.  Psychiatric:        Mood and Affect: Mood normal.     ED Results / Procedures / Treatments    Labs (all labs ordered are listed, but only abnormal results are displayed) Labs Reviewed  RESP PANEL BY RT-PCR (RSV, FLU A&B, COVID)  [id number]    EKG None  Radiology No results found.  Procedures Procedures    Medications Ordered in ED Medications - No data to display  ED Course/ Medical Decision Making/ A&P                                 Medical Decision Making Amount and/or Complexity of Data Reviewed Radiology: ordered.  Risk Prescription drug management.   George Munoz is here with congestion.  Does have a lot of nasal congestion and sinus pressure for the last few days.  Negative flu and COVID test yesterday.  Same today.  He appears to have fairly bad nasal congestion today.  Chest x-ray does not show any obvious pneumonia or pneumothorax.  He does not have any obvious evidence of strep throat on exam.  However we will treat with antibiotics with Augmentin and prednisone for bronchitis and congestion.  He is a smoker as well.  He is not having much wheezing.  Overall not having any chest pain or shortness of breath and he appears well.  Discharged in good condition.  Understand return precautions.  This chart was dictated using voice recognition software.  Despite best efforts to proofread,  errors can occur which can change the documentation meaning.         Final Clinical Impression(s) / ED Diagnoses Final diagnoses:  Sinusitis, unspecified chronicity, unspecified location    Rx / DC Orders ED Discharge Orders          Ordered    amoxicillin-clavulanate (AUGMENTIN) 875-125 MG tablet  Every 12 hours        02/10/23 1334    predniSONE (DELTASONE) 20 MG tablet  Daily        02/10/23 1334              Virgina Norfolk, DO 02/10/23 1358

## 2023-02-12 ENCOUNTER — Encounter: Payer: Self-pay | Admitting: Family Medicine

## 2023-02-12 ENCOUNTER — Ambulatory Visit: Payer: Medicaid Other | Admitting: Family Medicine

## 2023-02-12 VITALS — BP 121/68 | HR 89 | Ht 68.0 in | Wt 206.0 lb

## 2023-02-12 DIAGNOSIS — Z0000 Encounter for general adult medical examination without abnormal findings: Secondary | ICD-10-CM

## 2023-02-12 DIAGNOSIS — I2510 Atherosclerotic heart disease of native coronary artery without angina pectoris: Secondary | ICD-10-CM

## 2023-02-12 DIAGNOSIS — E782 Mixed hyperlipidemia: Secondary | ICD-10-CM

## 2023-02-12 DIAGNOSIS — G2581 Restless legs syndrome: Secondary | ICD-10-CM | POA: Diagnosis not present

## 2023-02-12 DIAGNOSIS — F17210 Nicotine dependence, cigarettes, uncomplicated: Secondary | ICD-10-CM | POA: Diagnosis not present

## 2023-02-12 DIAGNOSIS — I10 Essential (primary) hypertension: Secondary | ICD-10-CM

## 2023-02-12 DIAGNOSIS — Z1211 Encounter for screening for malignant neoplasm of colon: Secondary | ICD-10-CM | POA: Diagnosis not present

## 2023-02-12 DIAGNOSIS — Z951 Presence of aortocoronary bypass graft: Secondary | ICD-10-CM

## 2023-02-12 MED ORDER — NICOTINE 7 MG/24HR TD PT24: MEDICATED_PATCH | TRANSDERMAL | 0 refills | Status: DC

## 2023-02-12 MED ORDER — NICOTINE 21 MG/24HR TD PT24: MEDICATED_PATCH | TRANSDERMAL | 0 refills | Status: DC

## 2023-02-12 MED ORDER — NICOTINE 14 MG/24HR TD PT24: MEDICATED_PATCH | TRANSDERMAL | 0 refills | Status: DC

## 2023-02-12 NOTE — Assessment & Plan Note (Signed)
Longstanding history of smoking, approximately 1 pack per day for 40 years. Expressed interest in smoking cessation. -Initiate nicotine patch therapy for smoking cessation. -Consider additional pharmacotherapy if nicotine patches are not successful.

## 2023-02-12 NOTE — Assessment & Plan Note (Signed)
Blood pressure is at goal for age and co-morbidities.   Recommendations: amlodipine 5 mg daily,  - BP goal <130/80 - monitor and log blood pressures at home - check around the same time each day in a relaxed setting - Limit salt to <2000 mg/day - Follow DASH eating plan (heart healthy diet) - limit alcohol to 2 standard drinks per day for men and 1 per day for women - avoid tobacco products - get at least 2 hours of regular aerobic exercise weekly Patient aware of signs/symptoms requiring further/urgent evaluation. Labs deferred today.

## 2023-02-12 NOTE — Assessment & Plan Note (Signed)
History of restless leg syndrome, previously tried Gabapentin with poor tolerance due to side effects. -Consider alternative pharmacotherapy options at future visit if symptoms persist or worsen. Not interested in starting anything right now. Supportive measures discussed and handout provided.

## 2023-02-12 NOTE — Progress Notes (Signed)
New Patient Office Visit  Subjective    Patient ID: George Munoz, male    DOB: 01-31-1971  Age: 52 y.o. MRN: [id number]  CC:  Chief Complaint  Patient presents with   Establish Care    HPI [name] presents to establish care   Discussed the use of AI scribe software for clinical note transcription with the patient, who gave verbal consent to proceed.  History of Present Illness   The patient, with a history of cardiovascular disease, including a triple bypass following a myocardial infarction in 2020, hypertension, and hyperlipidemia, presents for primary care follow-up after recently changing practices [facility]). He also has a history of tobacco use, smoking approximately one pack per day for nearly 40 years. He has not previously participated in lung cancer screening programs but expressed interest in doing so.  The patient recently experienced a sinus infection, for which he sought care at an urgent care facility and later the emergency department due to worsening symptoms. He was prescribed Augmentin and prednisone, which he reports has led to significant improvement in his symptoms over the past few days.  The patient also reports a history of restless leg syndrome, previously treated with gabapentin. However, he discontinued this medication due to unpleasant side effects, including disturbing dreams and a general feeling of unwellness. He has been self-managing his symptoms with occasional alcohol use, which he acknowledges is not ideal given his cardiovascular history.  Lastly, the patient has been struggling with tobacco cessation, having tried various methods in the past, including patches, gum, and medication, without success. He expressed a willingness to retry the patches and consider other pharmacological options if necessary.             Outpatient Encounter Medications as of 02/12/2023  Medication Sig   acetaminophen (TYLENOL) 500 MG  tablet Take 1,000 mg by mouth every 6 (six) hours as needed for moderate pain.   amLODipine (NORVASC) 5 MG tablet Take 1 tablet (5 mg total) by mouth daily.   amoxicillin-clavulanate (AUGMENTIN) 875-125 MG tablet Take 1 tablet by mouth every 12 (twelve) hours for 10 days.   aspirin EC 81 MG tablet Take 1 tablet (81 mg total) by mouth daily.   Naproxen Sodium 220 MG CAPS Take 220 mg by mouth as needed.   nicotine (NICODERM CQ - DOSED IN MG/24 HOURS) 14 mg/24hr patch RX #2 Weeks 5-6: 14 mg x 1 patch daily. Wear for 24 hours. If you have sleep disturbances, remove at bedtime.   nicotine (NICODERM CQ - DOSED IN MG/24 HOURS) 21 mg/24hr patch RX #1 Weeks 1-4: 21 mg x 1 patch daily. Wear for 24 hours. If you have sleep disturbances, remove at bedtime.   nicotine (NICODERM CQ - DOSED IN MG/24 HR) 7 mg/24hr patch RX #3 Weeks 7-8: 7 mg x 1 patch daily. Wear for 24 hours. If you have sleep disturbances, remove at bedtime.   omega-3 acid ethyl esters (LOVAZA) 1 g capsule Take 1 g by mouth daily.   pantoprazole (PROTONIX) 40 MG tablet Take 1 tablet (40 mg total) by mouth daily.   predniSONE (DELTASONE) 20 MG tablet Take 2 tablets (40 mg total) by mouth daily for 5 days.   rosuvastatin (CRESTOR) 40 MG tablet Take 1 tablet (40 mg total) by mouth daily.   [DISCONTINUED] benzonatate (TESSALON) 100 MG capsule Take 1 capsule (100 mg total) by mouth every 8 (eight) hours as needed for cough.   [DISCONTINUED] fluticasone (FLONASE) 50 MCG/ACT nasal spray Place  1 spray into both nostrils daily.   No facility-administered encounter medications on file as of 02/12/2023.    Past Medical History:  Diagnosis Date   Anxiety 11/23/2018   CAD (coronary artery disease)    S/P cardiac arrest 11/21/18, Cath-MVD, 11/25/18-3V CABG and LA clipping   Hyperlipidemia LDL goal <70    Hypertension    Myocardial infarction (HCC)    Restless leg    Tobacco abuse     Past Surgical History:  Procedure Laterality Date   CLIPPING OF  ATRIAL APPENDAGE N/A 11/25/2018   Procedure: CLIPPING OF ATRIAL APPENDAGE;  Surgeon: Corliss Skains, MD;  Location: [facility] OR;  Service: Open Heart Surgery;  Laterality: N/A;  FLOW TRACK   CORONARY ARTERY BYPASS GRAFT N/A 11/25/2018   Procedure: CORONARY ARTERY BYPASS GRAFTING (CABG) times three, using left mammary artery and right greater saphenous vein harvested endoscopically;  Surgeon: Corliss Skains, MD;  Location: [facility] OR;  Service: Open Heart Surgery;  Laterality: N/A;   INGUINAL HERNIA REPAIR Left 07/15/2021   Procedure: OPEN LEFT INGUINAL HERNIA REPAIR WITH MESH;  Surgeon: Kinsinger, De Blanch, MD;  Location: [facility];  Service: General;  Laterality: Left;   LEFT HEART CATH AND CORONARY ANGIOGRAPHY N/A 11/21/2018   Procedure: LEFT HEART CATH AND CORONARY ANGIOGRAPHY;  Surgeon: Kathleene Hazel, MD;  Location: [facility] INVASIVE CV LAB;  Service: Cardiovascular;  Laterality: N/A;   TEE WITHOUT CARDIOVERSION N/A 11/25/2018   Procedure: TRANSESOPHAGEAL ECHOCARDIOGRAM (TEE);  Surgeon: Corliss Skains, MD;  Location: [facility] OR;  Service: Open Heart Surgery;  Laterality: N/A;    Family History  Problem Relation Age of Onset   Hypertension Father    Prostate cancer Neg Hx    Bladder Cancer Neg Hx    Kidney cancer Neg Hx     Social History   Socioeconomic History   Marital status: Married    Spouse name: Not on file   Number of children: Not on file   Years of education: Not on file   Highest education level: Not on file  Occupational History   Not on file  Tobacco Use   Smoking status: Every Day    Current packs/day: 1.00    Average packs/day: 1 pack/day for 35.0 years (35.0 ttl pk-yrs)    Types: Cigarettes   Smokeless tobacco: Never  Vaping Use   Vaping status: Never Used  Substance and Sexual Activity   Alcohol use: Not Currently   Drug use: Never   Sexual activity: Not on file  Other Topics Concern   Not on file  Social History Narrative   Not on file   Social  Determinants of Health   Financial Resource Strain: Not on file  Food Insecurity: Not on file  Transportation Needs: Not on file  Physical Activity: Not on file  Stress: Not on file  Social Connections: Not on file  Intimate Partner Violence: Not on file    ROS All review of systems negative except what is listed in the HPI      Objective    BP 121/68   Pulse 89   Ht 5\' 8"  (1.727 m)   Wt 206 lb (93.4 kg)   SpO2 98%   BMI 31.32 kg/m   Physical Exam Vitals reviewed.  Constitutional:      Appearance: Normal appearance.  HENT:     Nose: Congestion present.  Cardiovascular:     Rate and Rhythm: Normal rate and regular rhythm.     Heart sounds:  Normal heart sounds.  Pulmonary:     Effort: Pulmonary effort is normal.     Breath sounds: Normal breath sounds.  Skin:    General: Skin is warm and dry.  Neurological:     Mental Status: He is alert and oriented to person, place, and time.  Psychiatric:        Mood and Affect: Mood normal.        Behavior: Behavior normal.        Thought Content: Thought content normal.        Judgment: Judgment normal.         Assessment & Plan:   Problem List Items Addressed This Visit       Active Problems   S/P CABG x 3    History of triple bypass in 2020, currently managed by a cardiologist. -Continue current management with cardiologist.      Cigarette nicotine dependence without complication - Primary    Longstanding history of smoking, approximately 1 pack per day for 40 years. Expressed interest in smoking cessation. -Initiate nicotine patch therapy for smoking cessation. -Consider additional pharmacotherapy if nicotine patches are not successful.      Relevant Medications   nicotine (NICODERM CQ - DOSED IN MG/24 HOURS) 21 mg/24hr patch   nicotine (NICODERM CQ - DOSED IN MG/24 HOURS) 14 mg/24hr patch   nicotine (NICODERM CQ - DOSED IN MG/24 HR) 7 mg/24hr patch   Other Relevant Orders   Ambulatory Referral Lung  Cancer Screening [facility]   Primary hypertension    Blood pressure is at goal for age and co-morbidities.   Recommendations: amlodipine 5 mg daily,  - BP goal <130/80 - monitor and log blood pressures at home - check around the same time each day in a relaxed setting - Limit salt to <2000 mg/day - Follow DASH eating plan (heart healthy diet) - limit alcohol to 2 standard drinks per day for men and 1 per day for women - avoid tobacco products - get at least 2 hours of regular aerobic exercise weekly Patient aware of signs/symptoms requiring further/urgent evaluation. Labs deferred today.       Mixed hyperlipidemia    Medication management: crestor 40 mg daily, Lovaza Lifestyle factors for lowering cholesterol include: Diet therapy - heart-healthy diet rich in fruits, veggies, fiber-rich whole grains, lean meats, chicken, fish (at least twice a week), fat-free or 1% dairy products; foods low in saturated/trans fats, cholesterol, sodium, and sugar. Mediterranean diet has shown to be very heart healthy. Regular exercise - recommend at least 30 minutes a day, 5 times per week Weight management         Coronary artery disease involving native coronary artery of native heart without angina pectoris    CABG 2020, following with cardiology Taking 81 mg ASA daily, HTN/HLD management as above No symptoms       Restless leg syndrome    History of restless leg syndrome, previously tried Gabapentin with poor tolerance due to side effects. -Consider alternative pharmacotherapy options at future visit if symptoms persist or worsen. Not interested in starting anything right now. Supportive measures discussed and handout provided.       Other Visit Diagnoses     Encounter for medical examination to establish care       Screen for colon cancer       Relevant Orders   Ambulatory referral to Gastroenterology       Return in about 3 months (around 05/15/2023) for routine follow-up.  Clayborne Dana, NP

## 2023-02-12 NOTE — Patient Instructions (Signed)
Thank you for choosing [facility] at [facility] for your Primary Care needs. I am excited for the opportunity to partner with you to meet your health care goals. It was a pleasure meeting you today!  Information on diet, exercise, and health maintenance recommendations are listed below. This is information to help you be sure you are on track for optimal health and monitoring.   Please look over this and let us know if you have any questions or if you have completed any of the health maintenance outside of [facility] so that we can be sure your records are up to date.  ___________________________________________________________  MyChart:  For all urgent or time sensitive needs we ask that you please call the office to avoid delays. Our number is [phone]. MyChart is not constantly monitored and due to the large volume of messages a day, replies may take up to 72 business hours.  MyChart Policy: MyChart allows for you to see your visit notes, after visit summary, provider recommendations, lab and tests results, make an appointment, request refills, and contact your provider or the office for non-urgent questions or concerns. Providers are seeing patients during normal business hours and do not have built in time to review MyChart messages.  We ask that you allow a minimum of 3 business days for responses to [employer]. For this reason, please do not send urgent requests through MyChart. Please call the office at [phone]. New and ongoing conditions may require a visit. We have virtual and in-person visits available for your convenience.  Complex MyChart concerns may require a visit. Your provider may request you schedule a virtual or in-person visit to ensure we are providing the best care possible. MyChart messages sent after 11:00 AM on Friday will not be received by the provider until Monday morning.    Lab and Test Results: You will receive your lab and test  results on MyChart as soon as they are completed and results have been sent by the lab or testing facility. Due to this service, you will receive your results BEFORE your provider.  I review lab and test results each morning prior to seeing patients. Some results require collaboration with other providers to ensure you are receiving the most appropriate care. For this reason, we ask that you please allow a minimum of 3-5 business days from the time that ALL results have been received for your provider to receive and review lab and test results and contact you about these.  Most lab and test result comments from the provider will be sent through MyChart. Your provider may recommend changes to the plan of care, follow-up visits, repeat testing, ask questions, or request an office visit to discuss these results. You may reply directly to this message or call the office to provide information for the provider or set up an appointment. In some instances, you will be called with test results and recommendations. Please let us know if this is preferred and we will make note of this in your chart to provide this for you.    If you have not heard a response to your lab or test results in 5 business days from all results returning to MyChart, please call the office to let us know. We ask that you please avoid calling prior to this time unless there is an emergent concern. Due to high call volumes, this can delay the resulting process.  After Hours: For all non-emergency after hours needs, please  call the office at [phone] and select the option to reach the on-call  service. On-call services are shared between multiple [facility] offices and therefore it will not be possible to speak directly with your provider. On-call providers may provide medical advice and recommendations, but are unable to provide refills for maintenance medications.  For all emergency or urgent medical needs after normal business hours, we  recommend that you seek care at the closest Urgent Care or Emergency Department to ensure appropriate treatment in a timely manner.  [facility] has a 24 hour emergency room located on the ground floor for your convenience.   Urgent Concerns During the Business Day Providers are seeing patients from 8AM to 5PM with a busy schedule and are most often not able to respond to non-urgent calls until the end of the day or the next business day. If you should have URGENT concerns during the day, please call and speak to the nurse or schedule a same day appointment so that we can address your concern without delay.   Thank you, again, for choosing me as your health care partner. I appreciate your trust and look forward to learning more about you!   Lollie Marrow Reola Calkins, DNP, FNP-C  ___________________________________________________________  Health Maintenance Recommendations Screening Testing Mammogram Every 1-2 years based on history and risk factors Starting at age 59 Pap Smear Ages 21-39 every 3 years Ages 77-65 every 5 years with HPV testing More frequent testing may be required based on results and history Colon Cancer Screening Every 1-10 years based on test performed, risk factors, and history Starting at age 24 Bone Density Screening Every 2-10 years based on history Starting at age 51 for women Recommendations for men differ based on medication usage, history, and risk factors AAA Screening One time ultrasound Men 90-69 years old who have ever smoked Lung Cancer Screening Low Dose Lung CT every 12 months Age 79-80 years with a 20 pack-year smoking history who still smoke or who have quit within the last 15 years  Screening Labs Routine  Labs: Complete Blood Count (CBC), Complete Metabolic Panel (CMP), Cholesterol (Lipid Panel) Every 6-12 months based on history and medications May be recommended more frequently based on current conditions or previous results Hemoglobin  A1c Lab Every 3-12 months based on history and previous results Starting at age 53 or earlier with diagnosis of diabetes, high cholesterol, BMI >26, and/or risk factors Frequent monitoring for patients with diabetes to ensure blood sugar control Thyroid Panel  Every 6 months based on history, symptoms, and risk factors May be repeated more often if on medication HIV One time testing for all patients 45 and older May be repeated more frequently for patients with increased risk factors or exposure Hepatitis C One time testing for all patients 62 and older May be repeated more frequently for patients with increased risk factors or exposure Gonorrhea, Chlamydia Every 12 months for all sexually active persons 13-24 years Additional monitoring may be recommended for those who are considered high risk or who have symptoms PSA Men 80-87 years old with risk factors Additional screening may be recommended from age 61-69 based on risk factors, symptoms, and history  Vaccine Recommendations Tetanus Booster All adults every 10 years Flu Vaccine All patients 6 months and older every year COVID Vaccine All patients 12 years and older Initial dosing with booster May recommend additional booster based on age and health history HPV Vaccine 2 doses all patients age 59-26 Dosing may be considered  for patients over 26 Shingles Vaccine (Shingrix) 2 doses all adults 50 years and older Pneumonia (Pneumovax 23) All adults 65 years and older May recommend earlier dosing based on health history Pneumonia (Prevnar 71) All adults 65 years and older Dosed 1 year after Pneumovax 23 Pneumonia (Prevnar 20) All adults 65 years and older (adults 19-64 with certain conditions or risk factors) 1 dose  For those who have not received Prevnar 13 vaccine previously   Additional Screening, Testing, and Vaccinations may be recommended on an individualized basis based on family history, health history, risk  factors, and/or exposure.  __________________________________________________________  Diet Recommendations for All Patients  I recommend that all patients maintain a diet low in saturated fats, carbohydrates, and cholesterol. While this can be challenging at first, it is not impossible and small changes can make big differences.  Things to try: Decreasing the amount of soda, sweet tea, and/or juice to one or less per day and replace with water While water is always the first choice, if you do not like water you may consider adding a water additive without sugar to improve the taste other sugar free drinks Replace potatoes with a brightly colored vegetable  Use healthy oils, such as canola oil or olive oil, instead of butter or hard margarine Limit your bread intake to two pieces or less a day Replace regular pasta with low carb pasta options Bake, broil, or grill foods instead of frying Monitor portion sizes  Eat smaller, more frequent meals throughout the day instead of large meals  An important thing to remember is, if you love foods that are not great for your health, you don't have to give them up completely. Instead, allow these foods to be a reward when you have done well. Allowing yourself to still have special treats every once in a while is a nice way to tell yourself thank you for working hard to keep yourself healthy.   Also remember that every day is a new day. If you have a bad day and "fall off the wagon", you can still climb right back up and keep moving along on your journey!  We have resources available to help you!  Some websites that may be helpful include: www.http://www.wall-moore.info/  Www.VeryWellFit.com _____________________________________________________________  Activity Recommendations for All Patients  I recommend that all adults get at least 30 minutes of moderate physical activity that elevates your heart rate at least 5 days out of the week.  Some examples  include: Walking or jogging at a pace that allows you to carry on a conversation Cycling (stationary bike or outdoors) Water aerobics Yoga Weight lifting Dancing If physical limitations prevent you from putting stress on your joints, exercise in a pool or seated in a chair are excellent options.  Do determine your MAXIMUM heart rate for activity: 220 - YOUR AGE = MAX Heart Rate   Remember! Do not push yourself too hard.  Start slowly and build up your pace, speed, weight, time in exercise, etc.  Allow your body to rest between exercise and get good sleep. You will need more water than normal when you are exerting yourself. Do not wait until you are thirsty to drink. Drink with a purpose of getting in at least 8, 8 ounce glasses of water a day plus more depending on how much you exercise and sweat.    If you begin to develop dizziness, chest pain, abdominal pain, jaw pain, shortness of breath, headache, vision changes, lightheadedness, or other concerning symptoms,  stop the activity and allow your body to rest. If your symptoms are severe, seek emergency evaluation immediately. If your symptoms are concerning, but not severe, please let us know so that we can recommend further evaluation.

## 2023-02-12 NOTE — Assessment & Plan Note (Signed)
History of triple bypass in 2020, currently managed by a cardiologist. -Continue current management with cardiologist.

## 2023-02-12 NOTE — Assessment & Plan Note (Addendum)
Medication management: crestor 40 mg daily, Lovaza Lifestyle factors for lowering cholesterol include: Diet therapy - heart-healthy diet rich in fruits, veggies, fiber-rich whole grains, lean meats, chicken, fish (at least twice a week), fat-free or 1% dairy products; foods low in saturated/trans fats, cholesterol, sodium, and sugar. Mediterranean diet has shown to be very heart healthy. Regular exercise - recommend at least 30 minutes a day, 5 times per week Weight management

## 2023-02-12 NOTE — Assessment & Plan Note (Signed)
CABG 2020, following with cardiology Taking 81 mg ASA daily, HTN/HLD management as above No symptoms

## 2023-02-20 ENCOUNTER — Encounter: Payer: Self-pay | Admitting: Gastroenterology

## 2023-03-05 ENCOUNTER — Ambulatory Visit: Payer: Medicaid Other

## 2023-03-05 VITALS — Ht 68.0 in | Wt 200.0 lb

## 2023-03-05 DIAGNOSIS — Z1211 Encounter for screening for malignant neoplasm of colon: Secondary | ICD-10-CM

## 2023-03-05 MED ORDER — PLENVU 140 G PO SOLR
1.0000 | Freq: Once | ORAL | 0 refills | Status: AC
Start: 2023-03-05 — End: 2023-03-05

## 2023-03-05 NOTE — Progress Notes (Signed)
No egg or soy allergy known to patient  No issues known to pt with past sedation with any surgeries or procedures Patient denies ever being told they had issues or difficulty with intubation  No FH of Malignant Hyperthermia Pt is not on diet pills Pt is not on  home 02  Pt is not on blood thinners  Pt denies issues with constipation  No A fib or A flutter Have any cardiac testing pending--no  LOA: independent  Prep: plenvu   Patient's chart reviewed by Cathlyn Parsons CNRA prior to previsit and patient appropriate for the [facility].  Previsit completed and red dot placed by patient's name on their procedure day (on provider's schedule).     PV competed with patient. Prep instructions sent via mychart and home address.

## 2023-03-16 ENCOUNTER — Encounter: Payer: Self-pay | Admitting: Gastroenterology

## 2023-03-16 ENCOUNTER — Ambulatory Visit: Payer: Medicaid Other | Admitting: Gastroenterology

## 2023-03-16 VITALS — BP 125/75 | HR 73 | Temp 98.6°F | Resp 18 | Ht 68.0 in | Wt 200.0 lb

## 2023-03-16 DIAGNOSIS — Z1211 Encounter for screening for malignant neoplasm of colon: Secondary | ICD-10-CM | POA: Diagnosis not present

## 2023-03-16 DIAGNOSIS — I2510 Atherosclerotic heart disease of native coronary artery without angina pectoris: Secondary | ICD-10-CM | POA: Diagnosis not present

## 2023-03-16 DIAGNOSIS — D122 Benign neoplasm of ascending colon: Secondary | ICD-10-CM

## 2023-03-16 DIAGNOSIS — G2581 Restless legs syndrome: Secondary | ICD-10-CM | POA: Diagnosis not present

## 2023-03-16 DIAGNOSIS — D123 Benign neoplasm of transverse colon: Secondary | ICD-10-CM | POA: Diagnosis not present

## 2023-03-16 DIAGNOSIS — D124 Benign neoplasm of descending colon: Secondary | ICD-10-CM

## 2023-03-16 DIAGNOSIS — F419 Anxiety disorder, unspecified: Secondary | ICD-10-CM | POA: Diagnosis not present

## 2023-03-16 DIAGNOSIS — I252 Old myocardial infarction: Secondary | ICD-10-CM | POA: Diagnosis not present

## 2023-03-16 MED ORDER — SODIUM CHLORIDE 0.9 % IV SOLN: 500.0000 mL | INTRAVENOUS | Status: DC

## 2023-03-16 NOTE — Op Note (Signed)
[facility] Patient Name: George Munoz Procedure Date: 03/16/2023 10:05 AM MRN: [id number] Endoscopist: Lynann Bologna , MD, [id number] Age: 52 Referring MD:  Date of Birth: 1970/09/14 Gender: Male Account #: [id number] Procedure:                Colonoscopy Indications:              Screening for colorectal malignant neoplasm Medicines:                Monitored Anesthesia Care Procedure:                Pre-Anesthesia Assessment:                           - Prior to the procedure, a History and Physical                            was performed, and patient medications and                            allergies were reviewed. The patient's tolerance of                            previous anesthesia was also reviewed. The risks                            and benefits of the procedure and the sedation                            options and risks were discussed with the patient.                            All questions were answered, and informed consent                            was obtained. Prior Anticoagulants: The patient has                            taken no anticoagulant or antiplatelet agents. ASA                            Grade Assessment: II - A patient with mild systemic                            disease. After reviewing the risks and benefits,                            the patient was deemed in satisfactory condition to                            undergo the procedure.                           After obtaining informed consent, the colonoscope  was passed under direct vision. Throughout the                            procedure, the patient's blood pressure, pulse, and                            oxygen saturations were monitored continuously. The                            CF [id number] #[id number] was introduced through the anus                            and advanced to the the cecum, identified by                            appendiceal  orifice and ileocecal valve. The                            colonoscopy was performed without difficulty. The                            patient tolerated the procedure well. The quality                            of the bowel preparation was good. The ileocecal                            valve, appendiceal orifice, and rectum were                            photographed. Scope In: 10:27:04 AM Scope Out: 10:39:28 AM Scope Withdrawal Time: 0 hours 9 minutes 44 seconds  Total Procedure Duration: 0 hours 12 minutes 24 seconds  Findings:                 Two sessile polyps were found in the proximal                            transverse colon and proximal ascending colon (1                            cm). The polyps were 6 to 10 mm in size. These                            polyps were removed with a cold snare. Resection                            and retrieval were complete.                           A 6 mm polyp was found in the mid descending colon.                            The polyp was sessile.  The polyp was removed with a                            cold snare. Resection and retrieval were complete.                           A few medium-mouthed diverticula were found in the                            sigmoid colon.                           Non-bleeding internal hemorrhoids were found during                            retroflexion. The hemorrhoids were small and Grade                            I (internal hemorrhoids that do not prolapse).                           The exam was otherwise without abnormality on                            direct and retroflexion views. Complications:            No immediate complications. Estimated Blood Loss:     Estimated blood loss: none. Impression:               - Two 6 to 10 mm polyps in the proximal transverse                            colon and in the proximal ascending colon, removed                            with a cold snare. Resected and  retrieved.                           - One 6 mm polyp in the mid descending colon,                            removed with a cold snare. Resected and retrieved.                           - Mild sigmoid diverticulosis.                           - Non-bleeding internal hemorrhoids.                           - The examination was otherwise normal on direct                            and retroflexion views. Recommendation:           - Patient has a contact  number available for                            emergencies. The signs and symptoms of potential                            delayed complications were discussed with the                            patient. Return to normal activities tomorrow.                            Written discharge instructions were provided to the                            patient.                           - Resume previous diet.                           - Continue present medications.                           - Await pathology results.                           - Repeat colonoscopy for surveillance based on                            pathology results.                           - The findings and recommendations were discussed                            with the patient's family. Lynann Bologna, MD 03/16/2023 10:48:51 AM This report has been signed electronically.

## 2023-03-16 NOTE — Progress Notes (Signed)
[facility] History and Physical   Primary Care Physician:  Clayborne Dana, NP   Reason for Procedure:  CRC screening  Plan:    Colonoscopy     HPI: George Munoz is a 52 y.o. male    Past Medical History:  Diagnosis Date   Anxiety 11/23/2018   CAD (coronary artery disease)    S/P cardiac arrest 11/21/18, Cath-MVD, 11/25/18-3V CABG and LA clipping   Hyperlipidemia LDL goal <70    Hypertension    Myocardial infarction (HCC)    Restless leg    Tobacco abuse     Past Surgical History:  Procedure Laterality Date   CLIPPING OF ATRIAL APPENDAGE N/A 11/25/2018   Procedure: CLIPPING OF ATRIAL APPENDAGE;  Surgeon: Corliss Skains, MD;  Location: [facility] OR;  Service: Open Heart Surgery;  Laterality: N/A;  FLOW TRACK   CORONARY ARTERY BYPASS GRAFT N/A 11/25/2018   Procedure: CORONARY ARTERY BYPASS GRAFTING (CABG) times three, using left mammary artery and right greater saphenous vein harvested endoscopically;  Surgeon: Corliss Skains, MD;  Location: [facility] OR;  Service: Open Heart Surgery;  Laterality: N/A;   INGUINAL HERNIA REPAIR Left 07/15/2021   Procedure: OPEN LEFT INGUINAL HERNIA REPAIR WITH MESH;  Surgeon: Kinsinger, De Blanch, MD;  Location: [facility] OR;  Service: General;  Laterality: Left;   LEFT HEART CATH AND CORONARY ANGIOGRAPHY N/A 11/21/2018   Procedure: LEFT HEART CATH AND CORONARY ANGIOGRAPHY;  Surgeon: Kathleene Hazel, MD;  Location: [facility] INVASIVE CV LAB;  Service: Cardiovascular;  Laterality: N/A;   TEE WITHOUT CARDIOVERSION N/A 11/25/2018   Procedure: TRANSESOPHAGEAL ECHOCARDIOGRAM (TEE);  Surgeon: Corliss Skains, MD;  Location: [facility] OR;  Service: Open Heart Surgery;  Laterality: N/A;    Prior to Admission medications   Medication Sig Start Date End Date Taking? Authorizing Provider  amLODipine (NORVASC) 5 MG tablet Take 1 tablet (5 mg total) by mouth daily. 12/05/22  Yes Conte, Tessa N, PA-C  pantoprazole (PROTONIX) 40 MG tablet Take 1 tablet  (40 mg total) by mouth daily. 12/05/22  Yes Conte, Tessa N, PA-C  rosuvastatin (CRESTOR) 40 MG tablet Take 1 tablet (40 mg total) by mouth daily. 12/05/22  Yes Conte, Tessa N, PA-C  acetaminophen (TYLENOL) 500 MG tablet Take 1,000 mg by mouth every 6 (six) hours as needed for moderate pain.    [provider]  aspirin EC 81 MG tablet Take 1 tablet (81 mg total) by mouth daily. 11/29/18   Ardelle Balls, PA-C  omega-3 acid ethyl esters (LOVAZA) 1 g capsule Take 1 g by mouth daily.    [provider]    Current Outpatient Medications  Medication Sig Dispense Refill   amLODipine (NORVASC) 5 MG tablet Take 1 tablet (5 mg total) by mouth daily. 90 tablet 3   pantoprazole (PROTONIX) 40 MG tablet Take 1 tablet (40 mg total) by mouth daily. 90 tablet 3   rosuvastatin (CRESTOR) 40 MG tablet Take 1 tablet (40 mg total) by mouth daily. 90 tablet 3   acetaminophen (TYLENOL) 500 MG tablet Take 1,000 mg by mouth every 6 (six) hours as needed for moderate pain.     aspirin EC 81 MG tablet Take 1 tablet (81 mg total) by mouth daily.     omega-3 acid ethyl esters (LOVAZA) 1 g capsule Take 1 g by mouth daily.     Current Facility-Administered Medications  Medication Dose Route Frequency Provider Last Rate Last Admin   0.9 %  sodium chloride infusion  500  mL Intravenous Continuous Lynann Bologna, MD        Allergies as of 03/16/2023   (No Known Allergies)    Family History  Problem Relation Age of Onset   Hypertension Father    Lung cancer Father    Prostate cancer Neg Hx    Bladder Cancer Neg Hx    Kidney cancer Neg Hx    Colon cancer Neg Hx    Colon polyps Neg Hx    Rectal cancer Neg Hx    Stomach cancer Neg Hx     Social History   Socioeconomic History   Marital status: Married    Spouse name: Not on file   Number of children: Not on file   Years of education: Not on file   Highest education level: Not on file  Occupational History   Not on file  Tobacco Use    Smoking status: Every Day    Current packs/day: 1.00    Average packs/day: 1 pack/day for 35.0 years (35.0 ttl pk-yrs)    Types: Cigarettes   Smokeless tobacco: Never  Vaping Use   Vaping status: Never Used  Substance and Sexual Activity   Alcohol use: Not Currently   Drug use: Never   Sexual activity: Not on file  Other Topics Concern   Not on file  Social History Narrative   Not on file   Social Determinants of Health   Financial Resource Strain: Not on file  Food Insecurity: Not on file  Transportation Needs: Not on file  Physical Activity: Not on file  Stress: Not on file  Social Connections: Not on file  Intimate Partner Violence: Not on file    Review of Systems: Positive for none All other review of systems negative except as mentioned in the HPI.  Physical Exam: Vital signs in last 24 hours: @VSRANGES @   General:   Alert,  Well-developed, well-nourished, pleasant and cooperative in NAD Lungs:  Clear throughout to auscultation.   Heart:  Regular rate and rhythm; no murmurs, clicks, rubs,  or gallops. Abdomen:  Soft, nontender and nondistended. Normal bowel sounds.   Neuro/Psych:  Alert and cooperative. Normal mood and affect. A and O x 3    No significant changes were identified.  The patient continues to be an appropriate candidate for the planned procedure and anesthesia.   Edman Circle, MD. [facility] 03/16/2023 10:17 AM@

## 2023-03-16 NOTE — Progress Notes (Signed)
Sedate, gd SR, tolerated procedure well, VSS, report to RN 

## 2023-03-16 NOTE — Progress Notes (Signed)
Called to room to assist during endoscopic procedure.  Patient ID and intended procedure confirmed with present staff. Received instructions for my participation in the procedure from the performing physician.  

## 2023-03-16 NOTE — Patient Instructions (Signed)
 Educational handout provided to patient related to Hemorrhoids, Polyps, and Diverticulosis  Resume previous diet  Continue present medications  Awaiting pathology results   YOU HAD AN ENDOSCOPIC PROCEDURE TODAY AT THE [facility]:   Refer to the procedure report that was given to you for any specific questions about what was found during the examination.  If the procedure report does not answer your questions, please call your gastroenterologist to clarify.  If you requested that your care partner not be given the details of your procedure findings, then the procedure report has been included in a sealed envelope for you to review at your convenience later.  YOU SHOULD EXPECT: Some feelings of bloating in the abdomen. Passage of more gas than usual.  Walking can help get rid of the air that was put into your GI tract during the procedure and reduce the bloating. If you had a lower endoscopy (such as a colonoscopy or flexible sigmoidoscopy) you may notice spotting of blood in your stool or on the toilet paper. If you underwent a bowel prep for your procedure, you may not have a normal bowel movement for a few days.  Please Note:  You might notice some irritation and congestion in your nose or some drainage.  This is from the oxygen used during your procedure.  There is no need for concern and it should clear up in a day or so.  SYMPTOMS TO REPORT IMMEDIATELY:  Following lower endoscopy (colonoscopy or flexible sigmoidoscopy):  Excessive amounts of blood in the stool  Significant tenderness or worsening of abdominal pains  Swelling of the abdomen that is new, acute  Fever of 100F or higher   For urgent or emergent issues, a gastroenterologist can be reached at any hour by calling [phone]. Do not use MyChart messaging for urgent concerns.    DIET:  We do recommend a small meal at first, but then you may proceed to your regular diet.  Drink plenty of fluids but you should  avoid alcoholic beverages for 24 hours.  ACTIVITY:  You should plan to take it easy for the rest of today and you should NOT DRIVE or use heavy machinery until tomorrow (because of the sedation medicines used during the test).    FOLLOW UP: Our staff will call the number listed on your records the next business day following your procedure.  We will call around 7:15- 8:00 am to check on you and address any questions or concerns that you may have regarding the information given to you following your procedure. If we do not reach you, we will leave a message.     If any biopsies were taken you will be contacted by phone or by letter within the next 1-3 weeks.  Please call us at [phone] if you have not heard about the biopsies in 3 weeks.    SIGNATURES/CONFIDENTIALITY: You and/or your care partner have signed paperwork which will be entered into your electronic medical record.  These signatures attest to the fact that that the information above on your After Visit Summary has been reviewed and is understood.  Full responsibility of the confidentiality of this discharge information lies with you and/or your care-partner.

## 2023-03-16 NOTE — Progress Notes (Signed)
Patient states there have been no changes to medical or surgical history since time of pre-visit. 

## 2023-03-17 ENCOUNTER — Telehealth: Payer: Self-pay

## 2023-03-17 NOTE — Telephone Encounter (Signed)
No answer, unable to leave a message, B.Furqan Gosselin RN. 

## 2023-03-18 LAB — SURGICAL PATHOLOGY

## 2023-03-19 ENCOUNTER — Encounter: Payer: Self-pay | Admitting: Gastroenterology

## 2023-05-12 ENCOUNTER — Encounter: Payer: Self-pay | Admitting: Family Medicine

## 2023-05-12 ENCOUNTER — Ambulatory Visit: Payer: Medicaid Other | Admitting: Family Medicine

## 2023-05-12 VITALS — BP 140/80 | HR 97 | Temp 98.1°F | Ht 68.0 in | Wt 212.0 lb

## 2023-05-12 DIAGNOSIS — I10 Essential (primary) hypertension: Secondary | ICD-10-CM | POA: Diagnosis not present

## 2023-05-12 DIAGNOSIS — J0140 Acute pansinusitis, unspecified: Secondary | ICD-10-CM

## 2023-05-12 DIAGNOSIS — E782 Mixed hyperlipidemia: Secondary | ICD-10-CM | POA: Diagnosis not present

## 2023-05-12 LAB — COMPREHENSIVE METABOLIC PANEL
ALT: 22 U/L (ref 0–53)
AST: 18 U/L (ref 0–37)
Albumin: 4.4 g/dL (ref 3.5–5.2)
Alkaline Phosphatase: 94 U/L (ref 39–117)
BUN: 13 mg/dL (ref 6–23)
CO2: 27 meq/L (ref 19–32)
Calcium: 9.5 mg/dL (ref 8.4–10.5)
Chloride: 104 meq/L (ref 96–112)
Creatinine, Ser: 0.94 mg/dL (ref 0.40–1.50)
GFR: 92.93 mL/min (ref >60.00)
Glucose, Bld: 118 mg/dL — ABNORMAL HIGH (ref 70–99)
Potassium: 4.1 meq/L (ref 3.5–5.1)
Sodium: 138 meq/L (ref 135–145)
Total Bilirubin: 0.4 mg/dL (ref 0.2–1.2)
Total Protein: 6.9 g/dL (ref 6.0–8.3)

## 2023-05-12 LAB — LIPID PANEL
Cholesterol: 162 mg/dL (ref 0–200)
HDL: 42.3 mg/dL (ref >39.00)
LDL Cholesterol: 73 mg/dL (ref 0–99)
NonHDL: 119.31
Total CHOL/HDL Ratio: 4
Triglycerides: 231 mg/dL — ABNORMAL HIGH (ref 0.0–149.0)
VLDL: 46.2 mg/dL — ABNORMAL HIGH (ref 0.0–40.0)

## 2023-05-12 LAB — TSH: TSH: 5.08 u[IU]/mL (ref 0.35–5.50)

## 2023-05-12 MED ORDER — DOXYCYCLINE HYCLATE 100 MG PO TABS
100.0000 mg | ORAL_TABLET | Freq: Two times a day (BID) | ORAL | 0 refills | Status: AC
Start: 2023-05-12 — End: 2023-05-19

## 2023-05-12 MED ORDER — GUAIFENESIN ER 600 MG PO TB12
1200.0000 mg | ORAL_TABLET | Freq: Two times a day (BID) | ORAL | 0 refills | Status: DC
Start: 2023-05-12 — End: 2023-06-15

## 2023-05-12 MED ORDER — FLUTICASONE PROPIONATE 50 MCG/ACT NA SUSP
2.0000 | Freq: Every day | NASAL | 6 refills | Status: AC
Start: 2023-05-12 — End: ?

## 2023-05-12 MED ORDER — BENZONATATE 100 MG PO CAPS
100.0000 mg | ORAL_CAPSULE | Freq: Two times a day (BID) | ORAL | 0 refills | Status: DC | PRN
Start: 2023-05-12 — End: 2023-06-15

## 2023-05-12 NOTE — Assessment & Plan Note (Signed)
 Medication management: crestor  40 mg daily, Lovaza  Lifestyle factors for lowering cholesterol include: Diet therapy - heart-healthy diet rich in fruits, veggies, fiber-rich whole grains, lean meats, chicken, fish (at least twice a week), fat-free or 1% dairy products; foods low in saturated/trans fats, cholesterol, sodium, and sugar. Mediterranean diet has shown to be very heart healthy. Regular exercise - recommend at least 30 minutes a day, 5 times per week Weight management  Labs today, not fasting

## 2023-05-12 NOTE — Patient Instructions (Signed)
   Over the counter medications that may be helpful for symptoms:  Guaifenesin 1200 mg extended release tabs twice daily, with plenty of water For cough and congestion Brand name: Mucinex   Pseudoephedrine 30 mg, one or two tabs every 4 to 6 hours For sinus congestion Brand name: Sudafed You must get this from the pharmacy counter.  Oxymetazoline nasal spray each morning, one spray in each nostril, for NO MORE THAN 3 days  For nasal and sinus congestion Brand name: Afrin Saline nasal spray or Saline Nasal Irrigation (Netti Pot, etc) 3-5 times a day For nasal and sinus congestion Brand names: Ocean or AYR Fluticasone nasal spray OR Mometasone nasal spray OR Triamcinolone Acetonide nasal spray - follow directions on the packaging For nasal and sinus congestion Brand name: Flonase, Nasonex, Nasacort Warm salt water gargles  For sore throat Every few hours as needed Alternate ibuprofen 400-600 mg and acetaminophen 1000 mg every 6 hours For fever, body aches, headache Brand names: Motrin or Advil and Tylenol Dextromethorphan 12-hour cough version 30 mg every 12 hours  For cough Brand name: Delsym Stop all other cold medications for now (Nyquil, Dayquil, Tylenol Cold, Theraflu, etc) and other non-prescription cough/cold preparations. Many of these have the same ingredients listed above and could cause an overdose of medication.   Herbal treatments that have been shown to be helpful in some patients include: Vitamin C 1000 mg per day Zinc 100 mg per day Quercetin 25-500 mg twice a day Melatonin 5-10mg  at bedtime Honey Green Tea  General Instructions Allow your body to rest Drink PLENTY of fluids Typically, we are the most contagious 1-2 days before symptoms start through the first 2-3 days of most severe symptoms. Per CDC guidelines, you can return to school/work when symptoms have started to improve and you have been fever-free for 24 hours. However, recommend you continue extra  precautions for the following 5 days (frequent hand hygiene, masking, covering coughs/sneezes, minimize exposure to immunocompromised individuals, etc).  If you develop severe shortness of breath, uncontrolled fevers, coughing up blood, confusion, chest pain, or signs of dehydration (such as significantly decreased urine amounts or dizziness with standing) please go to the nearest ER.

## 2023-05-12 NOTE — Progress Notes (Signed)
 Established Patient Office Visit  Subjective   Patient ID: George Munoz, male    DOB: 01-17-1971  Age: 53 y.o. MRN: [id number]  Chief Complaint  Patient presents with   Medical Management of Chronic Issues   Sinus Problem     Discussed the use of AI scribe software for clinical note transcription with the patient, who gave verbal consent to proceed.  History of Present Illness   The patient, with a history of hypertension, hyperlipidemia, and gastroesophageal reflux disease, presents with symptoms suggestive of a sinus infection. They report the onset of symptoms last Thursday, with a sensation of pressure in the nasal area, nasal congestion, and rhinorrhea. The patient also describes a sensation of their right eye feeling as if it was going to pop out. Over the counter medications, including Nyquil and Sudafed, were initiated but symptoms worsened over the following days.  By Saturday, the patient developed a cough with a tickling sensation in the throat, which was so severe that it almost led to a loss of consciousness. They also experienced visual disturbances described as seeing sparklies or glitter, which resolved after a few minutes and have not reoccurred. The patient also reports feeling feverish, predominantly at night.  The patient also mentions a recent move and a change in pharmacy to [facility]. They confirm adherence to their current medication regimen, which includes amlodipine  5mg , rosuvastatin , pantoprazole , a multivitamin, and 81mg  aspirin  daily.  The patient denies any chest pain, headaches, swelling, or palpitations on a normal day, outside of the current illness. They also report no changes in their medication regimen. The patient does not monitor their blood pressure at home.       Hypertension: - Medications: amlodipine  5 mg daily - Compliance: good - Checking BP at home: no - Denies any SOB, recurrent headaches, CP, vision changes, LE edema, dizziness,  palpitations, or medication side effects. - Diet: general - Exercise: minimal    Hyperlipidemia: - medications: rosuvastatin  40 mg daily - compliance: good - medication SEs: none The ASCVD Risk score (Arnett DK, et al., 2019) failed to calculate for the following reasons:   Risk score cannot be calculated because patient has a medical history suggesting prior/existing ASCVD      ROS All review of systems negative except what is listed in the HPI    Objective:     BP (!) 140/80   Pulse 97   Temp 98.1 F (36.7 C) (Oral)   Ht 5' 8 (1.727 m)   Wt 212 lb (96.2 kg)   SpO2 99%   BMI 32.23 kg/m    Physical Exam Vitals reviewed.  Constitutional:      Appearance: Normal appearance.  HENT:     Head: Normocephalic and atraumatic.     Comments: Ethmoid/maxillary sinus tenderness    Right Ear: Tympanic membrane normal.     Left Ear: Tympanic membrane normal.     Nose: Congestion and rhinorrhea present.     Mouth/Throat:     Mouth: Mucous membranes are moist.     Pharynx: Oropharynx is clear. Posterior oropharyngeal erythema present.     Comments: Cobblestoning/PND Cardiovascular:     Rate and Rhythm: Normal rate and regular rhythm.     Heart sounds: Normal heart sounds.  Pulmonary:     Effort: Pulmonary effort is normal.     Breath sounds: Normal breath sounds.  Lymphadenopathy:     Cervical: No cervical adenopathy.  Skin:    General: Skin is warm and dry.  Neurological:  Mental Status: He is alert and oriented to person, place, and time.  Psychiatric:        Mood and Affect: Mood normal.        Behavior: Behavior normal.        Thought Content: Thought content normal.        Judgment: Judgment normal.      No results found for any visits on 05/12/23.    The ASCVD Risk score (Arnett DK, et al., 2019) failed to calculate for the following reasons:   Risk score cannot be calculated because patient has a medical history suggesting prior/existing ASCVD     Assessment & Plan:   Problem List Items Addressed This Visit       Active Problems   Primary hypertension - Primary   Blood pressure is not at goal for age and co-morbidities.  (Recent Sudafed use could be contributing)  Recommendations: continue amlodipine  5 mg daily, nurse visit recheck in 2-4 weeks - BP goal <130/80 - monitor and log blood pressures at home - check around the same time each day in a relaxed setting - Limit salt to <2000 mg/day - Follow DASH eating plan (heart healthy diet) - limit alcohol to 2 standard drinks per day for men and 1 per day for women - avoid tobacco products - get at least 2 hours of regular aerobic exercise weekly Patient aware of signs/symptoms requiring further/urgent evaluation. Labs today.       Relevant Orders   Comprehensive metabolic panel   TSH   Mixed hyperlipidemia   Medication management: crestor  40 mg daily, Lovaza  Lifestyle factors for lowering cholesterol include: Diet therapy - heart-healthy diet rich in fruits, veggies, fiber-rich whole grains, lean meats, chicken, fish (at least twice a week), fat-free or 1% dairy products; foods low in saturated/trans fats, cholesterol, sodium, and sugar. Mediterranean diet has shown to be very heart healthy. Regular exercise - recommend at least 30 minutes a day, 5 times per week Weight management  Labs today, not fasting        Relevant Orders   Comprehensive metabolic panel   Lipid panel   Other Visit Diagnoses       Acute non-recurrent pansinusitis     Severe sinus pain and pressure, nasal congestion, rhinorrhea, and postnasal drip leading to cough. No fever reported. Over-the-counter treatments (Nyquil, Sudafed) have not been effective. -Prescribe Doxycycline  for 1 week. -Discontinue Sudafed due to potential for increasing blood pressure. -Prescribe Flonase  nasal spray. -Prescribe Mucinex   -Prescribe cough medicine; tessalon  Continue supportive measures including rest,  hydration, humidifier use, steam showers, warm compresses to sinuses, warm liquids with lemon and honey, and over-the-counter cough, cold, and analgesics as needed.    Relevant Medications   doxycycline  (VIBRA -TABS) 100 MG tablet   fluticasone  (FLONASE ) 50 MCG/ACT nasal spray   guaiFENesin  (MUCINEX ) 600 MG 12 hr tablet   benzonatate  (TESSALON ) 100 MG capsule       Return for BP check with nurse; routine follow-up 6 months.    Waddell KATHEE Mon, NP

## 2023-05-12 NOTE — Assessment & Plan Note (Signed)
 Blood pressure is not at goal for age and co-morbidities.  (Recent Sudafed use could be contributing)  Recommendations: continue amlodipine  5 mg daily, nurse visit recheck in 2-4 weeks - BP goal <130/80 - monitor and log blood pressures at home - check around the same time each day in a relaxed setting - Limit salt to <2000 mg/day - Follow DASH eating plan (heart healthy diet) - limit alcohol to 2 standard drinks per day for men and 1 per day for women - avoid tobacco products - get at least 2 hours of regular aerobic exercise weekly Patient aware of signs/symptoms requiring further/urgent evaluation. Labs today.

## 2023-05-14 ENCOUNTER — Ambulatory Visit: Payer: Medicaid Other | Admitting: Family Medicine

## 2023-06-15 ENCOUNTER — Encounter: Payer: Self-pay | Admitting: Family Medicine

## 2023-06-15 ENCOUNTER — Ambulatory Visit (HOSPITAL_BASED_OUTPATIENT_CLINIC_OR_DEPARTMENT_OTHER)
Admission: RE | Admit: 2023-06-15 | Discharge: 2023-06-15 | Disposition: A | Discharge status: Home / Self Care | Payer: Medicaid Other | Source: Ambulatory Visit | Attending: Family Medicine | Admitting: Family Medicine

## 2023-06-15 ENCOUNTER — Ambulatory Visit: Payer: Medicaid Other | Admitting: Family Medicine

## 2023-06-15 ENCOUNTER — Ambulatory Visit: Payer: Self-pay | Admitting: Family Medicine

## 2023-06-15 VITALS — BP 137/83 | HR 81 | Temp 97.9°F | Ht 68.0 in | Wt 208.0 lb

## 2023-06-15 DIAGNOSIS — R509 Fever, unspecified: Secondary | ICD-10-CM | POA: Diagnosis not present

## 2023-06-15 DIAGNOSIS — R059 Cough, unspecified: Secondary | ICD-10-CM | POA: Diagnosis not present

## 2023-06-15 DIAGNOSIS — R0602 Shortness of breath: Secondary | ICD-10-CM | POA: Diagnosis not present

## 2023-06-15 DIAGNOSIS — R051 Acute cough: Secondary | ICD-10-CM | POA: Diagnosis not present

## 2023-06-15 DIAGNOSIS — R52 Pain, unspecified: Secondary | ICD-10-CM | POA: Diagnosis not present

## 2023-06-15 LAB — POCT INFLUENZA A/B
Influenza A, POC: NEGATIVE
Influenza B, POC: NEGATIVE

## 2023-06-15 LAB — POC COVID19 BINAXNOW: SARS Coronavirus 2 Ag: NEGATIVE

## 2023-06-15 MED ORDER — BENZONATATE 100 MG PO CAPS
100.0000 mg | ORAL_CAPSULE | Freq: Two times a day (BID) | ORAL | 0 refills | Status: AC | PRN
Start: 2023-06-15 — End: ?

## 2023-06-15 MED ORDER — GUAIFENESIN ER 600 MG PO TB12
1200.0000 mg | ORAL_TABLET | Freq: Two times a day (BID) | ORAL | 0 refills | Status: AC
Start: 2023-06-15 — End: ?

## 2023-06-15 MED ORDER — ALBUTEROL SULFATE HFA 108 (90 BASE) MCG/ACT IN AERS
2.0000 | INHALATION_SPRAY | Freq: Four times a day (QID) | RESPIRATORY_TRACT | 0 refills | Status: AC | PRN
Start: 2023-06-15 — End: ?

## 2023-06-15 NOTE — Patient Instructions (Signed)
Likely a viral upper respiratory infection  Continue supportive measures including rest, hydration, humidifier use, steam showers, warm compresses to sinuses, warm liquids with lemon and honey, and over-the-counter cough, cold, and analgesics as needed. If symptoms persist 8-10 days, become severe, or return after a few days of feeling better, then please follow-up for repeat evaluation to determine if antibiotics may be necessary. However, given your breath sounds today, will go ahead and get an xray to make sure we are not starting any pneumonia which would change our treatment plans - we will keep you updated on results.  Follow-up if not improving! Hope you feel better soon!  Over the counter medications that may be helpful for symptoms:  Guaifenesin 1200 mg extended release tabs twice daily, with plenty of water For cough and congestion Brand name: Mucinex   Pseudoephedrine 30 mg, one or two tabs every 4 to 6 hours For sinus congestion Brand name: Sudafed You must get this from the pharmacy counter.  Oxymetazoline nasal spray each morning, one spray in each nostril, for NO MORE THAN 3 days  For nasal and sinus congestion Brand name: Afrin Saline nasal spray or Saline Nasal Irrigation (Netti Pot, etc) 3-5 times a day For nasal and sinus congestion Brand names: Ocean or AYR Fluticasone nasal spray OR Mometasone nasal spray OR Triamcinolone Acetonide nasal spray - follow directions on the packaging For nasal and sinus congestion Brand name: Flonase, Nasonex, Nasacort Warm salt water gargles  For sore throat Every few hours as needed Alternate ibuprofen 400-600 mg and acetaminophen 1000 mg every 6 hours For fever, body aches, headache Brand names: Motrin or Advil and Tylenol Dextromethorphan 12-hour cough version 30 mg every 12 hours  For cough Brand name: Delsym Stop all other cold medications for now (Nyquil, Dayquil, Tylenol Cold, Theraflu, etc) and other non-prescription  cough/cold preparations. Many of these have the same ingredients listed above and could cause an overdose of medication.   Herbal treatments that have been shown to be helpful in some patients include: Vitamin C 1000 mg per day Zinc 100 mg per day Quercetin 25-500 mg twice a day Melatonin 5-10mg  at bedtime Honey Green Tea  General Instructions Allow your body to rest Drink PLENTY of fluids Typically, we are the most contagious 1-2 days before symptoms start through the first 2-3 days of most severe symptoms. Per CDC guidelines, you can return to school/work when symptoms have started to improve and you have been fever-free for 24 hours. However, recommend you continue extra precautions for the following 5 days (frequent hand hygiene, masking, covering coughs/sneezes, minimize exposure to immunocompromised individuals, etc).  If you develop severe shortness of breath, uncontrolled fevers, coughing up blood, confusion, chest pain, or signs of dehydration (such as significantly decreased urine amounts or dizziness with standing) please go to the nearest ER.

## 2023-06-15 NOTE — Progress Notes (Signed)
Acute Office Visit  Subjective:     Patient ID: George Munoz, male    DOB: 29-Dec-1970, 53 y.o.   MRN: [id number]  Chief Complaint  Patient presents with   Cough   Generalized Body Aches     Patient is in today for URI symptoms.    Discussed the use of AI scribe software for clinical note transcription with the patient, who gave verbal consent to proceed.  History of Present Illness   George Munoz "George Munoz" is a 53 year old male who presents with a persistent cough and upper respiratory symptoms.  He began experiencing a severe cough on Friday, accompanied by significant mucus production. Despite coughing, he has difficulty clearing the mucus. Over the weekend, he developed rhinorrhea, generalized myalgias, particularly in his joints, and occasional nausea. His appetite has been poor, and he has not eaten much.  He has been taking Nyquil to manage his symptoms. Nyquil has been effective in helping him sleep.  He suspects having had a fever, as he experienced excessive sweating and was advised by his boss to leave work due to appearing unwell. He does not have a thermometer to confirm the fever.  Currently, he feels that his nasal congestion has improved, but he perceives the mucus as draining into his chest, causing a burning sensation. He reports feeling mucus moving around in his chest and notes that his cough is productive, although he is unsure of the sound it makes. No recent exposure to sick individuals.  No wheezing at the moment but experienced it while lying in bed last night. He does not currently use an inhaler.            All review of systems negative except what is listed in the HPI      Objective:    BP 137/83   Pulse 81   Temp 97.9 F (36.6 C) (Oral)   Ht 5\' 8"  (1.727 m)   Wt 208 lb (94.3 kg)   SpO2 98%   BMI 31.63 kg/m    Physical Exam Vitals reviewed.  Constitutional:      Appearance: Normal appearance.  HENT:     Head:  Normocephalic and atraumatic.     Right Ear: Tympanic membrane normal.     Left Ear: Tympanic membrane normal.     Nose: Congestion and rhinorrhea present.     Mouth/Throat:     Comments: Postnasal drainage Cardiovascular:     Rate and Rhythm: Normal rate and regular rhythm.     Heart sounds: Normal heart sounds.  Pulmonary:     Effort: Pulmonary effort is normal.     Breath sounds: Wheezing and rhonchi present.  Musculoskeletal:     Cervical back: Normal range of motion.  Skin:    General: Skin is warm and dry.  Neurological:     Mental Status: He is alert and oriented to person, place, and time.  Psychiatric:        Mood and Affect: Mood normal.        Behavior: Behavior normal.        Thought Content: Thought content normal.        Judgment: Judgment normal.       Results for orders placed or performed in visit on 06/15/23  POC COVID-19 BinaxNow  Result Value Ref Range   SARS Coronavirus 2 Ag Negative Negative  POCT Influenza A/B  Result Value Ref Range   Influenza A, POC Negative Negative   Influenza B, POC Negative  Negative        Assessment & Plan:   Problem List Items Addressed This Visit   None Visit Diagnoses       Acute cough    -  Primary   Relevant Medications   albuterol (VENTOLIN HFA) 108 (90 Base) MCG/ACT inhaler   benzonatate (TESSALON) 100 MG capsule   guaiFENesin (MUCINEX) 600 MG 12 hr tablet   Other Relevant Orders   POC COVID-19 BinaxNow (Completed)   POCT Influenza A/B (Completed)   DG Chest 2 View     Body aches       Relevant Orders   POC COVID-19 BinaxNow (Completed)   POCT Influenza A/B (Completed)      Negative Flu and COVID.  Likely a viral upper respiratory infection  Continue supportive measures including rest, hydration, humidifier use, steam showers, warm compresses to sinuses, warm liquids with lemon and honey, and over-the-counter cough, cold, and analgesics as needed. If symptoms persist 8-10 days, become severe, or  return after a few days of feeling better, then please follow-up for repeat evaluation to determine if antibiotics may be necessary.  Given your breath sounds today, will go ahead and get an xray to make sure we are not starting any pneumonia which would change our treatment plans - we will keep you updated on results.  Follow-up if not improving! Hope you feel better soon!   Meds ordered this encounter  Medications   albuterol (VENTOLIN HFA) 108 (90 Base) MCG/ACT inhaler    Sig: Inhale 2 puffs into the lungs every 6 (six) hours as needed for wheezing or shortness of breath.    Dispense:  8 g    Refill:  0    Supervising Provider:   Danise Edge A [[id number]]   benzonatate (TESSALON) 100 MG capsule    Sig: Take 1 capsule (100 mg total) by mouth 2 (two) times daily as needed for cough.    Dispense:  20 capsule    Refill:  0    Supervising Provider:   Danise Edge A [[id number]]   guaiFENesin (MUCINEX) 600 MG 12 hr tablet    Sig: Take 2 tablets (1,200 mg total) by mouth 2 (two) times daily.    Dispense:  30 tablet    Refill:  0    Supervising Provider:   Danise Edge A [[id number]]    Return if symptoms worsen or fail to improve.  Clayborne Dana, NP

## 2023-06-15 NOTE — Telephone Encounter (Signed)
Copied from CRM [id number]. Topic: Clinical - Red Word Triage >> Jun 15, 2023  8:49 AM Mackie Pai E wrote: [facility] that prompted transfer to Nurse Triage: Patient is experiencing some chest pain, coughing, profusely sweating, and is running a fever. Patient did not have a temperature to give me for the fever, but claims he did have one. Patient also got sent home from work today 2/17 regarding his sickness.  Chief Complaint: flu like symptoms Symptoms: cough with cp, runny nose, wheezing, body aches, fever, chills Frequency: constant Pertinent Negatives: Patient denies sob, cp (without cough). 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   Reason for Disposition  [1] Continuous (nonstop) coughing interferes with work or school AND [2] no improvement using cough treatment per Care Advice  Answer Assessment - Initial Assessment Questions 1. ONSET: "When did the cough begin?"      Pain to right side of chest during coughing,  2. SEVERITY: "How bad is the cough today?"      Moderate to severe 3. SPUTUM: "Describe the color of your sputum" (none, dry cough; clear, white, yellow, green)     denies 4. HEMOPTYSIS: "Are you coughing up any blood?" If so ask: "How much?" (flecks, streaks, tablespoons, etc.)     denies 5. DIFFICULTY BREATHING: "Are you having difficulty breathing?" If Yes, ask: "How bad is it?" (e.g., mild, moderate, severe)    - MILD: No SOB at rest, mild SOB with walking, speaks normally in sentences, can lie down, no retractions, pulse < 100.    - MODERATE: SOB at rest, SOB with minimal exertion and prefers to sit, cannot lie down flat, speaks in phrases, mild retractions,  audible wheezing, pulse 100-120.    - SEVERE: Very SOB at rest, speaks in single words, struggling to breathe, sitting hunched forward, retractions, pulse > 120      Only during cough 6. FEVER: "Do you have a fever?" If Yes, ask: "What is your temperature, how was it measured, and when did it start?"     Chills, states he's buring up 7. CARDIAC HISTORY: "Do you have any history of heart disease?" (e.g., heart attack, congestive heart failure)      CABG x3 8. LUNG HISTORY: "Do you have any history of lung disease?"  (e.g., pulmonary embolus, asthma, emphysema)     denies 9. PE RISK FACTORS: "Do you have a history of blood clots?" (or: recent major surgery, recent prolonged travel, bedridden)     Denies 10. OTHER SYMPTOMS: "Do you have any other symptoms?" (e.g., runny nose, wheezing, chest pain)       Chest pain during cough, runny nose, wheezing 11. PREGNANCY: "Is there any chance you are pregnant?" "When was your last menstrual period?"       na 12. TRAVEL: "Have you traveled out of the country in the last month?" (e.g., travel history, exposures)       na  Protocols used: Cough - Acute Non-Productive-A-AH

## 2024-02-03 ENCOUNTER — Other Ambulatory Visit: Payer: Self-pay | Admitting: Physician Assistant

## 2024-02-09 ENCOUNTER — Other Ambulatory Visit: Payer: Self-pay | Admitting: Physician Assistant

## 2024-02-11 MED ORDER — PANTOPRAZOLE SODIUM 40 MG PO TBEC: 40.0000 mg | DELAYED_RELEASE_TABLET | Freq: Every day | ORAL | 0 refills | Status: AC

## 2024-03-07 ENCOUNTER — Other Ambulatory Visit: Payer: Self-pay | Admitting: Physician Assistant
